# Patient Record
Sex: Male | Born: 1940
Health system: Southern US, Community
[De-identification: ages and names within clinical notes are randomized; demographics above are authoritative.]

## PROBLEM LIST (undated history)

## (undated) DIAGNOSIS — I10 Essential (primary) hypertension: Secondary | ICD-10-CM

## (undated) DIAGNOSIS — M1990 Unspecified osteoarthritis, unspecified site: Secondary | ICD-10-CM

## (undated) DIAGNOSIS — E119 Type 2 diabetes mellitus without complications: Secondary | ICD-10-CM

## (undated) DIAGNOSIS — N289 Disorder of kidney and ureter, unspecified: Secondary | ICD-10-CM

## (undated) DIAGNOSIS — K760 Fatty (change of) liver, not elsewhere classified: Secondary | ICD-10-CM

## (undated) DIAGNOSIS — G1221 Amyotrophic lateral sclerosis: Secondary | ICD-10-CM

## (undated) DIAGNOSIS — E785 Hyperlipidemia, unspecified: Secondary | ICD-10-CM

## (undated) HISTORY — DX: Type 2 diabetes mellitus without complications: E11.9

## (undated) HISTORY — PX: EYE SURGERY: SHX253

## (undated) HISTORY — DX: Essential (primary) hypertension: I10

## (undated) HISTORY — DX: Hyperlipidemia, unspecified: E78.5

## (undated) HISTORY — PX: OTHER SURGICAL HISTORY: SHX169

## (undated) HISTORY — DX: Disorder of kidney and ureter, unspecified: N28.9

## (undated) HISTORY — PX: COLONOSCOPY W/ POLYPECTOMY: SHX1380

---
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
## 2015-04-02 LAB — HEMOGLOBIN A1C: Hgb A1c MFr Bld: 8.2 % — AB (ref 4.0–6.0)

## 2015-05-13 ENCOUNTER — Ambulatory Visit (INDEPENDENT_AMBULATORY_CARE_PROVIDER_SITE_OTHER): Payer: Medicare Other | Admitting: "Endocrinology

## 2015-05-13 ENCOUNTER — Encounter: Payer: Self-pay | Admitting: "Endocrinology

## 2015-05-13 VITALS — BP 142/82 | HR 64 | Ht 70.5 in | Wt 201.0 lb

## 2015-05-13 DIAGNOSIS — I10 Essential (primary) hypertension: Secondary | ICD-10-CM

## 2015-05-13 DIAGNOSIS — N183 Chronic kidney disease, stage 3 unspecified: Secondary | ICD-10-CM | POA: Insufficient documentation

## 2015-05-13 DIAGNOSIS — E785 Hyperlipidemia, unspecified: Secondary | ICD-10-CM

## 2015-05-13 DIAGNOSIS — E1122 Type 2 diabetes mellitus with diabetic chronic kidney disease: Secondary | ICD-10-CM | POA: Insufficient documentation

## 2015-05-13 DIAGNOSIS — E782 Mixed hyperlipidemia: Secondary | ICD-10-CM | POA: Insufficient documentation

## 2015-05-13 NOTE — Patient Instructions (Signed)

## 2015-05-13 NOTE — Progress Notes (Signed)
Subjective:    Patient ID: John Bishop, male    DOB: September 13, 1940. Patient is being seen in consultation for management of diabetes requested by  Celedonio Savage, MD  Past Medical History  Diagnosis Date  . Diabetes mellitus, type II ([place])   . Hypertension   . Hyperlipidemia   . Kidney disease    No past surgical history on file. Social History   Social History  . Marital Status: Married    Spouse Name: N/A  . Number of Children: N/A  . Years of Education: N/A   Social History Main Topics  . Smoking status: Former Research scientist (life sciences)  . Smokeless tobacco: Not on file  . Alcohol Use: No  . Drug Use: No  . Sexual Activity: Not on file   Other Topics Concern  . Not on file   Social History Narrative  . No narrative on file   Outpatient Encounter Prescriptions as of 05/13/2015  Medication Sig  . amLODipine (NORVASC) 5 MG tablet Take 5 mg by mouth daily.  Marland Kitchen aspirin 81 MG tablet Take 81 mg by mouth daily.  Marland Kitchen atorvastatin (LIPITOR) 10 MG tablet Take 10 mg by mouth daily.  . Cholecalciferol (VITAMIN D3) 2000 UNITS TABS Take by mouth.  . furosemide (LASIX) 20 MG tablet Take 20 mg by mouth daily.  . Insulin Glargine (LANTUS SOLOSTAR) 100 UNIT/ML Solostar Pen Inject 40 Units into the skin at bedtime.  Marland Kitchen losartan-hydrochlorothiazide (HYZAAR) 100-25 MG tablet Take 1 tablet by mouth daily.  . metoprolol tartrate (LOPRESSOR) 25 MG tablet Take 25 mg by mouth 2 (two) times daily.  . Omega-3 Fatty Acids (FISH OIL) 1200 MG CAPS Take by mouth.  . vitamin C (ASCORBIC ACID) 500 MG tablet Take 500 mg by mouth daily.   No facility-administered encounter medications on file as of 05/13/2015.   ALLERGIES: No Known Allergies VACCINATION STATUS:  There is no immunization history on file for this patient.  Diabetes He presents for his initial diabetic visit. He has type 2 diabetes mellitus. Onset time: He was diagnosed at approximate age of 23 years. His disease course has been worsening. There are no  hypoglycemic associated symptoms. Pertinent negatives for hypoglycemia include no confusion, headaches, pallor or seizures. Associated symptoms include polydipsia and polyuria. Pertinent negatives for diabetes include no chest pain, no fatigue, no polyphagia and no weakness. There are no hypoglycemic complications. Symptoms are worsening. Diabetic complications include nephropathy. Pertinent negatives for diabetic complications include no CVA, heart disease or retinopathy. Risk factors for coronary artery disease include diabetes mellitus, dyslipidemia, male sex and tobacco exposure. Current diabetic treatment includes insulin injections and oral agent (dual therapy). He is compliant with treatment most of the time. His weight is stable. He is following a generally healthy diet. He has not had a previous visit with a dietitian. He participates in exercise intermittently. Home blood sugar record trend: He did not bring any meter nor log to review. An ACE inhibitor/angiotensin II receptor blocker is being taken. Eye exam is current.  Hypertension This is a chronic problem. The current episode started more than 1 year ago. Pertinent negatives include no chest pain, headaches, neck pain, palpitations or shortness of breath. Risk factors for coronary artery disease include diabetes mellitus, dyslipidemia, male gender and smoking/tobacco exposure. Past treatments include angiotensin blockers. There is no history of CVA or retinopathy.  Hyperlipidemia This is a chronic problem. The current episode started more than 1 year ago. Pertinent negatives include no chest pain, myalgias or shortness  of breath. Current antihyperlipidemic treatment includes statins. Risk factors for coronary artery disease include dyslipidemia, diabetes mellitus, hypertension and male sex.     Review of Systems  Constitutional: Negative for fatigue and unexpected weight change.  HENT: Negative for dental problem, mouth sores and trouble  swallowing.   Eyes: Negative for visual disturbance.  Respiratory: Negative for cough, choking, chest tightness, shortness of breath and wheezing.   Cardiovascular: Negative for chest pain, palpitations and leg swelling.  Gastrointestinal: Negative for nausea, vomiting, abdominal pain, diarrhea, constipation and abdominal distention.  Endocrine: Positive for polydipsia and polyuria. Negative for polyphagia.  Genitourinary: Negative for dysuria, urgency, hematuria and flank pain.  Musculoskeletal: Negative for myalgias, back pain, gait problem and neck pain.  Skin: Negative for pallor, rash and wound.  Neurological: Negative for seizures, syncope, weakness, numbness and headaches.  Psychiatric/Behavioral: Negative.  Negative for confusion and dysphoric mood.    Objective:    BP 142/82 mmHg  Pulse 64  Ht 5' 10.5" (1.791 m)  Wt 201 lb (91.173 kg)  BMI 28.42 kg/m2  SpO2 100%  Wt Readings from Last 3 Encounters:  05/13/15 201 lb (91.173 kg)    Physical Exam  Constitutional: He is oriented to person, place, and time. He appears well-developed and well-nourished. He is cooperative. No distress.  HENT:  Head: Normocephalic and atraumatic.  Eyes: EOM are normal.  Neck: Normal range of motion. Neck supple. No tracheal deviation present. No thyromegaly present.  Cardiovascular: Normal rate, S1 normal, S2 normal and normal heart sounds.  Exam reveals no gallop.   No murmur heard. Pulses:      Dorsalis pedis pulses are 1+ on the right side, and 1+ on the left side.       Posterior tibial pulses are 1+ on the right side, and 1+ on the left side.  Pulmonary/Chest: Breath sounds normal. No respiratory distress. He has no wheezes.  Abdominal: Soft. Bowel sounds are normal. He exhibits no distension. There is no tenderness. There is no guarding and no CVA tenderness.  Musculoskeletal: He exhibits no edema.       Right shoulder: He exhibits no swelling and no deformity.  Neurological: He is alert  and oriented to person, place, and time. He has normal strength and normal reflexes. No cranial nerve deficit or sensory deficit. Gait normal.  Skin: Skin is warm and dry. No rash noted. No cyanosis. Nails show no clubbing.  Psychiatric: He has a normal mood and affect. His speech is normal and behavior is normal. Judgment and thought content normal. Cognition and memory are normal.    Results for orders placed or performed in visit on 05/13/15  Hemoglobin A1c  Result Value Ref Range   Hgb A1c MFr Bld 8.2 (A) 4.0 - 6.0 %   Complete Blood Count (Most recent): No results found for: WBC, HGB, HCT, MCV, PLT Chemistry (most recent): No results found for: NA, K, CL, CO2, BUN, CREATININE, GLUF Diabetic Labs (most recent): Lab Results  Component Value Date   HGBA1C 8.2* 04/02/2015   Lipid profile (most recent): No results found for: TRIG, CHOL       Assessment & Plan:   1. Diabetes mellitus with stage 3 chronic kidney disease ([place])  - Patient has currently uncontrolled symptomatic type 2 DM since  74 years of age,  with most recent A1c of 8.2 %. Recent labs reviewed.   -His diabetes is complicated by stage III CK D and patient remains at a high risk for more  acute and chronic complications of diabetes which include CAD, CVA, CKD, retinopathy, and neuropathy. These are all discussed in detail with the patient.  - I have counseled the patient on diet management and weight loss, by adopting a carbohydrate restricted/protein rich diet.  - Suggestion is made for patient to avoid simple carbohydrates   from their diet including Cakes , Desserts, Ice Cream,  Soda (  diet and regular) , Sweet Tea , Candies,  Chips, Cookies, Artificial Sweeteners,   and "Sugar-free" Products . This will help patient to have stable blood glucose profile and potentially avoid unintended weight gain.  - I encouraged the patient to switch to  unprocessed or minimally processed complex starch and increased protein  intake (animal or plant source), fruits, and vegetables.  - Patient is advised to stick to a routine mealtimes to eat 3 meals  a day and avoid unnecessary snacks ( to snack only to correct hypoglycemia).  - The patient will be scheduled with Jearld Fenton, RDN, CDE for individualized DM education.  - I have approached patient with the following individualized plan to manage diabetes and patient agrees:   - I  will proceed to increase his basal insulin Lantus to 40 units QHS,  associated with strict monitoring of glucose  AC and HS. -He may require bolus insulin depending on his blood glucose readings. -Patient is encouraged to call clinic for blood glucose levels less than 70 or above 300 mg /dl. -Patient is not a candidate for metformin andSGLT2 inhibitors due to CKD.  - Patient will be considered for incretin therapy as appropriate next visit. - Patient specific target  A1c;  LDL, HDL, Triglycerides, and  Waist Circumference were discussed in detail.  2) BP/HTN: uncontrolled. Continue current medications including ACEI/ARB. 3) Lipids/HPL:  Controlled unknown, continue statins. 4)  Weight/Diet: CDE Consult will be initiated , exercise, and detailed carbohydrates information provided.  5) Chronic Care/Health Maintenance:  -Patient is on ACEI/ARB and Statin medications and encouraged to continue to follow up with Ophthalmology, Podiatrist at least yearly or according to recommendations, and advised to   stay away from smoking. I have recommended yearly flu vaccine and pneumonia vaccination at least every 5 years; moderate intensity exercise for up to 150 minutes weekly; and  sleep for at least 7 hours a day.  - 60 minutes of time was spent on the care of this patient , 50% of which was applied for counseling on diabetes complications and their preventions.  - Patient to bring meter and  blood glucose logs during their next visit.   - I advised patient to maintain close follow up with  Celedonio Savage, MD for primary care needs.  Follow up plan: - Return in about 1 week (around 05/20/2015) for diabetes, high blood pressure, high cholesterol, follow up with meter and logs- no labs.  Glade Lloyd, MD Phone: [phone]  Fax: [phone]   05/13/2015, 7:30 PM

## 2015-05-20 ENCOUNTER — Encounter: Payer: Self-pay | Admitting: "Endocrinology

## 2015-05-20 ENCOUNTER — Ambulatory Visit (INDEPENDENT_AMBULATORY_CARE_PROVIDER_SITE_OTHER): Payer: Medicare Other | Admitting: "Endocrinology

## 2015-05-20 VITALS — BP 144/76 | HR 73 | Ht 70.5 in | Wt 201.0 lb

## 2015-05-20 DIAGNOSIS — N183 Chronic kidney disease, stage 3 unspecified: Secondary | ICD-10-CM

## 2015-05-20 DIAGNOSIS — I10 Essential (primary) hypertension: Secondary | ICD-10-CM | POA: Diagnosis not present

## 2015-05-20 DIAGNOSIS — E1122 Type 2 diabetes mellitus with diabetic chronic kidney disease: Secondary | ICD-10-CM

## 2015-05-20 DIAGNOSIS — E785 Hyperlipidemia, unspecified: Secondary | ICD-10-CM | POA: Diagnosis not present

## 2015-05-20 NOTE — Patient Instructions (Signed)

## 2015-05-20 NOTE — Progress Notes (Signed)
Subjective:    Patient ID: John Bishop, male    DOB: Oct 10, 1940. Patient is being seen in consultation for management of diabetes requested by  Celedonio Savage, MD  Past Medical History  Diagnosis Date  . Diabetes mellitus, type II ([place])   . Hypertension   . Hyperlipidemia   . Kidney disease    History reviewed. No pertinent past surgical history. Social History   Social History  . Marital Status: Married    Spouse Name: N/A  . Number of Children: N/A  . Years of Education: N/A   Social History Main Topics  . Smoking status: Former Research scientist (life sciences)  . Smokeless tobacco: None  . Alcohol Use: No  . Drug Use: No  . Sexual Activity: Not Asked   Other Topics Concern  . None   Social History Narrative   Outpatient Encounter Prescriptions as of 05/20/2015  Medication Sig  . amLODipine (NORVASC) 5 MG tablet Take 5 mg by mouth daily.  Marland Kitchen aspirin 81 MG tablet Take 81 mg by mouth daily.  Marland Kitchen atorvastatin (LIPITOR) 10 MG tablet Take 10 mg by mouth daily.  . Cholecalciferol (VITAMIN D3) 2000 UNITS TABS Take by mouth.  . furosemide (LASIX) 20 MG tablet Take 20 mg by mouth daily.  . Insulin Glargine (LANTUS SOLOSTAR) 100 UNIT/ML Solostar Pen Inject 40 Units into the skin at bedtime.  Marland Kitchen losartan-hydrochlorothiazide (HYZAAR) 100-25 MG tablet Take 1 tablet by mouth daily.  . metoprolol tartrate (LOPRESSOR) 25 MG tablet Take 25 mg by mouth 2 (two) times daily.  . Omega-3 Fatty Acids (FISH OIL) 1200 MG CAPS Take by mouth.  . vitamin C (ASCORBIC ACID) 500 MG tablet Take 500 mg by mouth daily.   No facility-administered encounter medications on file as of 05/20/2015.   ALLERGIES: No Known Allergies VACCINATION STATUS:  There is no immunization history on file for this patient.  Diabetes He presents for his follow-up diabetic visit. He has type 2 diabetes mellitus. Onset time: He was diagnosed at approximate age of 52 years. His disease course has been improving. There are no hypoglycemic  associated symptoms. Pertinent negatives for hypoglycemia include no confusion, headaches, pallor or seizures. Pertinent negatives for diabetes include no chest pain, no fatigue, no polydipsia, no polyphagia, no polyuria and no weakness. There are no hypoglycemic complications. Symptoms are improving. Diabetic complications include nephropathy. Pertinent negatives for diabetic complications include no CVA, heart disease or retinopathy. Risk factors for coronary artery disease include diabetes mellitus, dyslipidemia, male sex and tobacco exposure. Current diabetic treatment includes insulin injections and oral agent (dual therapy). He is compliant with treatment most of the time. His weight is stable. He is following a generally healthy diet. He has not had a previous visit with a dietitian. He participates in exercise intermittently. His breakfast blood glucose range is generally 140-180 mg/dl. His lunch blood glucose range is generally 140-180 mg/dl. His dinner blood glucose range is generally 140-180 mg/dl. His overall blood glucose range is 140-180 mg/dl. An ACE inhibitor/angiotensin II receptor blocker is being taken. Eye exam is current.  Hypertension This is a chronic problem. The current episode started more than 1 year ago. Pertinent negatives include no chest pain, headaches, neck pain, palpitations or shortness of breath. Risk factors for coronary artery disease include diabetes mellitus, dyslipidemia, male gender and smoking/tobacco exposure. Past treatments include angiotensin blockers. There is no history of CVA or retinopathy.  Hyperlipidemia This is a chronic problem. The current episode started more than 1 year ago. Pertinent  negatives include no chest pain, myalgias or shortness of breath. Current antihyperlipidemic treatment includes statins. Risk factors for coronary artery disease include dyslipidemia, diabetes mellitus, hypertension and male sex.     Review of Systems  Constitutional:  Negative for fatigue and unexpected weight change.  HENT: Negative for dental problem, mouth sores and trouble swallowing.   Eyes: Negative for visual disturbance.  Respiratory: Negative for cough, choking, chest tightness, shortness of breath and wheezing.   Cardiovascular: Negative for chest pain, palpitations and leg swelling.  Gastrointestinal: Negative for nausea, vomiting, abdominal pain, diarrhea, constipation and abdominal distention.  Endocrine: Negative for polydipsia, polyphagia and polyuria.  Genitourinary: Negative for dysuria, urgency, hematuria and flank pain.  Musculoskeletal: Negative for myalgias, back pain, gait problem and neck pain.  Skin: Negative for pallor, rash and wound.  Neurological: Negative for seizures, syncope, weakness, numbness and headaches.  Psychiatric/Behavioral: Negative.  Negative for confusion and dysphoric mood.    Objective:    BP 144/76 mmHg  Pulse 73  Ht 5' 10.5" (1.791 m)  Wt 201 lb (91.173 kg)  BMI 28.42 kg/m2  SpO2 99%  Wt Readings from Last 3 Encounters:  05/20/15 201 lb (91.173 kg)  05/13/15 201 lb (91.173 kg)    Physical Exam  Constitutional: He is oriented to person, place, and time. He appears well-developed and well-nourished. He is cooperative. No distress.  HENT:  Head: Normocephalic and atraumatic.  Eyes: EOM are normal.  Neck: Normal range of motion. Neck supple. No tracheal deviation present. No thyromegaly present.  Cardiovascular: Normal rate, S1 normal, S2 normal and normal heart sounds.  Exam reveals no gallop.   No murmur heard. Pulses:      Dorsalis pedis pulses are 1+ on the right side, and 1+ on the left side.       Posterior tibial pulses are 1+ on the right side, and 1+ on the left side.  Pulmonary/Chest: Breath sounds normal. No respiratory distress. He has no wheezes.  Abdominal: Soft. Bowel sounds are normal. He exhibits no distension. There is no tenderness. There is no guarding and no CVA tenderness.   Musculoskeletal: He exhibits no edema.       Right shoulder: He exhibits no swelling and no deformity.  Neurological: He is alert and oriented to person, place, and time. He has normal strength and normal reflexes. No cranial nerve deficit or sensory deficit. Gait normal.  Skin: Skin is warm and dry. No rash noted. No cyanosis. Nails show no clubbing.  Psychiatric: He has a normal mood and affect. His speech is normal and behavior is normal. Judgment and thought content normal. Cognition and memory are normal.    Results for orders placed or performed in visit on 05/13/15  Hemoglobin A1c  Result Value Ref Range   Hgb A1c MFr Bld 8.2 (A) 4.0 - 6.0 %   Complete Blood Count (Most recent): No results found for: WBC, HGB, HCT, MCV, PLT Chemistry (most recent): No results found for: NA, K, CL, CO2, BUN, CREATININE, GLUF Diabetic Labs (most recent): Lab Results  Component Value Date   HGBA1C 8.2* 04/02/2015    Assessment & Plan:   1. Diabetes mellitus with stage 3 chronic kidney disease ([place])  - Patient has currently uncontrolled symptomatic type 2 DM since  74 years of age,  with most recent A1c of 8.2 %.  -He came with near target blood glucose profile since last visit.  -His diabetes is complicated by stage III CK D and patient remains at  a high risk for more acute and chronic complications of diabetes which include CAD, CVA, CKD, retinopathy, and neuropathy. These are all discussed in detail with the patient.  - I have counseled the patient on diet management and weight loss, by adopting a carbohydrate restricted/protein rich diet.  - Suggestion is made for patient to avoid simple carbohydrates   from their diet including Cakes , Desserts, Ice Cream,  Soda (  diet and regular) , Sweet Tea , Candies,  Chips, Cookies, Artificial Sweeteners,   and "Sugar-free" Products . This will help patient to have stable blood glucose profile and potentially avoid unintended weight gain.  - I  encouraged the patient to switch to  unprocessed or minimally processed complex starch and increased protein intake (animal or plant source), fruits, and vegetables.  - Patient is advised to stick to a routine mealtimes to eat 3 meals  a day and avoid unnecessary snacks ( to snack only to correct hypoglycemia).  - The patient will be scheduled with Jearld Fenton, RDN, CDE for individualized DM education.  - I have approached patient with the following individualized plan to manage diabetes and patient agrees:   - I  will continue on basal insulin Lantus to 40 units QHS,  associated with strict monitoring of glucose  AC and HS. -He will not require bolus insulin depending on his blood glucose readings. -Patient is encouraged to call clinic for blood glucose levels less than 70 or above 300 mg /dl. -Patient is not a candidate for metformin andSGLT2 inhibitors due to CKD.  - Patient will be considered for incretin therapy as appropriate next visit. - Patient specific target  A1c;  LDL, HDL, Triglycerides, and  Waist Circumference were discussed in detail.  2) BP/HTN: uncontrolled. Continue current medications including ACEI/ARB. 3) Lipids/HPL:  Controlled unknown, continue statins. 4)  Weight/Diet: CDE Consult is  initiated , exercise, and detailed carbohydrates information provided.  5) Chronic Care/Health Maintenance:  -Patient is on ACEI/ARB and Statin medications and encouraged to continue to follow up with Ophthalmology, Podiatrist at least yearly or according to recommendations, and advised to   stay away from smoking. I have recommended yearly flu vaccine and pneumonia vaccination at least every 5 years; moderate intensity exercise for up to 150 minutes weekly; and  sleep for at least 7 hours a day.  -45 minutes of time was spent on the care of this patient , 50% of which was applied for counseling on diabetes complications and their preventions.  - Patient to bring meter and  blood  glucose logs during their next visit.   - I advised patient to maintain close follow up with Celedonio Savage, MD for primary care needs.  Follow up plan: - Return in about 10 weeks (around 07/29/2015) for diabetes, high blood pressure, high cholesterol, follow up with pre-visit labs, meter, and logs.  Glade Lloyd, MD Phone: [phone]  Fax: [phone]   05/20/2015, 4:51 PM

## 2015-05-21 ENCOUNTER — Encounter: Payer: Medicare Other | Attending: "Endocrinology | Admitting: *Deleted

## 2015-05-21 ENCOUNTER — Encounter: Payer: Self-pay | Admitting: *Deleted

## 2015-05-21 VITALS — Ht 70.0 in | Wt 203.0 lb

## 2015-05-21 DIAGNOSIS — N183 Chronic kidney disease, stage 3 unspecified: Secondary | ICD-10-CM

## 2015-05-21 DIAGNOSIS — E119 Type 2 diabetes mellitus without complications: Secondary | ICD-10-CM | POA: Diagnosis present

## 2015-05-21 DIAGNOSIS — E1122 Type 2 diabetes mellitus with diabetic chronic kidney disease: Secondary | ICD-10-CM

## 2015-05-21 NOTE — Patient Instructions (Addendum)
Plan:  Aim for 2-3 Carb Choices per meal (30-45 grams) +/- 1 either way  Include protein in moderation with your meals and snacks Consider reading food labels for Total Carbohydrate and Fat Grams of foods Continue your activity level  daily as tolerated Consider checking BG at alternate times per day to include fasting and 2 hours after a meal as directed by MD  Continue taking medication as directed by MD  Consider [employer] vs butter or margarine Try to balance your foods keeping in mind protein, fat and carbohydrates.... Don't let it get the best of you.Marland KitchenMarland KitchenBaby Steps  Always have with you: glucometer, water, recovery product, carb/protein snack

## 2015-05-21 NOTE — Progress Notes (Signed)
Diabetes Self-Management Education  Visit Type: First/Initial  Appt. Start Time: 1030 Appt. End Time: 1200  05/21/2015  John Bishop, identified by name and date of birth, is a 74 y.o. male with a diagnosis of Diabetes: Type 2. John Bishop presents with his wife [name]. John Bishop was seen by Dr. Dorris Fetch yesterday. 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 He and his wife remain very active through the [employer] and civic activities. John. [name] is highly motivated to remain in good health and make any beneficial behavior modifications recommended.  ASSESSMENT  Height 5\' 10"  (1.778 m), weight 203 lb (92.08 kg). Body mass index is 29.13 kg/(m^2).      Diabetes Self-Management Education - 05/21/15 1357    Visit Information   Visit Type First/Initial   Initial Visit   Diabetes Type Type 2   Are you currently following a meal plan? Yes   What type of meal plan do you follow? 3 meals per day no snacks   Are you taking your medications as prescribed? Yes   Health Coping   How would you rate your overall health? Good   Psychosocial Assessment   Patient Belief/Attitude about Diabetes Motivated to manage diabetes   Self-care barriers None   Self-management support Doctor's office;Family;CDE visits   Other persons present Patient;Spouse/SO   Patient Concerns Nutrition/Meal planning;Healthy Lifestyle;Glycemic Control   Special Needs None   Preferred Learning Style No preference indicated   Learning Readiness Change in progress   How often do you need to have someone help you when you read instructions, pamphlets, or other written materials from your doctor or pharmacy? 1 - Never   Complications   How often do you check your blood sugar? 1-2 times/day   Fasting Blood glucose range (mg/dL) 70-129;130-179  108-269   Postprandial Blood glucose range (mg/dL) 70-129;130-179  88-16   Number of hypoglycemic episodes per month 0   Number of hyperglycemic episodes per week 1   Have you  had a dilated eye exam in the past 12 months? Yes   Have you had a dental exam in the past 12 months? Yes   Are you checking your feet? Yes   How many days per week are you checking your feet? 7   Dietary Intake   Breakfast 2 eggs, 2 bacon, coffee with whole milk   Exercise   Exercise Type Moderate (swimming / aerobic walking);Light (walking / raking leaves)   How many days per week to you exercise? 5   How many minutes per day do you exercise? 60   Total minutes per week of exercise 300   Patient Education   Previous Diabetes Education Yes (please comment)  16 years ago at time of diagnosis   Disease state  Factors that contribute to the development of diabetes   Nutrition management  Role of diet in the treatment of diabetes and the relationship between the three main macronutrients and blood glucose level;Food label reading, portion sizes and measuring food.;Carbohydrate counting;Reviewed blood glucose goals for pre and post meals and how to evaluate the patients' food intake on their blood glucose level.;Information on hints to eating out and maintain blood glucose control.;Meal options for control of blood glucose level and chronic complications.   Physical activity and exercise  Role of exercise on diabetes management, blood pressure control and cardiac health.   Medications Reviewed patients medication for diabetes, action, purpose, timing of dose and side effects.   Monitoring Purpose  and frequency of SMBG.   Acute complications Taught treatment of hypoglycemia - the 15 rule.   Chronic complications Relationship between chronic complications and blood glucose control;Assessed and discussed foot care and prevention of foot problems;Dental care;Retinopathy and reason for yearly dilated eye exams;Lipid levels, blood glucose control and heart disease   Psychosocial adjustment Identified and addressed patients feelings and concerns about diabetes   Personal strategies to promote health  Lifestyle issues that need to be addressed for better diabetes care   Individualized Goals (developed by patient)   Nutrition General guidelines for healthy choices and portions discussed   Physical Activity Exercise 5-7 days per week;30 minutes per day   Medications take my medication as prescribed   Monitoring  test blood glucose pre and post meals as discussed  alternate FBS and 2hpp   Reducing Risk do foot checks daily   Outcomes   Expected Outcomes Demonstrated interest in learning. Expect positive outcomes   Future DMSE PRN;Other (comment)  Any follow up will be with Jearld Fenton RD, CDE with Dr. Dorris Fetch   Program Status Completed      Individualized Plan for Diabetes Self-Management Training:   Learning Objective:  Patient will have a greater understanding of diabetes self-management. Patient education plan is to attend individual and/or group sessions per assessed needs and concerns.   Plan:   Patient Instructions  Plan:  Aim for 2-3 Carb Choices per meal (30-45 grams) +/- 1 either way  Include protein in moderation with your meals and snacks Consider reading food labels for Total Carbohydrate and Fat Grams of foods Continue your activity level  daily as tolerated Consider checking BG at alternate times per day to include fasting and 2 hours after a meal as directed by MD  Continue taking medication as directed by MD  Consider [employer] vs butter or margarine Try to balance your foods keeping in mind protein, fat and carbohydrates.... Don't let it get the best of you.Marland KitchenMarland KitchenBaby Steps  Always have with you: glucometer, water, recovery product, carb/protein snack   Expected Outcomes:  Demonstrated interest in learning. Expect positive outcomes  Education material provided: Living Well with Diabetes, Meal plan card and My Plate, Choose a Meal [facility])  If problems or questions, patient to contact team via:  Phone  Future DSME appointment: PRN,  Other (comment) (Any follow up will be with Jearld Fenton RD, CDE with Dr. Dorris Fetch)

## 2015-06-11 DIAGNOSIS — M25652 Stiffness of left hip, not elsewhere classified: Secondary | ICD-10-CM | POA: Diagnosis not present

## 2015-06-11 DIAGNOSIS — M25452 Effusion, left hip: Secondary | ICD-10-CM | POA: Diagnosis not present

## 2015-06-11 DIAGNOSIS — M25551 Pain in right hip: Secondary | ICD-10-CM | POA: Diagnosis not present

## 2015-06-11 DIAGNOSIS — M25552 Pain in left hip: Secondary | ICD-10-CM | POA: Diagnosis not present

## 2015-06-11 DIAGNOSIS — M62838 Other muscle spasm: Secondary | ICD-10-CM | POA: Diagnosis not present

## 2015-06-11 DIAGNOSIS — M545 Low back pain: Secondary | ICD-10-CM | POA: Diagnosis not present

## 2015-06-13 DIAGNOSIS — M25452 Effusion, left hip: Secondary | ICD-10-CM | POA: Diagnosis not present

## 2015-06-13 DIAGNOSIS — M25552 Pain in left hip: Secondary | ICD-10-CM | POA: Diagnosis not present

## 2015-06-13 DIAGNOSIS — M62838 Other muscle spasm: Secondary | ICD-10-CM | POA: Diagnosis not present

## 2015-06-13 DIAGNOSIS — M25551 Pain in right hip: Secondary | ICD-10-CM | POA: Diagnosis not present

## 2015-06-13 DIAGNOSIS — M545 Low back pain: Secondary | ICD-10-CM | POA: Diagnosis not present

## 2015-06-13 DIAGNOSIS — M25652 Stiffness of left hip, not elsewhere classified: Secondary | ICD-10-CM | POA: Diagnosis not present

## 2015-06-18 DIAGNOSIS — M25452 Effusion, left hip: Secondary | ICD-10-CM | POA: Diagnosis not present

## 2015-06-18 DIAGNOSIS — M545 Low back pain: Secondary | ICD-10-CM | POA: Diagnosis not present

## 2015-06-18 DIAGNOSIS — M25652 Stiffness of left hip, not elsewhere classified: Secondary | ICD-10-CM | POA: Diagnosis not present

## 2015-06-18 DIAGNOSIS — M62838 Other muscle spasm: Secondary | ICD-10-CM | POA: Diagnosis not present

## 2015-06-18 DIAGNOSIS — M25552 Pain in left hip: Secondary | ICD-10-CM | POA: Diagnosis not present

## 2015-06-18 DIAGNOSIS — M25551 Pain in right hip: Secondary | ICD-10-CM | POA: Diagnosis not present

## 2015-06-20 DIAGNOSIS — M545 Low back pain: Secondary | ICD-10-CM | POA: Diagnosis not present

## 2015-06-20 DIAGNOSIS — M25552 Pain in left hip: Secondary | ICD-10-CM | POA: Diagnosis not present

## 2015-06-20 DIAGNOSIS — M25551 Pain in right hip: Secondary | ICD-10-CM | POA: Diagnosis not present

## 2015-06-20 DIAGNOSIS — M25652 Stiffness of left hip, not elsewhere classified: Secondary | ICD-10-CM | POA: Diagnosis not present

## 2015-06-20 DIAGNOSIS — M25452 Effusion, left hip: Secondary | ICD-10-CM | POA: Diagnosis not present

## 2015-06-20 DIAGNOSIS — M62838 Other muscle spasm: Secondary | ICD-10-CM | POA: Diagnosis not present

## 2015-06-25 DIAGNOSIS — M25652 Stiffness of left hip, not elsewhere classified: Secondary | ICD-10-CM | POA: Diagnosis not present

## 2015-06-25 DIAGNOSIS — M25551 Pain in right hip: Secondary | ICD-10-CM | POA: Diagnosis not present

## 2015-06-25 DIAGNOSIS — M25452 Effusion, left hip: Secondary | ICD-10-CM | POA: Diagnosis not present

## 2015-06-25 DIAGNOSIS — M62838 Other muscle spasm: Secondary | ICD-10-CM | POA: Diagnosis not present

## 2015-06-25 DIAGNOSIS — M25552 Pain in left hip: Secondary | ICD-10-CM | POA: Diagnosis not present

## 2015-06-25 DIAGNOSIS — M545 Low back pain: Secondary | ICD-10-CM | POA: Diagnosis not present

## 2015-06-27 DIAGNOSIS — M62838 Other muscle spasm: Secondary | ICD-10-CM | POA: Diagnosis not present

## 2015-06-27 DIAGNOSIS — M25652 Stiffness of left hip, not elsewhere classified: Secondary | ICD-10-CM | POA: Diagnosis not present

## 2015-06-27 DIAGNOSIS — M25552 Pain in left hip: Secondary | ICD-10-CM | POA: Diagnosis not present

## 2015-06-27 DIAGNOSIS — M545 Low back pain: Secondary | ICD-10-CM | POA: Diagnosis not present

## 2015-06-27 DIAGNOSIS — M25452 Effusion, left hip: Secondary | ICD-10-CM | POA: Diagnosis not present

## 2015-06-27 DIAGNOSIS — M25551 Pain in right hip: Secondary | ICD-10-CM | POA: Diagnosis not present

## 2015-07-02 DIAGNOSIS — M25551 Pain in right hip: Secondary | ICD-10-CM | POA: Diagnosis not present

## 2015-07-02 DIAGNOSIS — M25652 Stiffness of left hip, not elsewhere classified: Secondary | ICD-10-CM | POA: Diagnosis not present

## 2015-07-02 DIAGNOSIS — M25452 Effusion, left hip: Secondary | ICD-10-CM | POA: Diagnosis not present

## 2015-07-02 DIAGNOSIS — M545 Low back pain: Secondary | ICD-10-CM | POA: Diagnosis not present

## 2015-07-02 DIAGNOSIS — M62838 Other muscle spasm: Secondary | ICD-10-CM | POA: Diagnosis not present

## 2015-07-02 DIAGNOSIS — M25552 Pain in left hip: Secondary | ICD-10-CM | POA: Diagnosis not present

## 2015-07-04 DIAGNOSIS — M545 Low back pain: Secondary | ICD-10-CM | POA: Diagnosis not present

## 2015-07-04 DIAGNOSIS — M25552 Pain in left hip: Secondary | ICD-10-CM | POA: Diagnosis not present

## 2015-07-04 DIAGNOSIS — M62838 Other muscle spasm: Secondary | ICD-10-CM | POA: Diagnosis not present

## 2015-07-04 DIAGNOSIS — M25551 Pain in right hip: Secondary | ICD-10-CM | POA: Diagnosis not present

## 2015-07-04 DIAGNOSIS — M25452 Effusion, left hip: Secondary | ICD-10-CM | POA: Diagnosis not present

## 2015-07-04 DIAGNOSIS — M25652 Stiffness of left hip, not elsewhere classified: Secondary | ICD-10-CM | POA: Diagnosis not present

## 2015-07-08 DIAGNOSIS — M25552 Pain in left hip: Secondary | ICD-10-CM | POA: Diagnosis not present

## 2015-07-08 DIAGNOSIS — M62838 Other muscle spasm: Secondary | ICD-10-CM | POA: Diagnosis not present

## 2015-07-08 DIAGNOSIS — M25551 Pain in right hip: Secondary | ICD-10-CM | POA: Diagnosis not present

## 2015-07-08 DIAGNOSIS — M25452 Effusion, left hip: Secondary | ICD-10-CM | POA: Diagnosis not present

## 2015-07-08 DIAGNOSIS — M545 Low back pain: Secondary | ICD-10-CM | POA: Diagnosis not present

## 2015-07-08 DIAGNOSIS — M25652 Stiffness of left hip, not elsewhere classified: Secondary | ICD-10-CM | POA: Diagnosis not present

## 2015-07-10 DIAGNOSIS — M25452 Effusion, left hip: Secondary | ICD-10-CM | POA: Diagnosis not present

## 2015-07-10 DIAGNOSIS — M25552 Pain in left hip: Secondary | ICD-10-CM | POA: Diagnosis not present

## 2015-07-10 DIAGNOSIS — M25551 Pain in right hip: Secondary | ICD-10-CM | POA: Diagnosis not present

## 2015-07-10 DIAGNOSIS — M25652 Stiffness of left hip, not elsewhere classified: Secondary | ICD-10-CM | POA: Diagnosis not present

## 2015-07-10 DIAGNOSIS — M62838 Other muscle spasm: Secondary | ICD-10-CM | POA: Diagnosis not present

## 2015-07-10 DIAGNOSIS — M545 Low back pain: Secondary | ICD-10-CM | POA: Diagnosis not present

## 2015-07-15 DIAGNOSIS — M545 Low back pain: Secondary | ICD-10-CM | POA: Diagnosis not present

## 2015-07-15 DIAGNOSIS — M25551 Pain in right hip: Secondary | ICD-10-CM | POA: Diagnosis not present

## 2015-07-15 DIAGNOSIS — M25452 Effusion, left hip: Secondary | ICD-10-CM | POA: Diagnosis not present

## 2015-07-15 DIAGNOSIS — M62838 Other muscle spasm: Secondary | ICD-10-CM | POA: Diagnosis not present

## 2015-07-15 DIAGNOSIS — M25552 Pain in left hip: Secondary | ICD-10-CM | POA: Diagnosis not present

## 2015-07-15 DIAGNOSIS — M25652 Stiffness of left hip, not elsewhere classified: Secondary | ICD-10-CM | POA: Diagnosis not present

## 2015-07-17 DIAGNOSIS — M25452 Effusion, left hip: Secondary | ICD-10-CM | POA: Diagnosis not present

## 2015-07-17 DIAGNOSIS — M25552 Pain in left hip: Secondary | ICD-10-CM | POA: Diagnosis not present

## 2015-07-17 DIAGNOSIS — M545 Low back pain: Secondary | ICD-10-CM | POA: Diagnosis not present

## 2015-07-17 DIAGNOSIS — M25652 Stiffness of left hip, not elsewhere classified: Secondary | ICD-10-CM | POA: Diagnosis not present

## 2015-07-17 DIAGNOSIS — M25551 Pain in right hip: Secondary | ICD-10-CM | POA: Diagnosis not present

## 2015-07-17 DIAGNOSIS — M62838 Other muscle spasm: Secondary | ICD-10-CM | POA: Diagnosis not present

## 2015-07-24 DIAGNOSIS — N183 Chronic kidney disease, stage 3 (moderate): Secondary | ICD-10-CM | POA: Diagnosis not present

## 2015-07-24 DIAGNOSIS — E1122 Type 2 diabetes mellitus with diabetic chronic kidney disease: Secondary | ICD-10-CM | POA: Diagnosis not present

## 2015-07-24 LAB — HEMOGLOBIN A1C: HEMOGLOBIN A1C: 8

## 2015-07-31 ENCOUNTER — Ambulatory Visit (INDEPENDENT_AMBULATORY_CARE_PROVIDER_SITE_OTHER): Payer: Medicare Other | Admitting: "Endocrinology

## 2015-07-31 ENCOUNTER — Encounter: Payer: Self-pay | Admitting: "Endocrinology

## 2015-07-31 VITALS — BP 135/79 | HR 70 | Ht 70.0 in | Wt 204.0 lb

## 2015-07-31 DIAGNOSIS — N183 Chronic kidney disease, stage 3 unspecified: Secondary | ICD-10-CM

## 2015-07-31 DIAGNOSIS — I10 Essential (primary) hypertension: Secondary | ICD-10-CM | POA: Diagnosis not present

## 2015-07-31 DIAGNOSIS — E1122 Type 2 diabetes mellitus with diabetic chronic kidney disease: Secondary | ICD-10-CM

## 2015-07-31 DIAGNOSIS — E785 Hyperlipidemia, unspecified: Secondary | ICD-10-CM | POA: Diagnosis not present

## 2015-07-31 MED ORDER — SITAGLIPTIN PHOSPHATE 25 MG PO TABS: 25.0000 mg | ORAL_TABLET | Freq: Every day | ORAL | Status: DC

## 2015-07-31 NOTE — Progress Notes (Signed)
Subjective:    Patient ID: John Bishop, male    DOB: 09-11-1940. Patient is here to follow-up for management of diabetes requested by  Celedonio Savage, MD  Past Medical History  Diagnosis Date  . Diabetes mellitus, type II ([place])   . Hypertension   . Hyperlipidemia   . Kidney disease    Past Surgical History  Procedure Laterality Date  . Eye surgery     Social History   Social History  . Marital Status: Married    Spouse Name: N/A  . Number of Children: N/A  . Years of Education: N/A   Social History Main Topics  . Smoking status: Former Research scientist (life sciences)  . Smokeless tobacco: None  . Alcohol Use: No  . Drug Use: No  . Sexual Activity: Not Asked   Other Topics Concern  . None   Social History Narrative   Outpatient Encounter Prescriptions as of 07/31/2015  Medication Sig  . amLODipine (NORVASC) 5 MG tablet Take 5 mg by mouth daily.  Marland Kitchen aspirin 81 MG tablet Take 81 mg by mouth daily.  Marland Kitchen atorvastatin (LIPITOR) 10 MG tablet Take 10 mg by mouth daily.  . Cholecalciferol (VITAMIN D3) 2000 UNITS TABS Take by mouth.  . furosemide (LASIX) 20 MG tablet Take 20 mg by mouth daily.  . Insulin Glargine (LANTUS SOLOSTAR) 100 UNIT/ML Solostar Pen Inject 46 Units into the skin at bedtime.  Marland Kitchen losartan-hydrochlorothiazide (HYZAAR) 100-25 MG tablet Take 1 tablet by mouth daily.  . metoprolol tartrate (LOPRESSOR) 25 MG tablet Take 25 mg by mouth 2 (two) times daily.  . Omega-3 Fatty Acids (FISH OIL) 1200 MG CAPS Take by mouth.  . vitamin C (ASCORBIC ACID) 500 MG tablet Take 500 mg by mouth daily.  . sitaGLIPtin (JANUVIA) 25 MG tablet Take 1 tablet (25 mg total) by mouth daily.   No facility-administered encounter medications on file as of 07/31/2015.   ALLERGIES: No Known Allergies VACCINATION STATUS:  There is no immunization history on file for this patient.  Diabetes He presents for his follow-up diabetic visit. He has type 2 diabetes mellitus. Onset time: He was diagnosed at approximate  age of 50 years. His disease course has been improving. There are no hypoglycemic associated symptoms. Pertinent negatives for hypoglycemia include no confusion, headaches, pallor or seizures. Pertinent negatives for diabetes include no chest pain, no fatigue, no polydipsia, no polyphagia, no polyuria and no weakness. There are no hypoglycemic complications. Symptoms are improving. Diabetic complications include nephropathy. Pertinent negatives for diabetic complications include no CVA, heart disease or retinopathy. Risk factors for coronary artery disease include diabetes mellitus, dyslipidemia, male sex and tobacco exposure. Current diabetic treatment includes insulin injections and oral agent (dual therapy). He is compliant with treatment most of the time. His weight is stable. He is following a generally healthy diet. He has not had a previous visit with a dietitian. He participates in exercise intermittently. His breakfast blood glucose range is generally 140-180 mg/dl. An ACE inhibitor/angiotensin II receptor blocker is being taken. Eye exam is current.  Hypertension This is a chronic problem. The current episode started more than 1 year ago. Pertinent negatives include no chest pain, headaches, neck pain, palpitations or shortness of breath. Risk factors for coronary artery disease include diabetes mellitus, dyslipidemia, male gender and smoking/tobacco exposure. Past treatments include angiotensin blockers. There is no history of CVA or retinopathy.  Hyperlipidemia This is a chronic problem. The current episode started more than 1 year ago. Pertinent negatives include no  chest pain, myalgias or shortness of breath. Current antihyperlipidemic treatment includes statins. Risk factors for coronary artery disease include dyslipidemia, diabetes mellitus, hypertension and male sex.     Review of Systems  Constitutional: Negative for fatigue and unexpected weight change.  HENT: Negative for dental  problem, mouth sores and trouble swallowing.   Eyes: Negative for visual disturbance.  Respiratory: Negative for cough, choking, chest tightness, shortness of breath and wheezing.   Cardiovascular: Negative for chest pain, palpitations and leg swelling.  Gastrointestinal: Negative for nausea, vomiting, abdominal pain, diarrhea, constipation and abdominal distention.  Endocrine: Negative for polydipsia, polyphagia and polyuria.  Genitourinary: Negative for dysuria, urgency, hematuria and flank pain.  Musculoskeletal: Negative for myalgias, back pain, gait problem and neck pain.  Skin: Negative for pallor, rash and wound.  Neurological: Negative for seizures, syncope, weakness, numbness and headaches.  Psychiatric/Behavioral: Negative.  Negative for confusion and dysphoric mood.    Objective:    BP 135/79 mmHg  Pulse 70  Ht 5\' 10"  (1.778 m)  Wt 204 lb (92.534 kg)  BMI 29.27 kg/m2  SpO2 98%  Wt Readings from Last 3 Encounters:  07/31/15 204 lb (92.534 kg)  05/21/15 203 lb (92.08 kg)  05/20/15 201 lb (91.173 kg)    Physical Exam  Constitutional: He is oriented to person, place, and time. He appears well-developed and well-nourished. He is cooperative. No distress.  HENT:  Head: Normocephalic and atraumatic.  Eyes: EOM are normal.  Neck: Normal range of motion. Neck supple. No tracheal deviation present. No thyromegaly present.  Cardiovascular: Normal rate, S1 normal, S2 normal and normal heart sounds.  Exam reveals no gallop.   No murmur heard. Pulses:      Dorsalis pedis pulses are 1+ on the right side, and 1+ on the left side.       Posterior tibial pulses are 1+ on the right side, and 1+ on the left side.  Pulmonary/Chest: Breath sounds normal. No respiratory distress. He has no wheezes.  Abdominal: Soft. Bowel sounds are normal. He exhibits no distension. There is no tenderness. There is no guarding and no CVA tenderness.  Musculoskeletal: He exhibits no edema.       Right  shoulder: He exhibits no swelling and no deformity.  Neurological: He is alert and oriented to person, place, and time. He has normal strength and normal reflexes. No cranial nerve deficit or sensory deficit. Gait normal.  Skin: Skin is warm and dry. No rash noted. No cyanosis. Nails show no clubbing.  Psychiatric: He has a normal mood and affect. His speech is normal and behavior is normal. Judgment and thought content normal. Cognition and memory are normal.     Diabetic Labs (most recent): Lab Results  Component Value Date   HGBA1C 8 07/24/2015   HGBA1C 8.2* 04/02/2015    Completed labs from 07/24/2015 to be scanned into his records.  Assessment & Plan:   1. Diabetes mellitus with stage 3 chronic kidney disease ([place])  - Patient has currently uncontrolled symptomatic type 2 DM since  75 years of age,  with most recent A1c of 8 %.  -He came with near target blood glucose profile since last visit.  -His diabetes is complicated by stage 3 CK D and patient remains at a high risk for more acute and chronic complications of diabetes which include CAD, CVA, CKD, retinopathy, and neuropathy. These are all discussed in detail with the patient.  - I have counseled the patient on diet management and weight  loss, by adopting a carbohydrate restricted/protein rich diet.  - Suggestion is made for patient to avoid simple carbohydrates   from their diet including Cakes , Desserts, Ice Cream,  Soda (  diet and regular) , Sweet Tea , Candies,  Chips, Cookies, Artificial Sweeteners,   and "Sugar-free" Products . This will help patient to have stable blood glucose profile and potentially avoid unintended weight gain.  - I encouraged the patient to switch to  unprocessed or minimally processed complex starch and increased protein intake (animal or plant source), fruits, and vegetables.  - Patient is advised to stick to a routine mealtimes to eat 3 meals  a day and avoid unnecessary snacks ( to snack only  to correct hypoglycemia).  - The patient will be scheduled with Jearld Fenton, RDN, CDE for individualized DM education.  - I have approached patient with the following individualized plan to manage diabetes and patient agrees:   - I  will increase basal insulin Lantus to 44 units QHS,  associated with strict monitoring of glucose  AC breakfast . -I will add Januvia 25 mg by mouth every morning. -He will not require bolus insulin depending on his blood glucose readings. -Patient is encouraged to call clinic for blood glucose levels less than 70 or above 300 mg /dl. -Patient is not a candidate for metformin andSGLT2 inhibitors due to CKD.  - Patient will be considered for incretin therapy as appropriate next visit. - Patient specific target  A1c;  LDL, HDL, Triglycerides, and  Waist Circumference were discussed in detail.  2) BP/HTN: uncontrolled. Continue current medications including ACEI/ARB. 3) Lipids/HPL:  Controlled unknown, continue statins. 4)  Weight/Diet: CDE Consult is  initiated , exercise, and detailed carbohydrates information provided.  5) Chronic Care/Health Maintenance:  -Patient is on ACEI/ARB and Statin medications and encouraged to continue to follow up with Ophthalmology, Podiatrist at least yearly or according to recommendations, and advised to   stay away from smoking. I have recommended yearly flu vaccine and pneumonia vaccination at least every 5 years; moderate intensity exercise for up to 150 minutes weekly; and  sleep for at least 7 hours a day.  -25 minutes of time was spent on the care of this patient , 50% of which was applied for counseling on diabetes complications and their preventions.  - Patient to bring meter and  blood glucose logs during their next visit.   - I advised patient to maintain close follow up with Celedonio Savage, MD for primary care needs.  Follow up plan: - Return in about 3 months (around 10/28/2015) for diabetes, high blood pressure,  high cholesterol, follow up with pre-visit labs, meter, and logs.  Glade Lloyd, MD Phone: [phone]  Fax: [phone]   07/31/2015, 10:55 AM

## 2015-07-31 NOTE — Patient Instructions (Signed)

## 2015-08-05 ENCOUNTER — Other Ambulatory Visit: Payer: Self-pay

## 2015-08-05 MED ORDER — PEN NEEDLES 31G X 8 MM MISC: Status: DC

## 2015-08-05 MED ORDER — INSULIN GLARGINE 100 UNIT/ML SOLOSTAR PEN: 44.0000 [IU] | PEN_INJECTOR | Freq: Every day | SUBCUTANEOUS | Status: DC

## 2015-08-19 DIAGNOSIS — M25551 Pain in right hip: Secondary | ICD-10-CM | POA: Diagnosis not present

## 2015-08-19 DIAGNOSIS — G8929 Other chronic pain: Secondary | ICD-10-CM | POA: Diagnosis not present

## 2015-08-19 DIAGNOSIS — M25571 Pain in right ankle and joints of right foot: Secondary | ICD-10-CM | POA: Diagnosis not present

## 2015-08-19 DIAGNOSIS — M25552 Pain in left hip: Secondary | ICD-10-CM | POA: Diagnosis not present

## 2015-08-19 DIAGNOSIS — M160 Bilateral primary osteoarthritis of hip: Secondary | ICD-10-CM | POA: Diagnosis not present

## 2015-10-01 DIAGNOSIS — E1122 Type 2 diabetes mellitus with diabetic chronic kidney disease: Secondary | ICD-10-CM | POA: Diagnosis not present

## 2015-10-01 DIAGNOSIS — D649 Anemia, unspecified: Secondary | ICD-10-CM | POA: Diagnosis not present

## 2015-10-01 DIAGNOSIS — I129 Hypertensive chronic kidney disease with stage 1 through stage 4 chronic kidney disease, or unspecified chronic kidney disease: Secondary | ICD-10-CM | POA: Diagnosis not present

## 2015-10-01 DIAGNOSIS — Z7982 Long term (current) use of aspirin: Secondary | ICD-10-CM | POA: Diagnosis not present

## 2015-10-01 DIAGNOSIS — N183 Chronic kidney disease, stage 3 (moderate): Secondary | ICD-10-CM | POA: Diagnosis not present

## 2015-10-01 DIAGNOSIS — Z79899 Other long term (current) drug therapy: Secondary | ICD-10-CM | POA: Diagnosis not present

## 2015-10-01 DIAGNOSIS — Z794 Long term (current) use of insulin: Secondary | ICD-10-CM | POA: Diagnosis not present

## 2015-10-15 DIAGNOSIS — E119 Type 2 diabetes mellitus without complications: Secondary | ICD-10-CM | POA: Diagnosis not present

## 2015-10-15 DIAGNOSIS — H40013 Open angle with borderline findings, low risk, bilateral: Secondary | ICD-10-CM | POA: Diagnosis not present

## 2015-10-23 DIAGNOSIS — N183 Chronic kidney disease, stage 3 (moderate): Secondary | ICD-10-CM | POA: Diagnosis not present

## 2015-10-23 DIAGNOSIS — E1122 Type 2 diabetes mellitus with diabetic chronic kidney disease: Secondary | ICD-10-CM | POA: Diagnosis not present

## 2015-10-24 ENCOUNTER — Other Ambulatory Visit: Payer: Self-pay | Admitting: "Endocrinology

## 2015-10-30 ENCOUNTER — Ambulatory Visit (INDEPENDENT_AMBULATORY_CARE_PROVIDER_SITE_OTHER): Payer: Medicare Other | Admitting: "Endocrinology

## 2015-10-30 ENCOUNTER — Encounter: Payer: Self-pay | Admitting: "Endocrinology

## 2015-10-30 VITALS — BP 138/78 | HR 60 | Ht 70.0 in | Wt 205.0 lb

## 2015-10-30 DIAGNOSIS — I10 Essential (primary) hypertension: Secondary | ICD-10-CM

## 2015-10-30 DIAGNOSIS — E785 Hyperlipidemia, unspecified: Secondary | ICD-10-CM

## 2015-10-30 DIAGNOSIS — N183 Chronic kidney disease, stage 3 unspecified: Secondary | ICD-10-CM

## 2015-10-30 DIAGNOSIS — E1122 Type 2 diabetes mellitus with diabetic chronic kidney disease: Secondary | ICD-10-CM | POA: Diagnosis not present

## 2015-10-30 NOTE — Patient Instructions (Signed)

## 2015-10-30 NOTE — Progress Notes (Signed)
Subjective:    Patient ID: John Bishop, male    DOB: [birth year]. Patient is here to follow-up for management of diabetes requested by  Celedonio Savage, MD  Past Medical History  Diagnosis Date  . Diabetes mellitus, type II ([place])   . Hypertension   . Hyperlipidemia   . Kidney disease    Past Surgical History  Procedure Laterality Date  . Eye surgery     Social History   Social History  . Marital Status: Married    Spouse Name: N/A  . Number of Children: N/A  . Years of Education: N/A   Social History Main Topics  . Smoking status: Former Research scientist (life sciences)  . Smokeless tobacco: None  . Alcohol Use: No  . Drug Use: No  . Sexual Activity: Not Asked   Other Topics Concern  . None   Social History Narrative   Outpatient Encounter Prescriptions as of 10/30/2015  Medication Sig  . amLODipine (NORVASC) 5 MG tablet Take 5 mg by mouth daily.  Marland Kitchen aspirin 81 MG tablet Take 81 mg by mouth daily.  Marland Kitchen atorvastatin (LIPITOR) 10 MG tablet Take 10 mg by mouth daily.  . Cholecalciferol (VITAMIN D3) 2000 UNITS TABS Take by mouth.  . furosemide (LASIX) 20 MG tablet Take 20 mg by mouth daily.  . Insulin Glargine (LANTUS SOLOSTAR) 100 UNIT/ML Solostar Pen Inject 44 Units into the skin at bedtime.  . Insulin Pen Needle (PEN NEEDLES) 31G X 8 MM MISC Use qhs  . JANUVIA 25 MG tablet TAKE 1 TABLET BY MOUTH EVERY DAY  . losartan-hydrochlorothiazide (HYZAAR) 100-25 MG tablet Take 1 tablet by mouth daily.  . metoprolol tartrate (LOPRESSOR) 25 MG tablet Take 25 mg by mouth 2 (two) times daily.  . Omega-3 Fatty Acids (FISH OIL) 1200 MG CAPS Take by mouth.  . vitamin C (ASCORBIC ACID) 500 MG tablet Take 500 mg by mouth daily.   No facility-administered encounter medications on file as of 10/30/2015.   ALLERGIES: No Known Allergies VACCINATION STATUS:  There is no immunization history on file for this patient.  Diabetes He presents for his follow-up diabetic visit. He has type 2 diabetes mellitus. Onset  time: He was diagnosed at approximate age of 45 years. His disease course has been improving. There are no hypoglycemic associated symptoms. Pertinent negatives for hypoglycemia include no confusion, headaches, pallor or seizures. Pertinent negatives for diabetes include no chest pain, no fatigue, no polydipsia, no polyphagia, no polyuria and no weakness. There are no hypoglycemic complications. Symptoms are improving. Diabetic complications include nephropathy. Pertinent negatives for diabetic complications include no CVA, heart disease or retinopathy. Risk factors for coronary artery disease include diabetes mellitus, dyslipidemia, male sex and tobacco exposure. Current diabetic treatment includes insulin injections and oral agent (dual therapy). He is compliant with treatment most of the time. His weight is stable. He is following a generally healthy diet. He has not had a previous visit with a dietitian. He participates in exercise intermittently. His breakfast blood glucose range is generally 140-180 mg/dl. An ACE inhibitor/angiotensin II receptor blocker is being taken. Eye exam is current.  Hypertension This is a chronic problem. The current episode started more than 1 year ago. Pertinent negatives include no chest pain, headaches, neck pain, palpitations or shortness of breath. Risk factors for coronary artery disease include diabetes mellitus, dyslipidemia, male gender and smoking/tobacco exposure. Past treatments include angiotensin blockers. There is no history of CVA or retinopathy.  Hyperlipidemia This is a chronic problem. The current  episode started more than 1 year ago. Pertinent negatives include no chest pain, myalgias or shortness of breath. Current antihyperlipidemic treatment includes statins. Risk factors for coronary artery disease include dyslipidemia, diabetes mellitus, hypertension and male sex.     Review of Systems  Constitutional: Negative for fatigue and unexpected weight  change.  HENT: Negative for dental problem, mouth sores and trouble swallowing.   Eyes: Negative for visual disturbance.  Respiratory: Negative for cough, choking, chest tightness, shortness of breath and wheezing.   Cardiovascular: Negative for chest pain, palpitations and leg swelling.  Gastrointestinal: Negative for nausea, vomiting, abdominal pain, diarrhea, constipation and abdominal distention.  Endocrine: Negative for polydipsia, polyphagia and polyuria.  Genitourinary: Negative for dysuria, urgency, hematuria and flank pain.  Musculoskeletal: Negative for myalgias, back pain, gait problem and neck pain.  Skin: Negative for pallor, rash and wound.  Neurological: Negative for seizures, syncope, weakness, numbness and headaches.  Psychiatric/Behavioral: Negative.  Negative for confusion and dysphoric mood.    Objective:    BP 138/78 mmHg  Pulse 60  Ht 5\' 10"  (1.778 m)  Wt 205 lb (92.987 kg)  BMI 29.41 kg/m2  SpO2 98%  Wt Readings from Last 3 Encounters:  10/30/15 205 lb (92.987 kg)  07/31/15 204 lb (92.534 kg)  05/21/15 203 lb (92.08 kg)    Physical Exam  Constitutional: He is oriented to person, place, and time. He appears well-developed and well-nourished. He is cooperative. No distress.  HENT:  Head: Normocephalic and atraumatic.  Eyes: EOM are normal.  Neck: Normal range of motion. Neck supple. No tracheal deviation present. No thyromegaly present.  Cardiovascular: Normal rate, S1 normal, S2 normal and normal heart sounds.  Exam reveals no gallop.   No murmur heard. Pulses:      Dorsalis pedis pulses are 1+ on the right side, and 1+ on the left side.       Posterior tibial pulses are 1+ on the right side, and 1+ on the left side.  Pulmonary/Chest: Breath sounds normal. No respiratory distress. He has no wheezes.  Abdominal: Soft. Bowel sounds are normal. He exhibits no distension. There is no tenderness. There is no guarding and no CVA tenderness.  Musculoskeletal: He  exhibits no edema.       Right shoulder: He exhibits no swelling and no deformity.  Neurological: He is alert and oriented to person, place, and time. He has normal strength and normal reflexes. No cranial nerve deficit or sensory deficit. Gait normal.  Skin: Skin is warm and dry. No rash noted. No cyanosis. Nails show no clubbing.  Psychiatric: He has a normal mood and affect. His speech is normal and behavior is normal. Judgment and thought content normal. Cognition and memory are normal.     Diabetic Labs (most recent): Lab Results  Component Value Date   HGBA1C 8 07/24/2015   HGBA1C 8.2* 04/02/2015    Completed labs from 07/24/2015 to be scanned into his records.  Assessment & Plan:   1. Diabetes mellitus with stage 3 chronic kidney disease ([place])  - Patient has currently uncontrolled symptomatic type 2 DM since  75 years of age,  with most recent A1c of 7.5% improving from 8.2%.  -He came with near target blood glucose profile since last visit.  -His diabetes is complicated by stage 3 CK D and patient remains at a high risk for more acute and chronic complications of diabetes which include CAD, CVA, CKD, retinopathy, and neuropathy. These are all discussed in detail with the  patient.  - I have counseled the patient on diet management and weight loss, by adopting a carbohydrate restricted/protein rich diet.  - Suggestion is made for patient to avoid simple carbohydrates   from their diet including Cakes , Desserts, Ice Cream,  Soda (  diet and regular) , Sweet Tea , Candies,  Chips, Cookies, Artificial Sweeteners,   and "Sugar-free" Products . This will help patient to have stable blood glucose profile and potentially avoid unintended weight gain.  - I encouraged the patient to switch to  unprocessed or minimally processed complex starch and increased protein intake (animal or plant source), fruits, and vegetables.  - Patient is advised to stick to a routine mealtimes to eat 3 meals   a day and avoid unnecessary snacks ( to snack only to correct hypoglycemia).  - The patient will be scheduled with Jearld Fenton, RDN, CDE for individualized DM education.  - I have approached patient with the following individualized plan to manage diabetes and patient agrees:   - I  will continue basal insulin Lantus 44 units QHS,  associated with strict monitoring of glucose  AC breakfast . -I will continue Januvia 25 mg by mouth every morning. -He will not require bolus insulin depending on his blood glucose readings. -Patient is encouraged to call clinic for blood glucose levels less than 70 or above 300 mg /dl. -Patient is not a candidate for metformin andSGLT2 inhibitors due to CKD.  - Patient will be considered for incretin therapy as appropriate next visit. - Patient specific target  A1c;  LDL, HDL, Triglycerides, and  Waist Circumference were discussed in detail.  2) BP/HTN: uncontrolled. Continue current medications including ACEI/ARB. 3) Lipids/HPL:  Controlled unknown, continue statins. 4)  Weight/Diet: CDE Consult is  initiated , exercise, and detailed carbohydrates information provided.  5) Chronic Care/Health Maintenance:  -Patient is on ACEI/ARB and Statin medications and encouraged to continue to follow up with Ophthalmology, Podiatrist at least yearly or according to recommendations, and advised to   stay away from smoking. I have recommended yearly flu vaccine and pneumonia vaccination at least every 5 years; moderate intensity exercise for up to 150 minutes weekly; and  sleep for at least 7 hours a day.  -25 minutes of time was spent on the care of this patient , 50% of which was applied for counseling on diabetes complications and their preventions.  - Patient to bring meter and  blood glucose logs during their next visit.   - I advised patient to maintain close follow up with Celedonio Savage, MD for primary care needs.  Follow up plan: - Return in about 3 months  (around 01/30/2016) for diabetes, high blood pressure, high cholesterol, follow up with pre-visit labs, meter, and logs.  Glade Lloyd, MD Phone: [phone]  Fax: [phone]   10/30/2015, 11:03 AM

## 2015-11-11 ENCOUNTER — Other Ambulatory Visit: Payer: Self-pay | Admitting: "Endocrinology

## 2015-12-03 DIAGNOSIS — I10 Essential (primary) hypertension: Secondary | ICD-10-CM | POA: Diagnosis not present

## 2015-12-03 DIAGNOSIS — R600 Localized edema: Secondary | ICD-10-CM | POA: Diagnosis not present

## 2015-12-25 ENCOUNTER — Encounter: Payer: Self-pay | Admitting: Cardiology

## 2015-12-25 DIAGNOSIS — Z0000 Encounter for general adult medical examination without abnormal findings: Secondary | ICD-10-CM | POA: Diagnosis not present

## 2015-12-25 DIAGNOSIS — R079 Chest pain, unspecified: Secondary | ICD-10-CM | POA: Diagnosis not present

## 2015-12-25 DIAGNOSIS — E1165 Type 2 diabetes mellitus with hyperglycemia: Secondary | ICD-10-CM | POA: Diagnosis not present

## 2015-12-25 DIAGNOSIS — I10 Essential (primary) hypertension: Secondary | ICD-10-CM | POA: Diagnosis not present

## 2015-12-25 DIAGNOSIS — E7801 Familial hypercholesterolemia: Secondary | ICD-10-CM | POA: Diagnosis not present

## 2015-12-25 DIAGNOSIS — E559 Vitamin D deficiency, unspecified: Secondary | ICD-10-CM | POA: Diagnosis not present

## 2015-12-25 DIAGNOSIS — Z6829 Body mass index (BMI) 29.0-29.9, adult: Secondary | ICD-10-CM | POA: Diagnosis not present

## 2015-12-25 DIAGNOSIS — Z1211 Encounter for screening for malignant neoplasm of colon: Secondary | ICD-10-CM | POA: Diagnosis not present

## 2015-12-31 ENCOUNTER — Encounter: Payer: Self-pay | Admitting: Cardiology

## 2015-12-31 DIAGNOSIS — R079 Chest pain, unspecified: Secondary | ICD-10-CM | POA: Diagnosis not present

## 2016-01-21 DIAGNOSIS — Z86010 Personal history of colonic polyps: Secondary | ICD-10-CM | POA: Diagnosis not present

## 2016-01-21 DIAGNOSIS — Z801 Family history of malignant neoplasm of trachea, bronchus and lung: Secondary | ICD-10-CM | POA: Diagnosis not present

## 2016-01-21 DIAGNOSIS — Z8249 Family history of ischemic heart disease and other diseases of the circulatory system: Secondary | ICD-10-CM | POA: Diagnosis not present

## 2016-01-21 DIAGNOSIS — E785 Hyperlipidemia, unspecified: Secondary | ICD-10-CM | POA: Diagnosis not present

## 2016-01-21 DIAGNOSIS — Z794 Long term (current) use of insulin: Secondary | ICD-10-CM | POA: Diagnosis not present

## 2016-01-21 DIAGNOSIS — K219 Gastro-esophageal reflux disease without esophagitis: Secondary | ICD-10-CM | POA: Diagnosis not present

## 2016-01-21 DIAGNOSIS — I129 Hypertensive chronic kidney disease with stage 1 through stage 4 chronic kidney disease, or unspecified chronic kidney disease: Secondary | ICD-10-CM | POA: Diagnosis not present

## 2016-01-21 DIAGNOSIS — E1122 Type 2 diabetes mellitus with diabetic chronic kidney disease: Secondary | ICD-10-CM | POA: Diagnosis not present

## 2016-01-21 DIAGNOSIS — Z7982 Long term (current) use of aspirin: Secondary | ICD-10-CM | POA: Diagnosis not present

## 2016-01-21 DIAGNOSIS — Z1211 Encounter for screening for malignant neoplasm of colon: Secondary | ICD-10-CM | POA: Diagnosis not present

## 2016-01-21 DIAGNOSIS — N183 Chronic kidney disease, stage 3 (moderate): Secondary | ICD-10-CM | POA: Diagnosis not present

## 2016-01-21 DIAGNOSIS — Z79899 Other long term (current) drug therapy: Secondary | ICD-10-CM | POA: Diagnosis not present

## 2016-01-22 ENCOUNTER — Other Ambulatory Visit: Payer: Self-pay | Admitting: "Endocrinology

## 2016-01-27 ENCOUNTER — Encounter: Payer: Self-pay | Admitting: *Deleted

## 2016-01-28 ENCOUNTER — Ambulatory Visit (INDEPENDENT_AMBULATORY_CARE_PROVIDER_SITE_OTHER): Payer: Medicare Other | Admitting: Cardiology

## 2016-01-28 ENCOUNTER — Encounter: Payer: Self-pay | Admitting: Cardiology

## 2016-01-28 VITALS — BP 160/82 | HR 56 | Ht 70.0 in | Wt 210.6 lb

## 2016-01-28 DIAGNOSIS — R079 Chest pain, unspecified: Secondary | ICD-10-CM | POA: Diagnosis not present

## 2016-01-28 DIAGNOSIS — I10 Essential (primary) hypertension: Secondary | ICD-10-CM

## 2016-01-28 DIAGNOSIS — E785 Hyperlipidemia, unspecified: Secondary | ICD-10-CM

## 2016-01-28 DIAGNOSIS — N189 Chronic kidney disease, unspecified: Secondary | ICD-10-CM | POA: Diagnosis not present

## 2016-01-28 MED ORDER — ATORVASTATIN CALCIUM 40 MG PO TABS: 40.0000 mg | ORAL_TABLET | Freq: Every day | ORAL | 3 refills | Status: DC

## 2016-01-28 MED ORDER — ISOSORBIDE MONONITRATE ER 30 MG PO TB24: 15.0000 mg | ORAL_TABLET | Freq: Every day | ORAL | 3 refills | Status: DC

## 2016-01-28 NOTE — Patient Instructions (Signed)
Your physician recommends that you schedule a follow-up appointment in: 3 MONTHS WITH DR. Merrill  Your physician has recommended you make the following change in your medication:   INCREASE ATORVASTATIN 40 MG DAILY  START IMDUR 15 MG DAILY  Your physician has requested that you have an echocardiogram. Echocardiography is a painless test that uses sound waves to create images of your heart. It provides your doctor with information about the size and shape of your heart and how well your heart's chambers and valves are working. This procedure takes approximately one hour. There are no restrictions for this procedure.   Thank you for choosing [facility]!!

## 2016-01-28 NOTE — Progress Notes (Signed)
Clinical Summary John Bishop is a 75 y.o.male seen today for follow up of the following medical problems.  1. Chest pain - several year history of chest pain - occurs with exertion only. Tightness mid to left chest, 7/10. No other associated symptoms. Comes on 4 laps around track, can keep going 6-9 laps. Then pain resolves. Overall stable in severity and frequency.  - 12/2015 lexiscan showed small area of mild ischemia inferior wall, LVEF 50%. Low risk study.  CAD risk factors: DM2, hyperlipidemia, HTN, former tobacco, father MI 27s   2. CKD III - followed by pcp.   3. Hyperlipidemia - 12/2015 TG 121 TC 159 HDL 35 LDL 100 - compliant with statin  4. HTN - home bps average 135/66 - no ACE-I due to poor renal funciton - was on norvasc, stopped due to LE edema. Stopped June, had been on norvasc 5mg  daily.  Past Medical History:  Diagnosis Date  . Diabetes mellitus, type II ([place])   . Hyperlipidemia   . Hypertension   . Kidney disease      No Known Allergies   Current Outpatient Prescriptions  Medication Sig Dispense Refill  . amLODipine (NORVASC) 5 MG tablet Take 5 mg by mouth daily.    Marland Kitchen aspirin 81 MG tablet Take 81 mg by mouth daily.    Marland Kitchen atorvastatin (LIPITOR) 10 MG tablet Take 10 mg by mouth daily.    . Cholecalciferol (VITAMIN D3) 2000 UNITS TABS Take by mouth.    . furosemide (LASIX) 20 MG tablet Take 20 mg by mouth daily.    . Insulin Pen Needle (PEN NEEDLES) 31G X 8 MM MISC Use qhs 100 each 5  . JANUVIA 25 MG tablet TAKE 1 TABLET BY MOUTH EVERY DAY 30 tablet 2  . LANTUS SOLOSTAR 100 UNIT/ML Solostar Pen INJECT 44 UNITS INTO THE SKIN AT BEDTIME. 15 mL 2  . losartan-hydrochlorothiazide (HYZAAR) 100-25 MG tablet Take 1 tablet by mouth daily.    . metoprolol tartrate (LOPRESSOR) 25 MG tablet Take 25 mg by mouth 2 (two) times daily.    . Omega-3 Fatty Acids (FISH OIL) 1200 MG CAPS Take by mouth.    . vitamin C (ASCORBIC ACID) 500 MG tablet Take 500 mg by mouth daily.      No current facility-administered medications for this visit.      Past Surgical History:  Procedure Laterality Date  . EYE SURGERY       No Known Allergies    Family History  Problem Relation Age of Onset  . Diabetes Other   . Heart disease Other   . Hyperlipidemia Other   . Hypertension Other      Social History John Bishop reports that he has quit smoking. He does not have any smokeless tobacco history on file. John Bishop reports that he does not drink alcohol.   Review of Systems CONSTITUTIONAL: No weight loss, fever, chills, weakness or fatigue.  HEENT: Eyes: No visual loss, blurred vision, double vision or yellow sclerae.No hearing loss, sneezing, congestion, runny nose or sore throat.  SKIN: No rash or itching.  CARDIOVASCULAR: per HPI RESPIRATORY: No shortness of breath, cough or sputum.  GASTROINTESTINAL: No anorexia, nausea, vomiting or diarrhea. No abdominal pain or blood.  GENITOURINARY: No burning on urination, no polyuria NEUROLOGICAL: No headache, dizziness, syncope, paralysis, ataxia, numbness or tingling in the extremities. No change in bowel or bladder control.  MUSCULOSKELETAL: No muscle, back pain, joint pain or stiffness.  LYMPHATICS: No enlarged  nodes. No history of splenectomy.  PSYCHIATRIC: No history of depression or anxiety.  ENDOCRINOLOGIC: No reports of sweating, cold or heat intolerance. No polyuria or polydipsia.  Marland Kitchen   Physical Examination Vitals:   01/28/16 0833  BP: (!) 160/82  Pulse: (!) 56   Vitals:   01/28/16 0833  Weight: 210 lb 9.6 oz (95.5 kg)  Height: 5\' 10"  (1.778 m)    Gen: resting comfortably, no acute distress HEENT: no scleral icterus, pupils equal round and reactive, no palptable cervical adenopathy,  CV: RRR, no m/r/g, no jvd Resp: Clear to auscultation bilaterally GI: abdomen is soft, non-tender, non-distended, normal bowel sounds, no hepatosplenomegaly MSK: extremities are warm, no edema.  Skin: warm, no  rash Neuro:  no focal deficits Psych: appropriate affect      Assessment and Plan   1. Chest pain - symptoms consistent with stable angina. Abnormal nuclear stress test overall low risk - continue medical therapy. Will start imdur 15mg  daily - EKG in clinic shows SR, no acute ischemic changes - repeat echo  2. HTN - home numbers at goal, continue current meds  3. Hyperlipidemia - based on DM2 history should be on at least moderate statin, will increase atorva to 40mg  daily    F/u 3 months     Arnoldo Lenis, M.D.

## 2016-02-12 ENCOUNTER — Other Ambulatory Visit: Payer: Self-pay

## 2016-02-12 ENCOUNTER — Ambulatory Visit (INDEPENDENT_AMBULATORY_CARE_PROVIDER_SITE_OTHER): Payer: Medicare Other

## 2016-02-12 DIAGNOSIS — R079 Chest pain, unspecified: Secondary | ICD-10-CM

## 2016-02-13 DIAGNOSIS — E1122 Type 2 diabetes mellitus with diabetic chronic kidney disease: Secondary | ICD-10-CM | POA: Diagnosis not present

## 2016-02-13 DIAGNOSIS — E785 Hyperlipidemia, unspecified: Secondary | ICD-10-CM | POA: Diagnosis not present

## 2016-02-13 DIAGNOSIS — N183 Chronic kidney disease, stage 3 (moderate): Secondary | ICD-10-CM | POA: Diagnosis not present

## 2016-02-14 ENCOUNTER — Telehealth: Payer: Self-pay

## 2016-02-14 NOTE — Telephone Encounter (Signed)
Called pt, no answer- left message for pt to return call.

## 2016-02-14 NOTE — Telephone Encounter (Signed)
-----   Message from Arnoldo Lenis, MD sent at 02/14/2016  3:40 PM EDT ----- Echo looks good, normal heart function  Zandra Abts MD

## 2016-02-19 ENCOUNTER — Ambulatory Visit (INDEPENDENT_AMBULATORY_CARE_PROVIDER_SITE_OTHER): Payer: Medicare Other | Admitting: "Endocrinology

## 2016-02-19 ENCOUNTER — Encounter: Payer: Self-pay | Admitting: "Endocrinology

## 2016-02-19 VITALS — BP 138/70 | HR 70 | Resp 18 | Ht 70.0 in | Wt 209.0 lb

## 2016-02-19 DIAGNOSIS — I10 Essential (primary) hypertension: Secondary | ICD-10-CM

## 2016-02-19 DIAGNOSIS — E785 Hyperlipidemia, unspecified: Secondary | ICD-10-CM

## 2016-02-19 DIAGNOSIS — N183 Chronic kidney disease, stage 3 unspecified: Secondary | ICD-10-CM

## 2016-02-19 DIAGNOSIS — E1122 Type 2 diabetes mellitus with diabetic chronic kidney disease: Secondary | ICD-10-CM | POA: Diagnosis not present

## 2016-02-19 MED ORDER — INSULIN DEGLUDEC 100 UNIT/ML ~~LOC~~ SOPN: 50.0000 [IU] | PEN_INJECTOR | Freq: Every day | SUBCUTANEOUS | 2 refills | Status: DC

## 2016-02-19 NOTE — Patient Instructions (Signed)

## 2016-02-19 NOTE — Progress Notes (Signed)
Subjective:    Patient ID: John Bishop, male    DOB: Sep 06, 1940. Patient is here to follow-up for management of diabetes requested by  Celedonio Savage, MD  Past Medical History:  Diagnosis Date  . Diabetes mellitus, type II ([place])   . Hyperlipidemia   . Hypertension   . Kidney disease    Past Surgical History:  Procedure Laterality Date  . EYE SURGERY     Social History   Social History  . Marital status: Married    Spouse name: N/A  . Number of children: N/A  . Years of education: N/A   Social History Main Topics  . Smoking status: Former Smoker    Quit date: 06/07/1986  . Smokeless tobacco: Never Used  . Alcohol use No  . Drug use: No  . Sexual activity: Not Asked   Other Topics Concern  . None   Social History Narrative  . None   Outpatient Encounter Prescriptions as of 02/19/2016  Medication Sig  . aspirin 81 MG tablet Take 81 mg by mouth daily.  Marland Kitchen atorvastatin (LIPITOR) 40 MG tablet Take 1 tablet (40 mg total) by mouth daily.  . Cholecalciferol (VITAMIN D3) 2000 UNITS TABS Take by mouth 2 (two) times daily.   . Insulin Pen Needle (PEN NEEDLES) 31G X 8 MM MISC Use qhs  . isosorbide mononitrate (IMDUR) 30 MG 24 hr tablet Take 0.5 tablets (15 mg total) by mouth daily.  Marland Kitchen JANUVIA 25 MG tablet TAKE 1 TABLET BY MOUTH EVERY DAY  . losartan-hydrochlorothiazide (HYZAAR) 100-25 MG tablet Take 1 tablet by mouth daily.  . metoprolol succinate (TOPROL-XL) 50 MG 24 hr tablet Take 25 mg by mouth 2 (two) times daily. Take with or immediately following a meal.  . Omega-3 Fatty Acids (FISH OIL) 1200 MG CAPS Take by mouth 2 (two) times daily.   . vitamin C (ASCORBIC ACID) 500 MG tablet Take 500 mg by mouth daily.  . [DISCONTINUED] LANTUS SOLOSTAR 100 UNIT/ML Solostar Pen INJECT 44 UNITS INTO THE SKIN AT BEDTIME.  . Insulin Degludec (TRESIBA FLEXTOUCH) 100 UNIT/ML SOPN Inject 50 Units into the skin at bedtime.   No facility-administered encounter medications on file as of  02/19/2016.    ALLERGIES: No Known Allergies VACCINATION STATUS:  There is no immunization history on file for this patient.  Diabetes  He presents for his follow-up diabetic visit. He has type 2 diabetes mellitus. Onset time: He was diagnosed at approximate age of 83 years. His disease course has been improving. There are no hypoglycemic associated symptoms. Pertinent negatives for hypoglycemia include no confusion, headaches, pallor or seizures. Pertinent negatives for diabetes include no chest pain, no fatigue, no polydipsia, no polyphagia, no polyuria and no weakness. There are no hypoglycemic complications. Symptoms are improving. Diabetic complications include nephropathy. Pertinent negatives for diabetic complications include no CVA, heart disease or retinopathy. Risk factors for coronary artery disease include diabetes mellitus, dyslipidemia, male sex and tobacco exposure. Current diabetic treatment includes insulin injections and oral agent (dual therapy). He is compliant with treatment most of the time. His weight is increasing steadily. He is following a generally healthy diet. He has not had a previous visit with a dietitian. He participates in exercise intermittently. His breakfast blood glucose range is generally 140-180 mg/dl. An ACE inhibitor/angiotensin II receptor blocker is being taken. Eye exam is current.  Hypertension  This is a chronic problem. The current episode started more than 1 year ago. Pertinent negatives include no chest  pain, headaches, neck pain, palpitations or shortness of breath. Risk factors for coronary artery disease include diabetes mellitus, dyslipidemia, male gender and smoking/tobacco exposure. Past treatments include angiotensin blockers. There is no history of CVA or retinopathy.  Hyperlipidemia  This is a chronic problem. The current episode started more than 1 year ago. Pertinent negatives include no chest pain, myalgias or shortness of breath. Current  antihyperlipidemic treatment includes statins. Risk factors for coronary artery disease include dyslipidemia, diabetes mellitus, hypertension and male sex.     Review of Systems  Constitutional: Negative for fatigue and unexpected weight change.  HENT: Negative for dental problem, mouth sores and trouble swallowing.   Eyes: Negative for visual disturbance.  Respiratory: Negative for cough, choking, chest tightness, shortness of breath and wheezing.   Cardiovascular: Negative for chest pain, palpitations and leg swelling.  Gastrointestinal: Negative for abdominal distention, abdominal pain, constipation, diarrhea, nausea and vomiting.  Endocrine: Negative for polydipsia, polyphagia and polyuria.  Genitourinary: Negative for dysuria, flank pain, hematuria and urgency.  Musculoskeletal: Negative for back pain, gait problem, myalgias and neck pain.  Skin: Negative for pallor, rash and wound.  Neurological: Negative for seizures, syncope, weakness, numbness and headaches.  Psychiatric/Behavioral: Negative.  Negative for confusion and dysphoric mood.    Objective:    BP 138/70   Pulse 70   Resp 18   Ht 5\' 10"  (1.778 m)   Wt 209 lb (94.8 kg)   SpO2 97%   BMI 29.99 kg/m   Wt Readings from Last 3 Encounters:  02/19/16 209 lb (94.8 kg)  01/28/16 210 lb 9.6 oz (95.5 kg)  10/30/15 205 lb (93 kg)    Physical Exam  Constitutional: He is oriented to person, place, and time. He appears well-developed and well-nourished. He is cooperative. No distress.  HENT:  Head: Normocephalic and atraumatic.  Eyes: EOM are normal.  Neck: Normal range of motion. Neck supple. No tracheal deviation present. No thyromegaly present.  Cardiovascular: Normal rate, S1 normal, S2 normal and normal heart sounds.  Exam reveals no gallop.   No murmur heard. Pulses:      Dorsalis pedis pulses are 1+ on the right side, and 1+ on the left side.       Posterior tibial pulses are 1+ on the right side, and 1+ on the  left side.  Pulmonary/Chest: Breath sounds normal. No respiratory distress. He has no wheezes.  Abdominal: Soft. Bowel sounds are normal. He exhibits no distension. There is no tenderness. There is no guarding and no CVA tenderness.  Musculoskeletal: He exhibits no edema.       Right shoulder: He exhibits no swelling and no deformity.  Neurological: He is alert and oriented to person, place, and time. He has normal strength and normal reflexes. No cranial nerve deficit or sensory deficit. Gait normal.  Skin: Skin is warm and dry. No rash noted. No cyanosis. Nails show no clubbing.  Psychiatric: He has a normal mood and affect. His speech is normal and behavior is normal. Judgment and thought content normal. Cognition and memory are normal.     Diabetic Labs (most recent): Lab Results  Component Value Date   HGBA1C 8 07/24/2015   HGBA1C 8.2 (A) 04/02/2015    Completed labs from 07/24/2015 to be scanned into his records.  Assessment & Plan:   1. Diabetes mellitus with stage 3 chronic kidney disease ([place])  - Patient has currently uncontrolled symptomatic type 2 DM since  75 years of age,  with most recent  A1c of 7.4% improving from 8.2%.  -He came with near target blood glucose profile since last visit.  -His diabetes is complicated by stage 3 CK D and patient remains at a high risk for more acute and chronic complications of diabetes which include CAD, CVA, CKD, retinopathy, and neuropathy. These are all discussed in detail with the patient.  - I have counseled the patient on diet management and weight loss, by adopting a carbohydrate restricted/protein rich diet.  - Suggestion is made for patient to avoid simple carbohydrates   from their diet including Cakes , Desserts, Ice Cream,  Soda (  diet and regular) , Sweet Tea , Candies,  Chips, Cookies, Artificial Sweeteners,   and "Sugar-free" Products . This will help patient to have stable blood glucose profile and potentially avoid  unintended weight gain.  - I encouraged the patient to switch to  unprocessed or minimally processed complex starch and increased protein intake (animal or plant source), fruits, and vegetables.  - Patient is advised to stick to a routine mealtimes to eat 3 meals  a day and avoid unnecessary snacks ( to snack only to correct hypoglycemia).  - The patient will be scheduled with Jearld Fenton, RDN, CDE for individualized DM education.  - I have approached patient with the following individualized plan to manage diabetes and patient agrees:   - Per the recommendation of his insurance company, I  will prescribe Tresiba  50 units (to replace  Lantus ) units QHS,  associated with strict monitoring of glucose  AC breakfast . -I will discontinue Januvia due to cost. -He will not require bolus insulin depending on his blood glucose readings. -Patient is encouraged to call clinic for blood glucose levels less than 70 or above 200 mg /dl. -Patient is not a candidate for metformin andSGLT2 inhibitors due to CKD.  - Patient will be considered for incretin therapy as appropriate next visit. - Patient specific target  A1c;  LDL, HDL, Triglycerides, and  Waist Circumference were discussed in detail.  2) BP/HTN: uncontrolled. Continue current medications including ACEI/ARB. 3) Lipids/HPL:  Controlled unknown, continue statins. 4)  Weight/Diet: CDE Consult is  initiated , exercise, and detailed carbohydrates information provided.  5) Chronic Care/Health Maintenance:  -Patient is on ACEI/ARB and Statin medications and encouraged to continue to follow up with Ophthalmology, Podiatrist at least yearly or according to recommendations, and advised to   stay away from smoking. I have recommended yearly flu vaccine and pneumonia vaccination at least every 5 years; moderate intensity exercise for up to 150 minutes weekly; and  sleep for at least 7 hours a day.  -25 minutes of time was spent on the care of this  patient , 50% of which was applied for counseling on diabetes complications and their preventions.  - Patient to bring meter and  blood glucose logs during their next visit.   - I advised patient to maintain close follow up with Celedonio Savage, MD for primary care needs.  Follow up plan: - Return in about 4 months (around 06/20/2016) for follow up with pre-visit labs, meter, and logs.  Glade Lloyd, MD Phone: [phone]  Fax: [phone]   02/19/2016, 10:40 AM

## 2016-02-24 ENCOUNTER — Other Ambulatory Visit: Payer: Self-pay | Admitting: "Endocrinology

## 2016-02-24 MED ORDER — INSULIN DEGLUDEC 100 UNIT/ML ~~LOC~~ SOPN: 50.0000 [IU] | PEN_INJECTOR | Freq: Every day | SUBCUTANEOUS | 2 refills | Status: DC

## 2016-02-27 ENCOUNTER — Other Ambulatory Visit: Payer: Self-pay | Admitting: "Endocrinology

## 2016-02-29 ENCOUNTER — Other Ambulatory Visit: Payer: Self-pay | Admitting: "Endocrinology

## 2016-03-09 DIAGNOSIS — Z23 Encounter for immunization: Secondary | ICD-10-CM | POA: Diagnosis not present

## 2016-03-10 ENCOUNTER — Encounter: Payer: Self-pay | Admitting: "Endocrinology

## 2016-04-29 ENCOUNTER — Encounter: Payer: Self-pay | Admitting: Cardiology

## 2016-04-29 ENCOUNTER — Ambulatory Visit (INDEPENDENT_AMBULATORY_CARE_PROVIDER_SITE_OTHER): Payer: Medicare Other | Admitting: Cardiology

## 2016-04-29 VITALS — BP 119/67 | HR 54 | Ht 70.0 in | Wt 209.8 lb

## 2016-04-29 DIAGNOSIS — Z136 Encounter for screening for cardiovascular disorders: Secondary | ICD-10-CM

## 2016-04-29 DIAGNOSIS — E782 Mixed hyperlipidemia: Secondary | ICD-10-CM | POA: Diagnosis not present

## 2016-04-29 DIAGNOSIS — R0789 Other chest pain: Secondary | ICD-10-CM | POA: Diagnosis not present

## 2016-04-29 DIAGNOSIS — I10 Essential (primary) hypertension: Secondary | ICD-10-CM

## 2016-04-29 MED ORDER — ISOSORBIDE MONONITRATE ER 30 MG PO TB24: 30.0000 mg | ORAL_TABLET | Freq: Every day | ORAL | 3 refills | Status: DC

## 2016-04-29 NOTE — Progress Notes (Signed)
Clinical Summary John Bishop is a 75 y.o.male seen today for follow up of the following medical problems.  1. Chest pain - several year history of chest pain - occurs with exertion only. Tightness mid to left chest, 7/10. No other associated symptoms. Comes on 4 laps around track, can keep going 6-9 laps. Then pain resolves. Overall stable in severity and frequency.  - 12/2015 lexiscan showed small area of mild ischemia inferior wall, LVEF 50%. Low risk study.  - 02/2016 echo LVEF 55-60%, no WMAs, grade I diastolic dysfunction CAD risk factors: DM2, hyperlipidemia, HTN, former tobacco, father MI 89s  - last visit started imdur 15mg  daily.  - symptoms mildly improved since last visit. Still walking regularly without significant limitations.    2. CKD III - followed by pcp.   3. Hyperlipidemia - 02/2016 HDL 40 LDL 78 TC 134 TG 81 - compliant with statin  4. HTN - no ACE-I due to poor renal funciton - was on norvasc, stopped due to LE edema. Stopped June, had been on norvasc 5mg  daily.   5. DM2  - followed by endocrinology  6. AAA screen - history of tobacco abuse    SH: retired Manufacturing systems engineer Past Medical History:  Diagnosis Date  . Diabetes mellitus, type II ([place])   . Hyperlipidemia   . Hypertension   . Kidney disease      No Known Allergies   Current Outpatient Prescriptions  Medication Sig Dispense Refill  . aspirin 81 MG tablet Take 81 mg by mouth daily.    Marland Kitchen atorvastatin (LIPITOR) 40 MG tablet Take 1 tablet (40 mg total) by mouth daily. 90 tablet 3  . Cholecalciferol (VITAMIN D3) 2000 UNITS TABS Take by mouth 2 (two) times daily.     . Insulin Glargine (LANTUS SOLOSTAR) 100 UNIT/ML Solostar Pen Inject 50 Units into the skin at bedtime. 15 mL 2  . Insulin Pen Needle (PEN NEEDLES) 31G X 8 MM MISC Use qhs 100 each 5  . isosorbide mononitrate (IMDUR) 30 MG 24 hr tablet Take 0.5 tablets (15 mg total) by mouth daily. 45 tablet 3  . JANUVIA 25 MG tablet  TAKE 1 TABLET BY MOUTH EVERY DAY 30 tablet 2  . losartan-hydrochlorothiazide (HYZAAR) 100-25 MG tablet Take 1 tablet by mouth daily.    . metoprolol succinate (TOPROL-XL) 50 MG 24 hr tablet Take 25 mg by mouth 2 (two) times daily. Take with or immediately following a meal.    . Omega-3 Fatty Acids (FISH OIL) 1200 MG CAPS Take by mouth 2 (two) times daily.     . vitamin C (ASCORBIC ACID) 500 MG tablet Take 500 mg by mouth daily.     No current facility-administered medications for this visit.      Past Surgical History:  Procedure Laterality Date  . EYE SURGERY       No Known Allergies    Family History  Problem Relation Age of Onset  . Diabetes Other   . Heart disease Other   . Hyperlipidemia Other   . Hypertension Other      Social History Mr. Kuhl reports that he quit smoking about 29 years ago. He has never used smokeless tobacco. Mr. Hassebrock reports that he does not drink alcohol.   Review of Systems CONSTITUTIONAL: No weight loss, fever, chills, weakness or fatigue.  HEENT: Eyes: No visual loss, blurred vision, double vision or yellow sclerae.No hearing loss, sneezing, congestion, runny nose or sore throat.  SKIN: No  rash or itching.  CARDIOVASCULAR: per hpi RESPIRATORY: No shortness of breath, cough or sputum.  GASTROINTESTINAL: No anorexia, nausea, vomiting or diarrhea. No abdominal pain or blood.  GENITOURINARY: No burning on urination, no polyuria NEUROLOGICAL: No headache, dizziness, syncope, paralysis, ataxia, numbness or tingling in the extremities. No change in bowel or bladder control.  MUSCULOSKELETAL: No muscle, back pain, joint pain or stiffness.  LYMPHATICS: No enlarged nodes. No history of splenectomy.  PSYCHIATRIC: No history of depression or anxiety.  ENDOCRINOLOGIC: No reports of sweating, cold or heat intolerance. No polyuria or polydipsia.  Marland Kitchen   Physical Examination Vitals:   04/29/16 0924  BP: 119/67  Pulse: (!) 54   Vitals:   04/29/16  0924  Weight: 209 lb 12.8 oz (95.2 kg)  Height: 5\' 10"  (1.778 m)    Gen: resting comfortably, no acute distress HEENT: no scleral icterus, pupils equal round and reactive, no palptable cervical adenopathy,  CV; RRR, no m/r/g, no jvd Resp: Clear to auscultation bilaterally GI: abdomen is soft, non-tender, non-distended, normal bowel sounds, no hepatosplenomegaly MSK: extremities are warm, no edema.  Skin: warm, no rash Neuro:  no focal deficits Psych: appropriate affect   Diagnostic Studies  02/2016 echo Study Conclusions  - Left ventricle: The cavity size was normal. Wall thickness was   normal. Systolic function was normal. The estimated ejection   fraction was in the range of 55% to 60%. Wall motion was normal;   there were no regional wall motion abnormalities. Doppler   parameters are consistent with abnormal left ventricular   relaxation (grade 1 diastolic dysfunction). - Aortic valve: Mildly calcified annulus. Trileaflet; normal   thickness leaflets. Valve area (VTI): 2.41 cm^2. Valve area   (Vmax): 2.35 cm^2. Valve area (Vmean): 2.05 cm^2. - Mitral valve: Mildly calcified annulus. Normal thickness leaflets   . - Technically adequate study.  12/2015 MPI -small focus of ischemia inferior wall, LVEF 50%, low risk.   Assessment and Plan  1. Chest pain - symptoms consistent with stable angina. Abnormal nuclear stress test overall low risk - symptoms improved but not resolved on imdur 15mg , will increase to 30mg  daily - focus on medical therapy at this time, given low risk nuclear stress and CKD III would avoid cath at this time.   2. HTN -bp is at goal, continue current meds  3. Hyperlipidemia -at goal, continue statin  4. AAA Screen - >75 yo male with tobacco history, order AAA screening US        Arnoldo Lenis, M.D.

## 2016-04-29 NOTE — Patient Instructions (Signed)
Your physician wants you to follow-up in: [place] [facility] [place] DR. BRANCH  You will receive a reminder letter in the mail two months in advance. If you don't receive a letter, please call our office to schedule the follow-up appointment.  Your physician has recommended you make the following change in your medication:   INCREASE IMDUR 30 [place] physician has requested that you have an abdominal aorta duplex. During this test, an ultrasound is used to evaluate the aorta. Allow 30 minutes for this exam. Do not eat after midnight the day before and avoid carbonated beverages  Thank you for choosing [facility]!!

## 2016-05-12 DIAGNOSIS — N183 Chronic kidney disease, stage 3 (moderate): Secondary | ICD-10-CM | POA: Diagnosis not present

## 2016-05-12 DIAGNOSIS — Z79899 Other long term (current) drug therapy: Secondary | ICD-10-CM | POA: Diagnosis not present

## 2016-05-12 DIAGNOSIS — Z794 Long term (current) use of insulin: Secondary | ICD-10-CM | POA: Diagnosis not present

## 2016-05-12 DIAGNOSIS — D631 Anemia in chronic kidney disease: Secondary | ICD-10-CM | POA: Diagnosis not present

## 2016-05-12 DIAGNOSIS — E1121 Type 2 diabetes mellitus with diabetic nephropathy: Secondary | ICD-10-CM | POA: Diagnosis not present

## 2016-05-12 DIAGNOSIS — E1122 Type 2 diabetes mellitus with diabetic chronic kidney disease: Secondary | ICD-10-CM | POA: Diagnosis not present

## 2016-05-12 DIAGNOSIS — Z7982 Long term (current) use of aspirin: Secondary | ICD-10-CM | POA: Diagnosis not present

## 2016-05-12 DIAGNOSIS — I129 Hypertensive chronic kidney disease with stage 1 through stage 4 chronic kidney disease, or unspecified chronic kidney disease: Secondary | ICD-10-CM | POA: Diagnosis not present

## 2016-05-20 DIAGNOSIS — H401121 Primary open-angle glaucoma, left eye, mild stage: Secondary | ICD-10-CM | POA: Diagnosis not present

## 2016-05-27 ENCOUNTER — Ambulatory Visit: Payer: Medicare Other

## 2016-05-27 DIAGNOSIS — Z136 Encounter for screening for cardiovascular disorders: Secondary | ICD-10-CM | POA: Diagnosis not present

## 2016-06-02 ENCOUNTER — Telehealth: Payer: Self-pay | Admitting: Cardiology

## 2016-06-02 NOTE — Telephone Encounter (Signed)
Spoke with pt., advised him of his test results. He stated that he occasionally gets a cramp in his calf, but no pain other than that. I let him know I would call him back if there were any more instructions.

## 2016-06-02 NOTE — Telephone Encounter (Signed)
No additional tests needed at this time  Zandra Abts MD

## 2016-06-02 NOTE — Telephone Encounter (Signed)
Mr. Senne called stating that he received call from [facility] last week.  He was checking to see if we have results on his AAA. Please call  [phone].

## 2016-06-04 ENCOUNTER — Telehealth: Payer: Self-pay | Admitting: *Deleted

## 2016-06-04 DIAGNOSIS — N189 Chronic kidney disease, unspecified: Secondary | ICD-10-CM | POA: Diagnosis not present

## 2016-06-04 NOTE — Telephone Encounter (Signed)
Pt aware and denies pain in calves or thighs when walking - does c/o numbness and "cold" feeling in right leg only - routed to pcp and covering for Dr. Harl Bowie

## 2016-06-04 NOTE — Telephone Encounter (Signed)
-----   Message from Arnoldo Lenis, MD sent at 05/29/2016 12:54 PM EST ----- Abdominal US looks good, no evidence of aneurysm. There is some evidence of some moderate blockages in his legs. Does he get in pain in his calves of thighs with walking?  Zandra Abts MD

## 2016-06-17 DIAGNOSIS — E1122 Type 2 diabetes mellitus with diabetic chronic kidney disease: Secondary | ICD-10-CM | POA: Diagnosis not present

## 2016-06-17 DIAGNOSIS — N183 Chronic kidney disease, stage 3 (moderate): Secondary | ICD-10-CM | POA: Diagnosis not present

## 2016-06-24 ENCOUNTER — Ambulatory Visit: Payer: Medicare Other | Admitting: "Endocrinology

## 2016-07-13 ENCOUNTER — Encounter: Payer: Self-pay | Admitting: "Endocrinology

## 2016-07-13 ENCOUNTER — Ambulatory Visit (INDEPENDENT_AMBULATORY_CARE_PROVIDER_SITE_OTHER): Payer: Medicare Other | Admitting: "Endocrinology

## 2016-07-13 VITALS — BP 154/82 | HR 60 | Ht 70.0 in | Wt 204.0 lb

## 2016-07-13 DIAGNOSIS — I10 Essential (primary) hypertension: Secondary | ICD-10-CM | POA: Diagnosis not present

## 2016-07-13 DIAGNOSIS — E782 Mixed hyperlipidemia: Secondary | ICD-10-CM | POA: Diagnosis not present

## 2016-07-13 DIAGNOSIS — E1122 Type 2 diabetes mellitus with diabetic chronic kidney disease: Secondary | ICD-10-CM

## 2016-07-13 DIAGNOSIS — N183 Chronic kidney disease, stage 3 (moderate): Secondary | ICD-10-CM

## 2016-07-13 LAB — HEMOGLOBIN A1C: Hemoglobin A1C: 8.5

## 2016-07-13 MED ORDER — PEN NEEDLES 31G X 8 MM MISC: 5 refills | Status: DC

## 2016-07-13 MED ORDER — INSULIN DEGLUDEC-LIRAGLUTIDE 100-3.6 UNIT-MG/ML ~~LOC~~ SOPN: 50.0000 [IU] | PEN_INJECTOR | Freq: Every day | SUBCUTANEOUS | 2 refills | Status: DC

## 2016-07-13 NOTE — Patient Instructions (Signed)

## 2016-07-13 NOTE — Progress Notes (Signed)
Subjective:    Patient ID: John Bishop, male    DOB: 1941-04-30. Patient is here to follow-up for management of diabetes requested by  Celedonio Savage, MD  Past Medical History:  Diagnosis Date  . Diabetes mellitus, type II ([place])   . Hyperlipidemia   . Hypertension   . Kidney disease    Past Surgical History:  Procedure Laterality Date  . EYE SURGERY     Social History   Social History  . Marital status: Married    Spouse name: N/A  . Number of children: N/A  . Years of education: N/A   Social History Main Topics  . Smoking status: Former Smoker    Quit date: 06/07/1986  . Smokeless tobacco: Never Used  . Alcohol use No  . Drug use: No  . Sexual activity: Not Asked   Other Topics Concern  . None   Social History Narrative  . None   Outpatient Encounter Prescriptions as of 07/13/2016  Medication Sig  . [DISCONTINUED] insulin degludec (TRESIBA FLEXTOUCH) 100 UNIT/ML SOPN FlexTouch Pen Inject 50 Units into the skin at bedtime.   Marland Kitchen aspirin 81 MG tablet Take 81 mg by mouth daily.  Marland Kitchen atorvastatin (LIPITOR) 40 MG tablet Take 1 tablet (40 mg total) by mouth daily.  . Cholecalciferol (VITAMIN D3) 2000 UNITS TABS Take by mouth 2 (two) times daily.   . Insulin Degludec-Liraglutide (XULTOPHY) 100-3.6 UNIT-MG/ML SOPN Inject 50 Units into the skin daily with breakfast.  . Insulin Pen Needle (PEN NEEDLES) 31G X 8 MM MISC Use qhs  . isosorbide mononitrate (IMDUR) 30 MG 24 hr tablet Take 1 tablet (30 mg total) by mouth daily.  Marland Kitchen losartan-hydrochlorothiazide (HYZAAR) 100-25 MG tablet Take 1 tablet by mouth daily.  . metoprolol succinate (TOPROL-XL) 50 MG 24 hr tablet Take 25 mg by mouth 2 (two) times daily. Take with or immediately following a meal.  . Omega-3 Fatty Acids (FISH OIL) 1200 MG CAPS Take by mouth 2 (two) times daily.   . vitamin C (ASCORBIC ACID) 500 MG tablet Take 500 mg by mouth daily.  . [DISCONTINUED] Insulin Glargine (LANTUS SOLOSTAR) 100 UNIT/ML Solostar Pen  Inject 50 Units into the skin at bedtime.  . [DISCONTINUED] Insulin Pen Needle (PEN NEEDLES) 31G X 8 MM MISC Use qhs  . [DISCONTINUED] JANUVIA 25 MG tablet TAKE 1 TABLET BY MOUTH EVERY DAY   No facility-administered encounter medications on file as of 07/13/2016.    ALLERGIES: No Known Allergies VACCINATION STATUS:  There is no immunization history on file for this patient.  Diabetes  He presents for his follow-up diabetic visit. He has type 2 diabetes mellitus. Onset time: He was diagnosed at approximate age of 31 years. His disease course has been improving. There are no hypoglycemic associated symptoms. Pertinent negatives for hypoglycemia include no confusion, headaches, pallor or seizures. Pertinent negatives for diabetes include no chest pain, no fatigue, no polydipsia, no polyphagia, no polyuria and no weakness. There are no hypoglycemic complications. Symptoms are improving. Diabetic complications include nephropathy. Pertinent negatives for diabetic complications include no CVA, heart disease or retinopathy. Risk factors for coronary artery disease include diabetes mellitus, dyslipidemia, male sex and tobacco exposure. Current diabetic treatment includes insulin injections and oral agent (dual therapy). He is compliant with treatment most of the time. His weight is increasing steadily. He is following a generally healthy diet. He has not had a previous visit with a dietitian. He participates in exercise intermittently. His breakfast blood glucose range  is generally 140-180 mg/dl. An ACE inhibitor/angiotensin II receptor blocker is being taken. Eye exam is current.  Hypertension  This is a chronic problem. The current episode started more than 1 year ago. Pertinent negatives include no chest pain, headaches, neck pain, palpitations or shortness of breath. Risk factors for coronary artery disease include diabetes mellitus, dyslipidemia, male gender and smoking/tobacco exposure. Past treatments  include angiotensin blockers. There is no history of CVA or retinopathy.  Hyperlipidemia  This is a chronic problem. The current episode started more than 1 year ago. Pertinent negatives include no chest pain, myalgias or shortness of breath. Current antihyperlipidemic treatment includes statins. Risk factors for coronary artery disease include dyslipidemia, diabetes mellitus, hypertension and male sex.     Review of Systems  Constitutional: Negative for fatigue and unexpected weight change.  HENT: Negative for dental problem, mouth sores and trouble swallowing.   Eyes: Negative for visual disturbance.  Respiratory: Negative for cough, choking, chest tightness, shortness of breath and wheezing.   Cardiovascular: Negative for chest pain, palpitations and leg swelling.  Gastrointestinal: Negative for abdominal distention, abdominal pain, constipation, diarrhea, nausea and vomiting.  Endocrine: Negative for polydipsia, polyphagia and polyuria.  Genitourinary: Negative for dysuria, flank pain, hematuria and urgency.  Musculoskeletal: Negative for back pain, gait problem, myalgias and neck pain.  Skin: Negative for pallor, rash and wound.  Neurological: Negative for seizures, syncope, weakness, numbness and headaches.  Psychiatric/Behavioral: Negative.  Negative for confusion and dysphoric mood.    Objective:    BP (!) 154/82   Pulse 60   Ht 5\' 10"  (1.778 m)   Wt 204 lb (92.5 kg)   BMI 29.27 kg/m   Wt Readings from Last 3 Encounters:  07/13/16 204 lb (92.5 kg)  04/29/16 209 lb 12.8 oz (95.2 kg)  02/19/16 209 lb (94.8 kg)    Physical Exam  Constitutional: He is oriented to person, place, and time. He appears well-developed and well-nourished. He is cooperative. No distress.  HENT:  Head: Normocephalic and atraumatic.  Eyes: EOM are normal.  Neck: Normal range of motion. Neck supple. No tracheal deviation present. No thyromegaly present.  Cardiovascular: Normal rate, S1 normal, S2  normal and normal heart sounds.  Exam reveals no gallop.   No murmur heard. Pulses:      Dorsalis pedis pulses are 1+ on the right side, and 1+ on the left side.       Posterior tibial pulses are 1+ on the right side, and 1+ on the left side.  Pulmonary/Chest: Breath sounds normal. No respiratory distress. He has no wheezes.  Abdominal: Soft. Bowel sounds are normal. He exhibits no distension. There is no tenderness. There is no guarding and no CVA tenderness.  Musculoskeletal: He exhibits no edema.       Right shoulder: He exhibits no swelling and no deformity.  Neurological: He is alert and oriented to person, place, and time. He has normal strength and normal reflexes. No cranial nerve deficit or sensory deficit. Gait normal.  Skin: Skin is warm and dry. No rash noted. No cyanosis. Nails show no clubbing.  Psychiatric: He has a normal mood and affect. His speech is normal and behavior is normal. Judgment and thought content normal. Cognition and memory are normal.     Diabetic Labs (most recent): Lab Results  Component Value Date   HGBA1C 8 07/24/2015   HGBA1C 8.2 (A) 04/02/2015    Completed labs from 07/24/2015 to be scanned into his records.  Assessment & Plan:  1. Diabetes mellitus with stage 3 chronic kidney disease ([place])  - Patient has currently uncontrolled symptomatic type 2 DM since  76 years of age. - His most recent A1c is higher at 8.5% increasing from 7.4%.  -His average fasting blood glucose profile is near target.   -His diabetes is complicated by stage 3 CK D and patient remains at a high risk for more acute and chronic complications of diabetes which include CAD, CVA, CKD, retinopathy, and neuropathy. These are all discussed in detail with the patient.  - I have counseled the patient on diet management and weight loss, by adopting a carbohydrate restricted/protein rich diet.  - Suggestion is made for patient to avoid simple carbohydrates   from their diet  including Cakes , Desserts, Ice Cream,  Soda (  diet and regular) , Sweet Tea , Candies,  Chips, Cookies, Artificial Sweeteners,   and "Sugar-free" Products . This will help patient to have stable blood glucose profile and potentially avoid unintended weight gain.  - I encouraged the patient to switch to  unprocessed or minimally processed complex starch and increased protein intake (animal or plant source), fruits, and vegetables.  - Patient is advised to stick to a routine mealtimes to eat 3 meals  a day and avoid unnecessary snacks ( to snack only to correct hypoglycemia).  - The patient will be scheduled with Jearld Fenton, RDN, CDE for individualized DM education.  - I have approached patient with the following individualized plan to manage diabetes and patient agrees:   -  Due to loss of control with A1c of 8.5% associated with chronic kidney disease, he will need more treatment for his diabetes. His choices are either IV prandial insulin or change his basal insulin to better product. Based on his insurance coverage, I would prescribe Xultophy 50/1.8 subcutaneously daily with breakfast,  associated with strict monitoring of glucose  daily before breakfast and at bedtime. - This product will help for both diabetes and weight control. - He is advised to discontinue or other kinds of insulin he is taking.  -Patient is encouraged to call clinic for blood glucose levels less than 70 or above 200 mg /dl. -Patient is not a candidate for metformin andSGLT2 inhibitors due to CKD.  - Patient will be considered for incretin therapy as appropriate next visit. - Patient specific target  A1c;  LDL, HDL, Triglycerides, and  Waist Circumference were discussed in detail.  2) BP/HTN: uncontrolled. Continue current medications including ACEI/ARB. 3) Lipids/HPL:  Controlled unknown, continue statins. 4)  Weight/Diet: CDE Consult is  initiated , exercise, and detailed carbohydrates information provided.  5)  Chronic Care/Health Maintenance:  -Patient is on ACEI/ARB and Statin medications and encouraged to continue to follow up with Ophthalmology, Podiatrist at least yearly or according to recommendations, and advised to   stay away from smoking. I have recommended yearly flu vaccine and pneumonia vaccination at least every 5 years; moderate intensity exercise for up to 150 minutes weekly; and  sleep for at least 7 hours a day.  -25 minutes of time was spent on the care of this patient , 50% of which was applied for counseling on diabetes complications and their preventions.  - Patient to bring meter and  blood glucose logs during their next visit.   - I advised patient to maintain close follow up with Celedonio Savage, MD for primary care needs.  Follow up plan: - Return in about 3 months (around 10/10/2016) for follow up with pre-visit  labs, meter, and logs.  Glade Lloyd, MD Phone: [phone]  Fax: [phone]   07/13/2016, 10:57 AM

## 2016-09-28 ENCOUNTER — Encounter: Payer: Self-pay | Admitting: Cardiology

## 2016-09-28 ENCOUNTER — Ambulatory Visit (INDEPENDENT_AMBULATORY_CARE_PROVIDER_SITE_OTHER): Payer: Medicare Other | Admitting: Cardiology

## 2016-09-28 VITALS — BP 132/76 | HR 77 | Ht 70.0 in | Wt 207.2 lb

## 2016-09-28 DIAGNOSIS — I739 Peripheral vascular disease, unspecified: Secondary | ICD-10-CM

## 2016-09-28 DIAGNOSIS — I10 Essential (primary) hypertension: Secondary | ICD-10-CM

## 2016-09-28 DIAGNOSIS — I25118 Atherosclerotic heart disease of native coronary artery with other forms of angina pectoris: Secondary | ICD-10-CM

## 2016-09-28 DIAGNOSIS — E782 Mixed hyperlipidemia: Secondary | ICD-10-CM

## 2016-09-28 DIAGNOSIS — I209 Angina pectoris, unspecified: Secondary | ICD-10-CM | POA: Diagnosis not present

## 2016-09-28 DIAGNOSIS — Z794 Long term (current) use of insulin: Secondary | ICD-10-CM

## 2016-09-28 DIAGNOSIS — E118 Type 2 diabetes mellitus with unspecified complications: Secondary | ICD-10-CM | POA: Diagnosis not present

## 2016-09-28 MED ORDER — AMLODIPINE BESYLATE 10 MG PO TABS: 10.0000 mg | ORAL_TABLET | Freq: Every day | ORAL | 1 refills | Status: DC

## 2016-09-28 NOTE — Patient Instructions (Signed)
Your physician wants you to follow-up in: John Bishop will receive a reminder letter in the mail two months in advance. If you don't receive a letter, please call our office to schedule the follow-up appointment.  Your physician has recommended you make the following change in your medication:   INCREASE AMLODIPINE 10 MG DAILY  Thank you for choosing [facility]!!

## 2016-09-28 NOTE — Progress Notes (Signed)
Clinical Summary John Bishop is a 76 y.o.male seen today for follow up of the following medical problems.  1. Chest pain - several year history of chest pain - occurs with exertion only. Tightness mid to left chest, 7/10. No other associated symptoms. Comes on 4 laps around track, can keep going 6-9 laps. Then pain resolves. Overall stable in severity and frequency.  - 12/2015 lexiscan showed small area of mild ischemia inferior wall, LVEF 50%. Low risk study.  - 02/2016 echo LVEF 55-60%, no WMAs, grade I diastolic dysfunction CAD risk factors: DM2, hyperlipidemia, HTN, former tobacco, father MI 68s   - last visit we increased imdur to 30mg  daily. Chest pain continues to improve,occurs with only very heavy exertion -compliant with meds    2. CKD III - followed by pcp.  - upcoming labs in next few weeks with John Bishop. - followed by John Bishop nephrology.   3. Hyperlipidemia - 02/2016 HDL 40 LDL 78 TC 134 TG 81 - compliant with statin  4. HTN - no ACE-I due to poor renal funciton - home bp's 140-160s/70-80s   5. DM2  - followed by endocrinology John Bishop - last HgbA1c 8.5 in Jan 2018  6. AAA screen - history of tobacco abuse - no evidence of aneurysm by 05/2016 [place], did show some PAD. He denies claudication.       Past Medical History:  Diagnosis Date  . Diabetes mellitus, type II ([place])   . Hyperlipidemia   . Hypertension   . Kidney disease      No Known Allergies   Current Outpatient Prescriptions  Medication Sig Dispense Refill  . aspirin 81 MG tablet Take 81 mg by mouth daily.    Marland Kitchen atorvastatin (LIPITOR) 40 MG tablet Take 1 tablet (40 mg total) by mouth daily. 90 tablet 3  . Cholecalciferol (VITAMIN D3) 2000 UNITS TABS Take by mouth 2 (two) times daily.     . Insulin Degludec-Liraglutide (XULTOPHY) 100-3.6 UNIT-MG/ML SOPN Inject 50 Units into the skin daily with breakfast. 5 pen 2  . Insulin Pen Needle (PEN NEEDLES) 31G X 8 MM MISC Use qhs 100 each 5    . isosorbide mononitrate (IMDUR) 30 MG 24 hr tablet Take 1 tablet (30 mg total) by mouth daily. 90 tablet 3  . losartan-hydrochlorothiazide (HYZAAR) 100-25 MG tablet Take 1 tablet by mouth daily.    . metoprolol succinate (TOPROL-XL) 50 MG 24 hr tablet Take 25 mg by mouth 2 (two) times daily. Take with or immediately following a meal.    . Omega-3 Fatty Acids (FISH OIL) 1200 MG CAPS Take by mouth 2 (two) times daily.     . vitamin C (ASCORBIC ACID) 500 MG tablet Take 500 mg by mouth daily.     No current facility-administered medications for this visit.      Past Surgical History:  Procedure Laterality Date  . EYE SURGERY       No Known Allergies    Family History  Problem Relation Age of Onset  . Diabetes Other   . Heart disease Other   . Hyperlipidemia Other   . Hypertension Other      Social History John Bishop reports that he quit smoking about 30 years ago. He has never used smokeless tobacco. John Bishop reports that he does not drink alcohol.   Review of Systems CONSTITUTIONAL: No weight loss, fever, chills, weakness or fatigue.  HEENT: Eyes: No visual loss, blurred vision, double vision or yellow sclerae.No hearing  loss, sneezing, congestion, runny nose or sore throat.  SKIN: No rash or itching.  CARDIOVASCULAR: per HPI RESPIRATORY: No shortness of breath, cough or sputum.  GASTROINTESTINAL: No anorexia, nausea, vomiting or diarrhea. No abdominal pain or blood.  GENITOURINARY: No burning on urination, no polyuria NEUROLOGICAL: No headache, dizziness, syncope, paralysis, ataxia, numbness or tingling in the extremities. No change in bowel or bladder control.  MUSCULOSKELETAL: No muscle, back pain, joint pain or stiffness.  LYMPHATICS: No enlarged nodes. No history of splenectomy.  PSYCHIATRIC: No history of depression or anxiety.  ENDOCRINOLOGIC: No reports of sweating, cold or heat intolerance. No polyuria or polydipsia.  Marland Kitchen   Physical Examination Vitals:    09/28/16 1034  BP: 132/76  Pulse: 77   Vitals:   09/28/16 1034  Weight: 207 lb 3.2 oz (94 kg)  Height: 5\' 10"  (1.778 m)    Gen: resting comfortably, no acute distress HEENT: no scleral icterus, pupils equal round and reactive, no palptable cervical adenopathy,  CV: RRR, no m/gr, no jvd Resp: Clear to auscultation bilaterally GI: abdomen is soft, non-tender, non-distended, normal bowel sounds, no hepatosplenomegaly MSK: extremities are warm, no edema.  Skin: warm, no rash Neuro:  no focal deficits Psych: appropriate affect   Diagnostic Studies  02/2016 echo Study Conclusions  - Left ventricle: The cavity size was normal. Wall thickness was normal. Systolic function was normal. The estimated ejection fraction was in the range of 55% to 60%. Wall motion was normal; there were no regional wall motion abnormalities. Doppler parameters are consistent with abnormal left ventricular relaxation (grade 1 diastolic dysfunction). - Aortic valve: Mildly calcified annulus. Trileaflet; normal thickness leaflets. Valve area (VTI): 2.41 cm^2. Valve area (Vmax): 2.35 cm^2. Valve area (Vmean): 2.05 cm^2. - Mitral valve: Mildly calcified annulus. Normal thickness leaflets . - Technically adequate study.  12/2015 MPI -small focus of ischemia inferior wall, LVEF 50%, low risk.   05/2016 AAA [place] No AAA  Assessment and Plan   1. Chest pain/CAD with stable angina - symptoms consistent with stable angina. Abnormal nuclear stress test overall low risk - symptoms improving with tiration of antianginals. Increase norvasc to 10mg  daily - in setting of low risk nuclear stress, CKD III, and overall controlled symptoms continue medical therapy at this time, no plans for cath.   2. HTN -elevated by home numbers. - we will increase norvasc to 10mg  daily   3. Hyperlipidemia -at goal, he will continue statin  4. PAD - noted on recent AAA [place], he denies any symptoms -  continue medical therapy.   5. DM2 - not at goal, followed by John Bishop with upcoming appt   John Bishop, M.D.

## 2016-10-05 ENCOUNTER — Encounter: Payer: Self-pay | Admitting: "Endocrinology

## 2016-10-05 DIAGNOSIS — N183 Chronic kidney disease, stage 3 (moderate): Secondary | ICD-10-CM | POA: Diagnosis not present

## 2016-10-05 DIAGNOSIS — E1122 Type 2 diabetes mellitus with diabetic chronic kidney disease: Secondary | ICD-10-CM | POA: Diagnosis not present

## 2016-10-05 LAB — HEMOGLOBIN A1C: HEMOGLOBIN A1C: 8.4

## 2016-10-13 ENCOUNTER — Ambulatory Visit (INDEPENDENT_AMBULATORY_CARE_PROVIDER_SITE_OTHER): Payer: Medicare Other | Admitting: "Endocrinology

## 2016-10-13 ENCOUNTER — Encounter: Payer: Self-pay | Admitting: "Endocrinology

## 2016-10-13 ENCOUNTER — Ambulatory Visit: Payer: Medicare Other | Admitting: "Endocrinology

## 2016-10-13 VITALS — BP 138/75 | HR 73 | Ht 70.0 in | Wt 207.0 lb

## 2016-10-13 DIAGNOSIS — I10 Essential (primary) hypertension: Secondary | ICD-10-CM

## 2016-10-13 DIAGNOSIS — I25118 Atherosclerotic heart disease of native coronary artery with other forms of angina pectoris: Secondary | ICD-10-CM | POA: Diagnosis not present

## 2016-10-13 DIAGNOSIS — N183 Chronic kidney disease, stage 3 (moderate): Secondary | ICD-10-CM | POA: Diagnosis not present

## 2016-10-13 DIAGNOSIS — E782 Mixed hyperlipidemia: Secondary | ICD-10-CM

## 2016-10-13 DIAGNOSIS — E1122 Type 2 diabetes mellitus with diabetic chronic kidney disease: Secondary | ICD-10-CM | POA: Diagnosis not present

## 2016-10-13 MED ORDER — INSULIN PEN NEEDLE 32G X 4 MM MISC: 2 refills | Status: DC

## 2016-10-13 NOTE — Progress Notes (Signed)
Subjective:    Patient ID: John Bishop, male    DOB: 10-Jul-1940. Patient is here to follow-up for management of diabetes requested by  Celedonio Savage, MD  Past Medical History:  Diagnosis Date  . Diabetes mellitus, type II ([place])   . Hyperlipidemia   . Hypertension   . Kidney disease    Past Surgical History:  Procedure Laterality Date  . EYE SURGERY     Social History   Social History  . Marital status: Married    Spouse name: N/A  . Number of children: N/A  . Years of education: N/A   Social History Main Topics  . Smoking status: Former Smoker    Quit date: 06/07/1986  . Smokeless tobacco: Never Used  . Alcohol use No  . Drug use: No  . Sexual activity: Not Asked   Other Topics Concern  . None   Social History Narrative  . None   Outpatient Encounter Prescriptions as of 10/13/2016  Medication Sig  . amLODipine (NORVASC) 10 MG tablet Take 1 tablet (10 mg total) by mouth daily.  Marland Kitchen aspirin 81 MG tablet Take 81 mg by mouth daily.  Marland Kitchen atorvastatin (LIPITOR) 40 MG tablet Take 40 mg by mouth daily.  . Cholecalciferol (VITAMIN D3) 2000 UNITS TABS Take by mouth 2 (two) times daily.   . Insulin Degludec-Liraglutide (XULTOPHY) 100-3.6 UNIT-MG/ML SOPN Inject 50 Units into the skin daily with breakfast.  . isosorbide mononitrate (IMDUR) 30 MG 24 hr tablet Take 30 mg by mouth daily.  Marland Kitchen losartan-hydrochlorothiazide (HYZAAR) 100-25 MG tablet Take 1 tablet by mouth daily.  . metoprolol succinate (TOPROL-XL) 50 MG 24 hr tablet Take 25 mg by mouth 2 (two) times daily. Take with or immediately following a meal.  . Omega-3 Fatty Acids (FISH OIL) 1200 MG CAPS Take by mouth 2 (two) times daily.   . vitamin C (ASCORBIC ACID) 500 MG tablet Take 500 mg by mouth daily.  . Insulin Pen Needle (NOVOFINE PLUS) 32G X 4 MM MISC As directed qam  . Insulin Pen Needle (PEN NEEDLES) 31G X 8 MM MISC Use qhs   No facility-administered encounter medications on file as of 10/13/2016.    ALLERGIES: No  Known Allergies VACCINATION STATUS:  There is no immunization history on file for this patient.  Diabetes  He presents for his follow-up diabetic visit. He has type 2 diabetes mellitus. Onset time: He was diagnosed at approximate age of 66 years. His disease course has been stable. There are no hypoglycemic associated symptoms. Pertinent negatives for hypoglycemia include no confusion, headaches, pallor or seizures. Pertinent negatives for diabetes include no chest pain, no fatigue, no polydipsia, no polyphagia, no polyuria and no weakness. There are no hypoglycemic complications. Symptoms are stable. Diabetic complications include nephropathy. Pertinent negatives for diabetic complications include no CVA, heart disease or retinopathy. Risk factors for coronary artery disease include diabetes mellitus, dyslipidemia, male sex and tobacco exposure. Current diabetic treatment includes insulin injections and oral agent (dual therapy). He is compliant with treatment most of the time. His weight is increasing steadily. He is following a generally healthy diet. He has not had a previous visit with a dietitian. He participates in exercise intermittently. His breakfast blood glucose range is generally 140-180 mg/dl. An ACE inhibitor/angiotensin II receptor blocker is being taken. Eye exam is current.  Hypertension  This is a chronic problem. The current episode started more than 1 year ago. Pertinent negatives include no chest pain, headaches, neck pain, palpitations  or shortness of breath. Risk factors for coronary artery disease include diabetes mellitus, dyslipidemia, male gender and smoking/tobacco exposure. Past treatments include angiotensin blockers. There is no history of CVA or retinopathy.  Hyperlipidemia  This is a chronic problem. The current episode started more than 1 year ago. Pertinent negatives include no chest pain, myalgias or shortness of breath. Current antihyperlipidemic treatment includes  statins. Risk factors for coronary artery disease include dyslipidemia, diabetes mellitus, hypertension and male sex.     Review of Systems  Constitutional: Negative for fatigue and unexpected weight change.  HENT: Negative for dental problem, mouth sores and trouble swallowing.   Eyes: Negative for visual disturbance.  Respiratory: Negative for cough, choking, chest tightness, shortness of breath and wheezing.   Cardiovascular: Negative for chest pain, palpitations and leg swelling.  Gastrointestinal: Negative for abdominal distention, abdominal pain, constipation, diarrhea, nausea and vomiting.  Endocrine: Negative for polydipsia, polyphagia and polyuria.  Genitourinary: Negative for dysuria, flank pain, hematuria and urgency.  Musculoskeletal: Negative for back pain, gait problem, myalgias and neck pain.  Skin: Negative for pallor, rash and wound.  Neurological: Negative for seizures, syncope, weakness, numbness and headaches.  Psychiatric/Behavioral: Negative.  Negative for confusion and dysphoric mood.    Objective:    BP 138/75   Pulse 73   Ht 5\' 10"  (1.778 m)   Wt 207 lb (93.9 kg)   BMI 29.70 kg/m   Wt Readings from Last 3 Encounters:  10/13/16 207 lb (93.9 kg)  09/28/16 207 lb 3.2 oz (94 kg)  07/13/16 204 lb (92.5 kg)    Physical Exam  Constitutional: He is oriented to person, place, and time. He appears well-developed and well-nourished. He is cooperative. No distress.  HENT:  Head: Normocephalic and atraumatic.  Eyes: EOM are normal.  Neck: Normal range of motion. Neck supple. No tracheal deviation present. No thyromegaly present.  Cardiovascular: Normal rate, S1 normal, S2 normal and normal heart sounds.  Exam reveals no gallop.   No murmur heard. Pulses:      Dorsalis pedis pulses are 1+ on the right side, and 1+ on the left side.       Posterior tibial pulses are 1+ on the right side, and 1+ on the left side.  Pulmonary/Chest: Breath sounds normal. No  respiratory distress. He has no wheezes.  Abdominal: Soft. Bowel sounds are normal. He exhibits no distension. There is no tenderness. There is no guarding and no CVA tenderness.  Musculoskeletal: He exhibits no edema.       Right shoulder: He exhibits no swelling and no deformity.  Neurological: He is alert and oriented to person, place, and time. He has normal strength and normal reflexes. No cranial nerve deficit or sensory deficit. Gait normal.  Skin: Skin is warm and dry. No rash noted. No cyanosis. Nails show no clubbing.  Psychiatric: He has a normal mood and affect. His speech is normal and behavior is normal. Judgment and thought content normal. Cognition and memory are normal.     Diabetic Labs (most recent): Lab Results  Component Value Date   HGBA1C 8.4 10/05/2016   HGBA1C 8.5 07/13/2016   HGBA1C 8 07/24/2015    Completed labs from 07/24/2015 to be scanned into his records.  Assessment & Plan:   1. Diabetes mellitus with stage 3 chronic kidney disease ([place])  - Patient has currently uncontrolled symptomatic type 2 DM since  76 years of age. - His most recent A1c is stable at 8.4%.  -His average fasting  blood glucose profile is near target.   -His diabetes is complicated by stage 3 CK D and patient remains at a high risk for more acute and chronic complications of diabetes which include CAD, CVA, CKD, retinopathy, and neuropathy. These are all discussed in detail with the patient.  - I have counseled the patient on diet management and weight loss, by adopting a carbohydrate restricted/protein rich diet.  - Suggestion is made for patient to avoid simple carbohydrates   from their diet including Cakes , Desserts, Ice Cream,  Soda (  diet and regular) , Sweet Tea , Candies,  Chips, Cookies, Artificial Sweeteners,   and "Sugar-free" Products . This will help patient to have stable blood glucose profile and potentially avoid unintended weight gain.  - I encouraged the patient  to switch to  unprocessed or minimally processed complex starch and increased protein intake (animal or plant source), fruits, and vegetables.  - Patient is advised to stick to a routine mealtimes to eat 3 meals  a day and avoid unnecessary snacks ( to snack only to correct hypoglycemia).  - The patient will be scheduled with Jearld Fenton, RDN, CDE for individualized DM education.  - I have approached patient with the following individualized plan to manage diabetes and patient agrees:   -  I will continue Xultophy 50/1.8 subcutaneously daily with breakfast,  associated with strict monitoring of glucose  daily before breakfast and at bedtime. - This product will help for both diabetes and weight control. - He is advised to discontinue or other kinds of insulin he is taking.  -Patient is encouraged to call clinic for blood glucose levels less than 70 or above 200 mg /dl. -Patient is not a candidate for metformin andSGLT2 inhibitors due to CKD- renal function is improving GFR at 49.  - Patient will be considered for incretin therapy as appropriate next visit. - Patient specific target  A1c;  LDL, HDL, Triglycerides, and  Waist Circumference were discussed in detail.  2) BP/HTN: uncontrolled. Continue current medications including ACEI/ARB. 3) Lipids/HPL:  Controlled unknown, continue statins. 4)  Weight/Diet: CDE Consult is  initiated , exercise, and detailed carbohydrates information provided.  5) Chronic Care/Health Maintenance:  -Patient is on ACEI/ARB and Statin medications and encouraged to continue to follow up with Ophthalmology, nephrology, Podiatrist at least yearly or according to recommendations, and advised to   stay away from smoking. I have recommended yearly flu vaccine and pneumonia vaccination at least every 5 years; moderate intensity exercise for up to 150 minutes weekly; and  sleep for at least 7 hours a day.  -25 minutes of time was spent on the care of this patient ,  50% of which was applied for counseling on diabetes complications and their preventions.  - Patient to bring meter and  blood glucose logs during his next visit.   - I advised patient to maintain close follow up with Celedonio Savage, MD for primary care needs.  Follow up plan: - Return in about 3 months (around 01/13/2017) for follow up with pre-visit labs, meter, and logs.  Glade Lloyd, MD Phone: [phone]  Fax: [phone]   10/13/2016, 10:56 AM

## 2016-10-13 NOTE — Patient Instructions (Signed)

## 2016-11-18 DIAGNOSIS — E119 Type 2 diabetes mellitus without complications: Secondary | ICD-10-CM | POA: Diagnosis not present

## 2016-11-18 DIAGNOSIS — H401121 Primary open-angle glaucoma, left eye, mild stage: Secondary | ICD-10-CM | POA: Diagnosis not present

## 2016-11-30 ENCOUNTER — Other Ambulatory Visit: Payer: Self-pay

## 2016-11-30 MED ORDER — GLUCOSE BLOOD VI STRP: ORAL_STRIP | 2 refills | Status: DC

## 2016-12-23 ENCOUNTER — Other Ambulatory Visit: Payer: Self-pay | Admitting: "Endocrinology

## 2017-01-07 DIAGNOSIS — N183 Chronic kidney disease, stage 3 (moderate): Secondary | ICD-10-CM | POA: Diagnosis not present

## 2017-01-07 DIAGNOSIS — E1122 Type 2 diabetes mellitus with diabetic chronic kidney disease: Secondary | ICD-10-CM | POA: Diagnosis not present

## 2017-01-07 LAB — BASIC METABOLIC PANEL
BUN: 23 — AB (ref 4–21)
CREATININE: 1.6 — AB (ref <=1.3)

## 2017-01-07 LAB — HEMOGLOBIN A1C: Hemoglobin A1C: 6.8

## 2017-01-14 ENCOUNTER — Encounter: Payer: Self-pay | Admitting: "Endocrinology

## 2017-01-14 ENCOUNTER — Ambulatory Visit (INDEPENDENT_AMBULATORY_CARE_PROVIDER_SITE_OTHER): Payer: Medicare Other | Admitting: "Endocrinology

## 2017-01-14 VITALS — BP 149/82 | HR 77 | Wt 207.0 lb

## 2017-01-14 DIAGNOSIS — I25118 Atherosclerotic heart disease of native coronary artery with other forms of angina pectoris: Secondary | ICD-10-CM | POA: Diagnosis not present

## 2017-01-14 DIAGNOSIS — N183 Chronic kidney disease, stage 3 (moderate): Secondary | ICD-10-CM | POA: Diagnosis not present

## 2017-01-14 DIAGNOSIS — E1122 Type 2 diabetes mellitus with diabetic chronic kidney disease: Secondary | ICD-10-CM | POA: Diagnosis not present

## 2017-01-14 DIAGNOSIS — I10 Essential (primary) hypertension: Secondary | ICD-10-CM

## 2017-01-14 DIAGNOSIS — E782 Mixed hyperlipidemia: Secondary | ICD-10-CM

## 2017-01-14 NOTE — Patient Instructions (Signed)

## 2017-01-14 NOTE — Progress Notes (Signed)
Subjective:    Patient ID: [name], male    DOB: 1941-05-30. Patient is here to follow-up for management of diabetes  Complicated by stage 3 renal insufficiency.   Past Medical History:  Diagnosis Date  . Diabetes mellitus, type II ([place])   . Hyperlipidemia   . Hypertension   . Kidney disease    Past Surgical History:  Procedure Laterality Date  . EYE SURGERY     Social History   Social History  . Marital status: Married    Spouse name: N/A  . Number of children: N/A  . Years of education: N/A   Social History Main Topics  . Smoking status: Former Smoker    Quit date: 06/07/1986  . Smokeless tobacco: Never Used  . Alcohol use No  . Drug use: No  . Sexual activity: Not Asked   Other Topics Concern  . None   Social History Narrative  . None   Outpatient Encounter Prescriptions as of 01/14/2017  Medication Sig  . aspirin 81 MG tablet Take 81 mg by mouth daily.  Marland Kitchen atorvastatin (LIPITOR) 40 MG tablet Take 40 mg by mouth daily.  . Cholecalciferol (VITAMIN D3) 2000 UNITS TABS Take by mouth 2 (two) times daily.   Marland Kitchen glucose blood (CONTOUR NEXT TEST) test strip Use as instructed tid. E11.65  . Insulin Pen Needle (PEN NEEDLES) 31G X 8 MM MISC Use qhs  . isosorbide mononitrate (IMDUR) 30 MG 24 hr tablet Take 30 mg by mouth daily.  Marland Kitchen losartan-hydrochlorothiazide (HYZAAR) 100-25 MG tablet Take 1 tablet by mouth daily.  . metoprolol succinate (TOPROL-XL) 50 MG 24 hr tablet Take 25 mg by mouth 2 (two) times daily. Take with or immediately following a meal.  . Omega-3 Fatty Acids (FISH OIL) 1200 MG CAPS Take by mouth 2 (two) times daily.   . vitamin C (ASCORBIC ACID) 500 MG tablet Take 500 mg by mouth daily.  [name] 100-3.6 UNIT-MG/ML SOPN INJECT 50 UNITS INTO THE SKIN DAILY WITH BREAKFAST.  . [DISCONTINUED] Insulin Pen Needle (NOVOFINE PLUS) 32G X 4 MM MISC As directed qam  . amLODipine (NORVASC) 10 MG tablet Take 1 tablet (10 mg total) by mouth daily.   No  facility-administered encounter medications on file as of 01/14/2017.    ALLERGIES: No Known Allergies VACCINATION STATUS:  There is no immunization history on file for this patient.  Diabetes  He presents for his follow-up diabetic visit. He has type 2 diabetes mellitus. Onset time: He was diagnosed at approximate age of 68 years. His disease course has been improving. There are no hypoglycemic associated symptoms. Pertinent negatives for hypoglycemia include no confusion, headaches, pallor or seizures. Pertinent negatives for diabetes include no chest pain, no fatigue, no polydipsia, no polyphagia, no polyuria and no weakness. There are no hypoglycemic complications. Symptoms are improving. Diabetic complications include nephropathy. Pertinent negatives for diabetic complications include no CVA, heart disease or retinopathy. Risk factors for coronary artery disease include diabetes mellitus, dyslipidemia, male sex and tobacco exposure. Current diabetic treatment includes insulin injections and oral agent (dual therapy). He is compliant with treatment most of the time. His weight is stable. He is following a generally healthy diet. He has not had a previous visit with a dietitian. He participates in exercise intermittently. His breakfast blood glucose range is generally 140-180 mg/dl. His overall blood glucose range is 140-180 mg/dl. An ACE inhibitor/angiotensin II receptor blocker is being taken. Eye exam is current.  Hypertension  This is  a chronic problem. The current episode started more than 1 year ago. Pertinent negatives include no chest pain, headaches, neck pain, palpitations or shortness of breath. Risk factors for coronary artery disease include diabetes mellitus, dyslipidemia, male gender and smoking/tobacco exposure. Past treatments include angiotensin blockers. There is no history of CVA or retinopathy.  Hyperlipidemia  This is a chronic problem. The current episode started more than 1 year  ago. Pertinent negatives include no chest pain, myalgias or shortness of breath. Current antihyperlipidemic treatment includes statins. Risk factors for coronary artery disease include dyslipidemia, diabetes mellitus, hypertension and male sex.     Review of Systems  Constitutional: Negative for fatigue and unexpected weight change.  HENT: Negative for dental problem, mouth sores and trouble swallowing.   Eyes: Negative for visual disturbance.  Respiratory: Negative for cough, choking, chest tightness, shortness of breath and wheezing.   Cardiovascular: Negative for chest pain, palpitations and leg swelling.  Gastrointestinal: Negative for abdominal distention, abdominal pain, constipation, diarrhea, nausea and vomiting.  Endocrine: Negative for polydipsia, polyphagia and polyuria.  Genitourinary: Negative for dysuria, flank pain, hematuria and urgency.  Musculoskeletal: Negative for back pain, gait problem, myalgias and neck pain.  Skin: Negative for pallor, rash and wound.  Neurological: Negative for seizures, syncope, weakness, numbness and headaches.  Psychiatric/Behavioral: Negative.  Negative for confusion and dysphoric mood.    Objective:    BP (!) 149/82   Pulse 77   Wt 207 lb (93.9 kg)   SpO2 98%   BMI 29.70 kg/m   Wt Readings from Last 3 Encounters:  01/14/17 207 lb (93.9 kg)  10/13/16 207 lb (93.9 kg)  09/28/16 207 lb 3.2 oz (94 kg)    Physical Exam  Constitutional: He is oriented to person, place, and time. He appears well-developed and well-nourished. He is cooperative. No distress.  HENT:  Head: Normocephalic and atraumatic.  Eyes: EOM are normal.  Neck: Normal range of motion. Neck supple. No tracheal deviation present. No thyromegaly present.  Cardiovascular: Normal rate, S1 normal, S2 normal and normal heart sounds.  Exam reveals no gallop.   No murmur heard. Pulses:      Dorsalis pedis pulses are 1+ on the right side, and 1+ on the left side.        Posterior tibial pulses are 1+ on the right side, and 1+ on the left side.  Pulmonary/Chest: Breath sounds normal. No respiratory distress. He has no wheezes.  Abdominal: Soft. Bowel sounds are normal. He exhibits no distension. There is no tenderness. There is no guarding and no CVA tenderness.  Musculoskeletal: He exhibits no edema.       Right shoulder: He exhibits no swelling and no deformity.  Neurological: He is alert and oriented to person, place, and time. He has normal strength and normal reflexes. No cranial nerve deficit or sensory deficit. Gait normal.  Skin: Skin is warm and dry. No rash noted. No cyanosis. Nails show no clubbing.  Psychiatric: He has a normal mood and affect. His speech is normal and behavior is normal. Judgment and thought content normal. Cognition and memory are normal.     Diabetic Labs (most recent): Lab Results  Component Value Date   HGBA1C 8.4 10/05/2016   HGBA1C 8.5 07/13/2016   HGBA1C 8 07/24/2015    Completed labs from 07/24/2015 to be scanned into his records.  Assessment & Plan:   1. Diabetes mellitus with stage 3 chronic kidney disease ([place])  - Patient has currently controlled asymptomatic type 2  DM since  76 years of age. - His most recent A1c is better at 6.8% from  8.4%. -His average fasting blood glucose profile is near target.   -His diabetes is complicated by stage 3 CKD ( which is now improving with creatinine 1.59 from 1.9 and GFR 52 from 42) . - He remains at a high risk for more acute and chronic complications of diabetes which include CAD, CVA, CKD, retinopathy, and neuropathy. These are all discussed in detail with the patient.  - I have counseled the patient on diet management and weight loss, by adopting a carbohydrate restricted/protein rich diet.  - Suggestion is made for patient to avoid simple carbohydrates   from his diet including Cakes , Desserts, Ice Cream,  Soda (  diet and regular) , Sweet Tea , Candies,  Chips,  Cookies, Artificial Sweeteners,   and "Sugar-free" Products . This will help patient to have stable blood glucose profile and potentially avoid unintended weight gain.  - I encouraged the patient to switch to  unprocessed or minimally processed complex starch and increased protein intake (animal or plant source), fruits, and vegetables.  - Patient is advised to stick to a routine mealtimes to eat 3 meals  a day and avoid unnecessary snacks ( to snack only to correct hypoglycemia).   - I have approached patient with the following individualized plan to manage diabetes and patient agrees:   -  He has benefited from [place], however due to a lapse in his insurance, he is being asked to pay for [place]. - I gave him enough samples for now.  I will continue Xultophy 50/1.8 subcutaneously daily with breakfast,  associated with strict monitoring of glucose  daily before breakfast and at bedtime. - This product will help for both diabetes and weight control ( also gave him samples of Soliqua to use when he runs out of Xultophy  to cover his insurance gap).  -Patient is encouraged to call clinic for blood glucose levels less than 70 or above 200 mg /dl. -Patient is not a candidate for metformin andSGLT2 inhibitors due to CKD- renal function is improving GFR at 52.  - Patient specific target  A1c;  LDL, HDL, Triglycerides, and  Waist Circumference were discussed in detail.  2) BP/HTN: uncontrolled. Continue current medications including ACEI/ARB. He has amlodipine, losartan and metoprolol. 3) Lipids/HPL:  Controlled unknown, continue statins. 4)  Weight/Diet: CDE Consult is  initiated , exercise, and detailed carbohydrates information provided.  5) Chronic Care/Health Maintenance:  -Patient is on ACEI/ARB and Statin medications and encouraged to continue to follow up with Ophthalmology, nephrology, Podiatrist at least yearly or according to recommendations, and advised to   stay away from smoking. I have  recommended yearly flu vaccine and pneumonia vaccination at least every 5 years; moderate intensity exercise for up to 150 minutes weekly; and  sleep for at least 7 hours a day.  -20 minutes of time was spent on the care of this patient , 50% of which was applied for counseling on diabetes complications and their preventions.  - Patient to bring meter and  blood glucose logs during his next visit.   - I advised patient to maintain close follow up with PMD  for primary care needs.  Follow up plan: - Return in about 3 months (around 04/16/2017) for meter, and logs.  Glade Lloyd, MD Phone: [phone]  Fax: [phone]   01/14/2017, 12:25 PM

## 2017-01-19 ENCOUNTER — Other Ambulatory Visit: Payer: Self-pay | Admitting: Cardiology

## 2017-01-27 ENCOUNTER — Encounter: Payer: Self-pay | Admitting: Cardiology

## 2017-01-27 ENCOUNTER — Ambulatory Visit (INDEPENDENT_AMBULATORY_CARE_PROVIDER_SITE_OTHER): Payer: Medicare Other | Admitting: Cardiology

## 2017-01-27 VITALS — BP 138/76 | HR 74 | Ht 70.0 in | Wt 206.2 lb

## 2017-01-27 DIAGNOSIS — Z794 Long term (current) use of insulin: Secondary | ICD-10-CM | POA: Diagnosis not present

## 2017-01-27 DIAGNOSIS — E782 Mixed hyperlipidemia: Secondary | ICD-10-CM | POA: Diagnosis not present

## 2017-01-27 DIAGNOSIS — I25118 Atherosclerotic heart disease of native coronary artery with other forms of angina pectoris: Secondary | ICD-10-CM

## 2017-01-27 DIAGNOSIS — I209 Angina pectoris, unspecified: Secondary | ICD-10-CM

## 2017-01-27 DIAGNOSIS — E118 Type 2 diabetes mellitus with unspecified complications: Secondary | ICD-10-CM | POA: Diagnosis not present

## 2017-01-27 DIAGNOSIS — I10 Essential (primary) hypertension: Secondary | ICD-10-CM

## 2017-01-27 DIAGNOSIS — I739 Peripheral vascular disease, unspecified: Secondary | ICD-10-CM

## 2017-01-27 MED ORDER — METOPROLOL SUCCINATE ER 25 MG PO TB24: 37.5000 mg | ORAL_TABLET | Freq: Two times a day (BID) | ORAL | 6 refills | Status: DC

## 2017-01-27 NOTE — Patient Instructions (Signed)
Your physician wants you to follow-up in: [place] will receive a reminder letter in the mail two months in advance. If you don't receive a letter, please call our office to schedule the follow-up appointment.  Your physician has recommended you make the following change in your medication:   INCREASE TOPROL XL 37.5 MG (1 & 1/2 TABLETS) TWICE DAILY  Thank you for choosing [facility]!!

## 2017-01-27 NOTE — Progress Notes (Signed)
Clinical Summary Mr. Mangel is a 76 y.o.male seen today for follow up of the following medical problems.  1. Chest pain - several year history of chest pain - occurs with exertion only. Tightness mid to left chest, 7/10. No other associated symptoms. Comes on 4 laps around track, can keep going 6-9 laps. Then pain resolves. Overall stable in severity and frequency.  - 12/2015 lexiscan showed small area of mild ischemia inferior wall, LVEF 50%. Low risk study.  - 02/2016 echo LVEF 55-60%, no WMAs, grade I diastolic dysfunction CAD risk factors: DM2, hyperlipidemia, HTN, former tobacco, father MI 8s    - denies any recent chest pain. No SOB or DOE - compliant with meds  2. CKD III - followed by pcp.  - upcoming labs in next few weeks with Dr Dorris Fetch. - followed by [facility] nephrology.   3. Hyperlipidemia - 02/2016 HDL 40 LDL 78 TC 134 TG 81 -he remains compliant with his statin  4. HTN - no ACE-I due to poor renal funciton - home bp's 130s/70 s  5. DM2 - followed by endocrinology Dr Dorris Fetch - last HgbA1c 8.5 in Jan 2018  6. AAA screen - history of tobacco abuse - no evidence of aneurysm by 05/2016 [place], did show some PAD. He denies claudication.     Past Medical History:  Diagnosis Date  . Diabetes mellitus, type II ([place])   . Hyperlipidemia   . Hypertension   . Kidney disease      No Known Allergies   Current Outpatient Prescriptions  Medication Sig Dispense Refill  . amLODipine (NORVASC) 10 MG tablet Take 1 tablet (10 mg total) by mouth daily. 90 tablet 1  . aspirin 81 MG tablet Take 81 mg by mouth daily.    Marland Kitchen atorvastatin (LIPITOR) 40 MG tablet Take 40 mg by mouth daily.    Marland Kitchen atorvastatin (LIPITOR) 40 MG tablet TAKE 1 TABLET BY MOUTH EVERY DAY 90 tablet 1  . Cholecalciferol (VITAMIN D3) 2000 UNITS TABS Take by mouth 2 (two) times daily.     Marland Kitchen glucose blood (CONTOUR NEXT TEST) test strip Use as instructed tid. E11.65 300 each 2  . Insulin Pen Needle  (PEN NEEDLES) 31G X 8 MM MISC Use qhs 100 each 5  . isosorbide mononitrate (IMDUR) 30 MG 24 hr tablet Take 30 mg by mouth daily.    Marland Kitchen losartan-hydrochlorothiazide (HYZAAR) 100-25 MG tablet Take 1 tablet by mouth daily.    . metoprolol succinate (TOPROL-XL) 50 MG 24 hr tablet Take 25 mg by mouth 2 (two) times daily. Take with or immediately following a meal.    . Omega-3 Fatty Acids (FISH OIL) 1200 MG CAPS Take by mouth 2 (two) times daily.     . vitamin C (ASCORBIC ACID) 500 MG tablet Take 500 mg by mouth daily.    [name] 100-3.6 UNIT-MG/ML SOPN INJECT 50 UNITS INTO THE SKIN DAILY WITH BREAKFAST. 15 mL 2   No current facility-administered medications for this visit.      Past Surgical History:  Procedure Laterality Date  . EYE SURGERY       No Known Allergies    Family History  Problem Relation Age of Onset  . Diabetes Other   . Heart disease Other   . Hyperlipidemia Other   . Hypertension Other      Social History Mr. Humbarger reports that he quit smoking about 30 years ago. He has never used smokeless tobacco. Mr. Ulbricht reports that he  does not drink alcohol.   Review of Systems CONSTITUTIONAL: No weight loss, fever, chills, weakness or fatigue.  HEENT: Eyes: No visual loss, blurred vision, double vision or yellow sclerae.No hearing loss, sneezing, congestion, runny nose or sore throat.  SKIN: No rash or itching.  CARDIOVASCULAR: per hpi RESPIRATORY: No shortness of breath, cough or sputum.  GASTROINTESTINAL: No anorexia, nausea, vomiting or diarrhea. No abdominal pain or blood.  GENITOURINARY: No burning on urination, no polyuria NEUROLOGICAL: No headache, dizziness, syncope, paralysis, ataxia, numbness or tingling in the extremities. No change in bowel or bladder control.  MUSCULOSKELETAL: No muscle, back pain, joint pain or stiffness.  LYMPHATICS: No enlarged nodes. No history of splenectomy.  PSYCHIATRIC: No history of depression or anxiety.  ENDOCRINOLOGIC: No  reports of sweating, cold or heat intolerance. No polyuria or polydipsia.  Marland Kitchen   Physical Examination Vitals:   01/27/17 1005  BP: 138/76  Pulse: 74  SpO2: 98%   Vitals:   01/27/17 1005  Weight: 206 lb 3.2 oz (93.5 kg)  Height: 5\' 10"  (1.778 m)    Gen: resting comfortably, no acute distress HEENT: no scleral icterus, pupils equal round and reactive, no palptable cervical adenopathy,  CV: RRR, no m/r/g, no jvd Resp: Clear to auscultation bilaterally GI: abdomen is soft, non-tender, non-distended, normal bowel sounds, no hepatosplenomegaly MSK: extremities are warm, no edema.  Skin: warm, no rash Neuro:  no focal deficits Psych: appropriate affect   Diagnostic Studies 02/2016 echo Study Conclusions  - Left ventricle: The cavity size was normal. Wall thickness was normal. Systolic function was normal. The estimated ejection fraction was in the range of 55% to 60%. Wall motion was normal; there were no regional wall motion abnormalities. Doppler parameters are consistent with abnormal left ventricular relaxation (grade 1 diastolic dysfunction). - Aortic valve: Mildly calcified annulus. Trileaflet; normal thickness leaflets. Valve area (VTI): 2.41 cm^2. Valve area (Vmax): 2.35 cm^2. Valve area (Vmean): 2.05 cm^2. - Mitral valve: Mildly calcified annulus. Normal thickness leaflets . - Technically adequate study.  12/2015 MPI -small focus of ischemia inferior wall, LVEF 50%, low risk.   05/2016 AAA [place] No AAA     Assessment and Plan   1. Chest pain/CAD with stable angina - in setting of low risk nuclear stress, CKD III, and overall controlled symptoms continue medical therapy at this time, no plans for cath.  - we will continue current meds - EKG in clinic SR, no ischemic changes  2. HTN -above goal, we will increase Toprol XL to 37.5mg  bid   3. Hyperlipidemia -lipids are at goal, he will continue statin  4. PAD - noted on recent AAA  [place], he denies any symptoms - continue to monitor  5. DM2 - not at goal, followed by Dr Dorris Fetch  - he is on ASA, statin, ARB from cardiac standpoint      Arnoldo Lenis, M.D.

## 2017-02-02 DIAGNOSIS — Z794 Long term (current) use of insulin: Secondary | ICD-10-CM | POA: Diagnosis not present

## 2017-02-02 DIAGNOSIS — I129 Hypertensive chronic kidney disease with stage 1 through stage 4 chronic kidney disease, or unspecified chronic kidney disease: Secondary | ICD-10-CM | POA: Diagnosis not present

## 2017-02-02 DIAGNOSIS — N183 Chronic kidney disease, stage 3 (moderate): Secondary | ICD-10-CM | POA: Diagnosis not present

## 2017-02-02 DIAGNOSIS — Z7982 Long term (current) use of aspirin: Secondary | ICD-10-CM | POA: Diagnosis not present

## 2017-02-02 DIAGNOSIS — Z79899 Other long term (current) drug therapy: Secondary | ICD-10-CM | POA: Diagnosis not present

## 2017-02-02 DIAGNOSIS — Z87891 Personal history of nicotine dependence: Secondary | ICD-10-CM | POA: Diagnosis not present

## 2017-02-02 DIAGNOSIS — E1122 Type 2 diabetes mellitus with diabetic chronic kidney disease: Secondary | ICD-10-CM | POA: Diagnosis not present

## 2017-02-12 DIAGNOSIS — I25721 Atherosclerosis of autologous artery coronary artery bypass graft(s) with angina pectoris with documented spasm: Secondary | ICD-10-CM | POA: Diagnosis not present

## 2017-02-12 DIAGNOSIS — I10 Essential (primary) hypertension: Secondary | ICD-10-CM | POA: Diagnosis not present

## 2017-02-12 DIAGNOSIS — E1165 Type 2 diabetes mellitus with hyperglycemia: Secondary | ICD-10-CM | POA: Diagnosis not present

## 2017-02-12 DIAGNOSIS — E782 Mixed hyperlipidemia: Secondary | ICD-10-CM | POA: Diagnosis not present

## 2017-02-12 DIAGNOSIS — N183 Chronic kidney disease, stage 3 (moderate): Secondary | ICD-10-CM | POA: Diagnosis not present

## 2017-02-12 DIAGNOSIS — I739 Peripheral vascular disease, unspecified: Secondary | ICD-10-CM | POA: Diagnosis not present

## 2017-02-12 DIAGNOSIS — Z6828 Body mass index (BMI) 28.0-28.9, adult: Secondary | ICD-10-CM | POA: Diagnosis not present

## 2017-03-04 DIAGNOSIS — N189 Chronic kidney disease, unspecified: Secondary | ICD-10-CM | POA: Diagnosis not present

## 2017-03-14 ENCOUNTER — Other Ambulatory Visit: Payer: Self-pay | Admitting: Cardiology

## 2017-03-15 DIAGNOSIS — Z23 Encounter for immunization: Secondary | ICD-10-CM | POA: Diagnosis not present

## 2017-05-04 DIAGNOSIS — E1122 Type 2 diabetes mellitus with diabetic chronic kidney disease: Secondary | ICD-10-CM | POA: Diagnosis not present

## 2017-05-04 DIAGNOSIS — N183 Chronic kidney disease, stage 3 (moderate): Secondary | ICD-10-CM | POA: Diagnosis not present

## 2017-05-04 LAB — HEMOGLOBIN A1C: Hemoglobin A1C: 7.1

## 2017-05-04 LAB — BASIC METABOLIC PANEL
BUN: 25 — AB (ref 4–21)
CREATININE: 1.7 — AB (ref <=1.3)

## 2017-05-04 LAB — LIPID PANEL
Cholesterol: 109 (ref 0–200)
HDL: 31 — AB (ref 35–70)
LDL Cholesterol: 66
Triglycerides: 63 (ref 40–160)

## 2017-05-10 ENCOUNTER — Other Ambulatory Visit: Payer: Self-pay | Admitting: "Endocrinology

## 2017-05-10 DIAGNOSIS — H401121 Primary open-angle glaucoma, left eye, mild stage: Secondary | ICD-10-CM | POA: Diagnosis not present

## 2017-05-11 ENCOUNTER — Ambulatory Visit (INDEPENDENT_AMBULATORY_CARE_PROVIDER_SITE_OTHER): Payer: Medicare Other | Admitting: "Endocrinology

## 2017-05-11 ENCOUNTER — Encounter: Payer: Self-pay | Admitting: "Endocrinology

## 2017-05-11 VITALS — BP 133/71 | HR 70 | Ht 70.0 in | Wt 206.0 lb

## 2017-05-11 DIAGNOSIS — I25118 Atherosclerotic heart disease of native coronary artery with other forms of angina pectoris: Secondary | ICD-10-CM

## 2017-05-11 DIAGNOSIS — E1122 Type 2 diabetes mellitus with diabetic chronic kidney disease: Secondary | ICD-10-CM | POA: Diagnosis not present

## 2017-05-11 DIAGNOSIS — I739 Peripheral vascular disease, unspecified: Secondary | ICD-10-CM | POA: Diagnosis not present

## 2017-05-11 DIAGNOSIS — E782 Mixed hyperlipidemia: Secondary | ICD-10-CM | POA: Diagnosis not present

## 2017-05-11 DIAGNOSIS — N183 Chronic kidney disease, stage 3 unspecified: Secondary | ICD-10-CM

## 2017-05-11 DIAGNOSIS — I10 Essential (primary) hypertension: Secondary | ICD-10-CM

## 2017-05-11 DIAGNOSIS — E1165 Type 2 diabetes mellitus with hyperglycemia: Secondary | ICD-10-CM | POA: Diagnosis not present

## 2017-05-11 NOTE — Patient Instructions (Signed)

## 2017-05-11 NOTE — Progress Notes (Signed)
Subjective:    Patient ID: John Bishop, male    DOB: [birth year]. Patient is here to follow-up for management of diabetes  Complicated by stage 3 renal insufficiency.   Past Medical History:  Diagnosis Date  . Diabetes mellitus, type II ([place])   . Hyperlipidemia   . Hypertension   . Kidney disease    Past Surgical History:  Procedure Laterality Date  . EYE SURGERY     Social History   Socioeconomic History  . Marital status: Married    Spouse name: None  . Number of children: None  . Years of education: None  . Highest education level: None  Social Needs  . Financial resource strain: None  . Food insecurity - worry: None  . Food insecurity - inability: None  . Transportation needs - medical: None  . Transportation needs - non-medical: None  Occupational History  . None  Tobacco Use  . Smoking status: Former Smoker    Last attempt to quit: 06/07/1986    Years since quitting: 30.9  . Smokeless tobacco: Never Used  Substance and Sexual Activity  . Alcohol use: No    Alcohol/week: 0.0 oz  . Drug use: No  . Sexual activity: None  Other Topics Concern  . None  Social History Narrative  . None   Outpatient Encounter Medications as of 05/11/2017  Medication Sig  . amLODipine (NORVASC) 10 MG tablet TAKE 1 TABLET BY MOUTH EVERY DAY  . aspirin 81 MG tablet Take 81 mg by mouth daily.  Marland Kitchen atorvastatin (LIPITOR) 40 MG tablet Take 40 mg by mouth daily.  . Cholecalciferol (VITAMIN D3) 2000 UNITS TABS Take by mouth 2 (two) times daily.   Marland Kitchen glucose blood (CONTOUR NEXT TEST) test strip Use as instructed tid. E11.65  . Insulin Pen Needle (PEN NEEDLES) 31G X 8 MM MISC Use qhs  . isosorbide mononitrate (IMDUR) 30 MG 24 hr tablet Take 30 mg by mouth daily.  Marland Kitchen losartan-hydrochlorothiazide (HYZAAR) 100-25 MG tablet Take 1 tablet by mouth daily.  . metoprolol succinate (TOPROL XL) 25 MG 24 hr tablet Take 1.5 tablets (37.5 mg total) by mouth 2 (two) times daily.  . Omega-3 Fatty  Acids (FISH OIL) 1200 MG CAPS Take by mouth 2 (two) times daily.   . vitamin C (ASCORBIC ACID) 500 MG tablet Take 500 mg by mouth daily.  [name] 100-3.6 UNIT-MG/ML SOPN INJECT 50 UNITS INTO THE SKIN DAILY WITH BREAKFAST.   No facility-administered encounter medications on file as of 05/11/2017.    ALLERGIES: No Known Allergies VACCINATION STATUS:  There is no immunization history on file for this patient.  Diabetes  He presents for his follow-up diabetic visit. He has type 2 diabetes mellitus. Onset time: He was diagnosed at approximate age of 52 years. His disease course has been stable. There are no hypoglycemic associated symptoms. Pertinent negatives for hypoglycemia include no confusion, headaches, pallor or seizures. Pertinent negatives for diabetes include no chest pain, no fatigue, no polydipsia, no polyphagia, no polyuria and no weakness. There are no hypoglycemic complications. Symptoms are stable. Diabetic complications include nephropathy. Pertinent negatives for diabetic complications include no CVA, heart disease or retinopathy. Risk factors for coronary artery disease include diabetes mellitus, dyslipidemia, male sex and tobacco exposure. Current diabetic treatment includes insulin injections and oral agent (dual therapy). He is compliant with treatment most of the time. His weight is stable. He is following a generally healthy diet. He has not had a previous visit with  a dietitian. He participates in exercise intermittently. His breakfast blood glucose range is generally 140-180 mg/dl. His overall blood glucose range is 140-180 mg/dl. An ACE inhibitor/angiotensin II receptor blocker is being taken. Eye exam is current.  Hypertension  This is a chronic problem. The current episode started more than 1 year ago. Pertinent negatives include no chest pain, headaches, neck pain, palpitations or shortness of breath. Risk factors for coronary artery disease include diabetes mellitus,  dyslipidemia, male gender and smoking/tobacco exposure. Past treatments include angiotensin blockers. There is no history of CVA or retinopathy.  Hyperlipidemia  This is a chronic problem. The current episode started more than 1 year ago. Pertinent negatives include no chest pain, myalgias or shortness of breath. Current antihyperlipidemic treatment includes statins. Risk factors for coronary artery disease include dyslipidemia, diabetes mellitus, hypertension and male sex.     Review of Systems  Constitutional: Negative for fatigue and unexpected weight change.  HENT: Negative for dental problem, mouth sores and trouble swallowing.   Eyes: Negative for visual disturbance.  Respiratory: Negative for cough, choking, chest tightness, shortness of breath and wheezing.   Cardiovascular: Negative for chest pain, palpitations and leg swelling.  Gastrointestinal: Negative for abdominal distention, abdominal pain, constipation, diarrhea, nausea and vomiting.  Endocrine: Negative for polydipsia, polyphagia and polyuria.  Genitourinary: Negative for dysuria, flank pain, hematuria and urgency.  Musculoskeletal: Negative for back pain, gait problem, myalgias and neck pain.  Skin: Negative for pallor, rash and wound.  Neurological: Negative for seizures, syncope, weakness, numbness and headaches.  Psychiatric/Behavioral: Negative.  Negative for confusion and dysphoric mood.    Objective:    BP 133/71   Pulse 70   Ht 5\' 10"  (1.778 m)   Wt 206 lb (93.4 kg)   BMI 29.56 kg/m   Wt Readings from Last 3 Encounters:  05/11/17 206 lb (93.4 kg)  01/27/17 206 lb 3.2 oz (93.5 kg)  01/14/17 207 lb (93.9 kg)    Physical Exam  Constitutional: He is oriented to person, place, and time. He appears well-developed and well-nourished. He is cooperative. No distress.  HENT:  Head: Normocephalic and atraumatic.  Eyes: EOM are normal.  Neck: Normal range of motion. Neck supple. No tracheal deviation present. No  thyromegaly present.  Cardiovascular: Normal rate, S1 normal, S2 normal and normal heart sounds. Exam reveals no gallop.  No murmur heard. Pulses:      Dorsalis pedis pulses are 1+ on the right side, and 1+ on the left side.       Posterior tibial pulses are 1+ on the right side, and 1+ on the left side.  Pulmonary/Chest: Breath sounds normal. No respiratory distress. He has no wheezes.  Abdominal: Soft. Bowel sounds are normal. He exhibits no distension. There is no tenderness. There is no guarding and no CVA tenderness.  Musculoskeletal: He exhibits no edema.       Right shoulder: He exhibits no swelling and no deformity.  Neurological: He is alert and oriented to person, place, and time. He has normal strength and normal reflexes. No cranial nerve deficit or sensory deficit. Gait normal.  Skin: Skin is warm and dry. No rash noted. No cyanosis. Nails show no clubbing.  Psychiatric: He has a normal mood and affect. His speech is normal and behavior is normal. Judgment and thought content normal. Cognition and memory are normal.    Diabetic Labs (most recent): Lab Results  Component Value Date   HGBA1C 7.1 05/04/2017   HGBA1C 6.8 01/07/2017  HGBA1C 8.4 10/05/2016    Completed labs from 07/24/2015 to be scanned into his records.  Assessment & Plan:   1. Diabetes mellitus with stage 3 chronic kidney disease ([place])  - Patient has currently controlled asymptomatic type 2 DM since  76 years of age. - He came with controlled fasting blood glucose profile to target and slightly above target postprandial blood glucose readings. His most recent A1c is stable at 7.1%, overall improving from 8.5%. -His average fasting blood glucose profile is near target.   -His diabetes is complicated by stage 3 CKD ( which is now improving with creatinine 1.59 from 1.9 and GFR 49). . - He remains at a high risk for more acute and chronic complications of diabetes which include CAD, CVA, CKD, retinopathy, and  neuropathy. These are all discussed in detail with the patient.  - I have counseled the patient on diet management and weight loss, by adopting a carbohydrate restricted/protein rich diet.  -  Suggestion is made for him to avoid simple carbohydrates  from his diet including Cakes, Sweet Desserts / Pastries, Ice Cream, Soda (diet and regular), Sweet Tea, Candies, Chips, Cookies, Store Bought Juices, Alcohol in Excess of  1-2 drinks a day, Artificial Sweeteners, and "Sugar-free" Products. This will help patient to have stable blood glucose profile and potentially avoid unintended weight gain.  - I encouraged the patient to switch to  unprocessed or minimally processed complex starch and increased protein intake (animal or plant source), fruits, and vegetables.  - Patient is advised to stick to a routine mealtimes to eat 3 meals  a day and avoid unnecessary snacks ( to snack only to correct hypoglycemia).   - I have approached patient with the following individualized plan to manage diabetes and patient agrees:   -  He has benefited from Pen Mar,  I will continue Xultophy 50/1.8 subcutaneously daily with breakfast,  associated with strict monitoring of glucose  daily before breakfast and at bedtime. - This product will help for both diabetes and weight control .  -Patient is encouraged to call clinic for blood glucose levels less than 70 or above 200 mg /dl. -Patient is not a candidate for metformin andSGLT2 inhibitors due to CKD- renal function is improving GFR at 52.  - Patient specific target  A1c;  LDL, HDL, Triglycerides, and  Waist Circumference were discussed in detail.  2) BP/HTN: controlled. I advised him to continue current medications including  ACEI/ARB. He has amlodipine, losartan and metoprolol. 3) Lipids/HPL:  Controlled unknown, continue statins. 4)  Weight/Diet: CDE Consult is  initiated , exercise, and detailed carbohydrates information provided.  5) Chronic Care/Health  Maintenance:  -Patient is on ACEI/ARB and Statin medications and encouraged to continue to follow up with Ophthalmology, nephrology, Podiatrist at least yearly or according to recommendations, and advised to   stay away from smoking. I have recommended yearly flu vaccine and pneumonia vaccination at least every 5 years; moderate intensity exercise for up to 150 minutes weekly; and  sleep for at least 7 hours a day.  - I advised patient to maintain close follow up with PMD  for primary care needs. -- Time spent with the patient: 25 min, of which >50% was spent in reviewing his sugar logs , discussing his hypo- and hyper-glycemic episodes, reviewing his current and  previous labs and insulin doses and developing a plan to avoid hypo- and hyper-glycemia.    Follow up plan: - Return in about 4 months (around 09/09/2017) for follow  up with pre-visit labs, meter, and logs.  Glade Lloyd, MD Phone: [phone]  Fax: [phone]   -  This note was partially dictated with voice recognition software. Similar sounding words can be transcribed inadequately or may not  be corrected upon review.  05/11/2017, 1:18 PM

## 2017-05-11 NOTE — Progress Notes (Signed)
133

## 2017-05-14 DIAGNOSIS — E782 Mixed hyperlipidemia: Secondary | ICD-10-CM | POA: Diagnosis not present

## 2017-05-14 DIAGNOSIS — M1612 Unilateral primary osteoarthritis, left hip: Secondary | ICD-10-CM | POA: Diagnosis not present

## 2017-05-14 DIAGNOSIS — Z6829 Body mass index (BMI) 29.0-29.9, adult: Secondary | ICD-10-CM | POA: Diagnosis not present

## 2017-05-14 DIAGNOSIS — M1611 Unilateral primary osteoarthritis, right hip: Secondary | ICD-10-CM | POA: Diagnosis not present

## 2017-05-14 DIAGNOSIS — E1165 Type 2 diabetes mellitus with hyperglycemia: Secondary | ICD-10-CM | POA: Diagnosis not present

## 2017-05-14 DIAGNOSIS — N183 Chronic kidney disease, stage 3 (moderate): Secondary | ICD-10-CM | POA: Diagnosis not present

## 2017-05-14 DIAGNOSIS — I739 Peripheral vascular disease, unspecified: Secondary | ICD-10-CM | POA: Diagnosis not present

## 2017-05-14 DIAGNOSIS — Z0001 Encounter for general adult medical examination with abnormal findings: Secondary | ICD-10-CM | POA: Diagnosis not present

## 2017-05-14 DIAGNOSIS — I10 Essential (primary) hypertension: Secondary | ICD-10-CM | POA: Diagnosis not present

## 2017-05-14 DIAGNOSIS — G252 Other specified forms of tremor: Secondary | ICD-10-CM | POA: Diagnosis not present

## 2017-05-14 DIAGNOSIS — I25721 Atherosclerosis of autologous artery coronary artery bypass graft(s) with angina pectoris with documented spasm: Secondary | ICD-10-CM | POA: Diagnosis not present

## 2017-05-19 ENCOUNTER — Other Ambulatory Visit: Payer: Self-pay | Admitting: Cardiology

## 2017-07-13 DIAGNOSIS — I10 Essential (primary) hypertension: Secondary | ICD-10-CM | POA: Diagnosis not present

## 2017-07-13 DIAGNOSIS — I739 Peripheral vascular disease, unspecified: Secondary | ICD-10-CM | POA: Diagnosis not present

## 2017-07-13 DIAGNOSIS — M1611 Unilateral primary osteoarthritis, right hip: Secondary | ICD-10-CM | POA: Diagnosis not present

## 2017-07-13 DIAGNOSIS — E1165 Type 2 diabetes mellitus with hyperglycemia: Secondary | ICD-10-CM | POA: Diagnosis not present

## 2017-07-13 DIAGNOSIS — N183 Chronic kidney disease, stage 3 (moderate): Secondary | ICD-10-CM | POA: Diagnosis not present

## 2017-07-13 DIAGNOSIS — Z6829 Body mass index (BMI) 29.0-29.9, adult: Secondary | ICD-10-CM | POA: Diagnosis not present

## 2017-07-13 DIAGNOSIS — G252 Other specified forms of tremor: Secondary | ICD-10-CM | POA: Diagnosis not present

## 2017-07-13 DIAGNOSIS — I25721 Atherosclerosis of autologous artery coronary artery bypass graft(s) with angina pectoris with documented spasm: Secondary | ICD-10-CM | POA: Diagnosis not present

## 2017-07-15 ENCOUNTER — Other Ambulatory Visit: Payer: Self-pay | Admitting: Cardiology

## 2017-07-16 DIAGNOSIS — M25551 Pain in right hip: Secondary | ICD-10-CM | POA: Diagnosis not present

## 2017-07-16 DIAGNOSIS — M1611 Unilateral primary osteoarthritis, right hip: Secondary | ICD-10-CM | POA: Diagnosis not present

## 2017-07-30 DIAGNOSIS — J209 Acute bronchitis, unspecified: Secondary | ICD-10-CM | POA: Diagnosis not present

## 2017-07-30 DIAGNOSIS — Z6830 Body mass index (BMI) 30.0-30.9, adult: Secondary | ICD-10-CM | POA: Diagnosis not present

## 2017-07-30 DIAGNOSIS — R05 Cough: Secondary | ICD-10-CM | POA: Diagnosis not present

## 2017-08-10 ENCOUNTER — Other Ambulatory Visit: Payer: Self-pay | Admitting: "Endocrinology

## 2017-08-18 ENCOUNTER — Encounter: Payer: Self-pay | Admitting: Cardiology

## 2017-08-18 ENCOUNTER — Ambulatory Visit (INDEPENDENT_AMBULATORY_CARE_PROVIDER_SITE_OTHER): Payer: Medicare Other | Admitting: Cardiology

## 2017-08-18 VITALS — BP 136/76 | HR 76 | Ht 70.0 in | Wt 207.0 lb

## 2017-08-18 DIAGNOSIS — I10 Essential (primary) hypertension: Secondary | ICD-10-CM

## 2017-08-18 DIAGNOSIS — I25118 Atherosclerotic heart disease of native coronary artery with other forms of angina pectoris: Secondary | ICD-10-CM

## 2017-08-18 DIAGNOSIS — E782 Mixed hyperlipidemia: Secondary | ICD-10-CM | POA: Diagnosis not present

## 2017-08-18 DIAGNOSIS — I739 Peripheral vascular disease, unspecified: Secondary | ICD-10-CM

## 2017-08-18 MED ORDER — ATORVASTATIN CALCIUM 40 MG PO TABS: 40.0000 mg | ORAL_TABLET | Freq: Every day | ORAL | 1 refills | Status: DC

## 2017-08-18 MED ORDER — METOPROLOL SUCCINATE ER 25 MG PO TB24: 37.5000 mg | ORAL_TABLET | Freq: Two times a day (BID) | ORAL | 1 refills | Status: DC

## 2017-08-18 NOTE — Progress Notes (Signed)
Clinical Summary [name] is a 77 y.o.male  seen today for follow up of the following medical problems.  1. Chest pain - several year history of chest pain - occurs with exertion only. Tightness mid to left chest, 7/10. No other associated symptoms. Comes on 4 laps around track, can keep going 6-9 laps. Then pain resolves. Overall stable in severity and frequency.  - 12/2015 lexiscan showed small area of mild ischemia inferior wall, LVEF 50%. Low risk study.  - 02/2016 echo LVEF 55-60%, no WMAs, grade I diastolic dysfunction CAD risk factors: DM2, hyperlipidemia, HTN, former tobacco, father MI 11s   - no recent chest pain. Exercising regularly. Starting cross fit, does elliptical x10 minutes without troubles - compliant with meds  2. CKD III - followed by pcp.  - upcoming labs in next few weeks with Dr Dorris Fetch. - followed by [facility] nephrology.   - has f/u next month  3. Hyperlipidemia - 04/2017 TG 63 TC 109 HDL 31 LDL 66 - compliant with statin  4. HTN - no ACE-I due to poor renal funciton - home bp's 140s/70s. This is before taking meds.   5. DM2 - followed by endocrinology Dr Dorris Fetch   6. PAD - history of tobacco abuse - no evidence of aneurysm by 05/2016 [place], did show some PAD. He denies claudication.  Past Medical History:  Diagnosis Date  . Diabetes mellitus, type II ([place])   . Hyperlipidemia   . Hypertension   . Kidney disease      No Known Allergies   Current Outpatient Medications  Medication Sig Dispense Refill  . amLODipine (NORVASC) 10 MG tablet TAKE 1 TABLET BY MOUTH EVERY DAY 90 tablet 1  . aspirin 81 MG tablet Take 81 mg by mouth daily.    Marland Kitchen atorvastatin (LIPITOR) 40 MG tablet Take 40 mg by mouth daily.    Marland Kitchen atorvastatin (LIPITOR) 40 MG tablet TAKE 1 TABLET BY MOUTH EVERY DAY 90 tablet 1  . Cholecalciferol (VITAMIN D3) 2000 UNITS TABS Take by mouth 2 (two) times daily.     . CONTOUR NEXT TEST test strip USE AS DIRECTED THREE TIMES A DAY.  300 each 1  . Insulin Pen Needle (PEN NEEDLES) 31G X 8 MM MISC Use qhs 100 each 5  . isosorbide mononitrate (IMDUR) 30 MG 24 hr tablet Take 30 mg by mouth daily.    . isosorbide mononitrate (IMDUR) 30 MG 24 hr tablet TAKE 1 TABLET (30 MG TOTAL) BY MOUTH DAILY. 90 tablet 3  . losartan-hydrochlorothiazide (HYZAAR) 100-25 MG tablet Take 1 tablet by mouth daily.    . metoprolol succinate (TOPROL XL) 25 MG 24 hr tablet Take 1.5 tablets (37.5 mg total) by mouth 2 (two) times daily. 90 tablet 6  . Omega-3 Fatty Acids (FISH OIL) 1200 MG CAPS Take by mouth 2 (two) times daily.     . vitamin C (ASCORBIC ACID) 500 MG tablet Take 500 mg by mouth daily.    [name] 100-3.6 UNIT-MG/ML SOPN INJECT 50 UNITS INTO THE SKIN DAILY WITH BREAKFAST. 15 mL 0   No current facility-administered medications for this visit.      Past Surgical History:  Procedure Laterality Date  . EYE SURGERY       No Known Allergies    Family History  Problem Relation Age of Onset  . Diabetes Other   . Heart disease Other   . Hyperlipidemia Other   . Hypertension Other      Social  History John Bishop reports that he quit smoking about 31 years ago. he has never used smokeless tobacco. John Bishop reports that he does not drink alcohol.   Review of Systems CONSTITUTIONAL: No weight loss, fever, chills, weakness or fatigue.  HEENT: Eyes: No visual loss, blurred vision, double vision or yellow sclerae.No hearing loss, sneezing, congestion, runny nose or sore throat.  SKIN: No rash or itching.  CARDIOVASCULAR: per hpi RESPIRATORY: No shortness of breath, cough or sputum.  GASTROINTESTINAL: No anorexia, nausea, vomiting or diarrhea. No abdominal pain or blood.  GENITOURINARY: No burning on urination, no polyuria NEUROLOGICAL: No headache, dizziness, syncope, paralysis, ataxia, numbness or tingling in the extremities. No change in bowel or bladder control.  MUSCULOSKELETAL: No muscle, back pain, joint pain or stiffness.    LYMPHATICS: No enlarged nodes. No history of splenectomy.  PSYCHIATRIC: No history of depression or anxiety.  ENDOCRINOLOGIC: No reports of sweating, cold or heat intolerance. No polyuria or polydipsia.  Marland Kitchen   Physical Examination Vitals:   08/18/17 0944  BP: 136/76  Pulse: 76   Vitals:   08/18/17 0944  Weight: 207 lb (93.9 kg)  Height: 5\' 10"  (1.778 m)    Gen: resting comfortably, no acute distress HEENT: no scleral icterus, pupils equal round and reactive, no palptable cervical adenopathy,  CV: RRR, no m/r/g, no jvd Resp: Clear to auscultation bilaterally GI: abdomen is soft, non-tender, non-distended, normal bowel sounds, no hepatosplenomegaly MSK: extremities are warm, no edema.  Skin: warm, no rash Neuro:  no focal deficits Psych: appropriate affect   Diagnostic Studies 02/2016 echo Study Conclusions  - Left ventricle: The cavity size was normal. Wall thickness was normal. Systolic function was normal. The estimated ejection fraction was in the range of 55% to 60%. Wall motion was normal; there were no regional wall motion abnormalities. Doppler parameters are consistent with abnormal left ventricular relaxation (grade 1 diastolic dysfunction). - Aortic valve: Mildly calcified annulus. Trileaflet; normal thickness leaflets. Valve area (VTI): 2.41 cm^2. Valve area (Vmax): 2.35 cm^2. Valve area (Vmean): 2.05 cm^2. - Mitral valve: Mildly calcified annulus. Normal thickness leaflets . - Technically adequate study.  12/2015 MPI -small focus of ischemia inferior wall, LVEF 50%, low risk.   05/2016 AAA [place] No AAA     Assessment and Plan  1. Chest pain/CAD with stable angina - in setting of low risk nuclear stress, CKD III, and overall controlled symptoms continue medical therapy at this time, no plans for cath.  - no symptoms, continue current meds  2. HTN - reasonable control, continue current meds   3. Hyperlipidemia -has been at  goal, continue current meds  4. PAD - noted on recent AAA [place]. No claudication - continue risk factor modification.     F/U 6 MONTHS   Arnoldo Lenis, M.D.,

## 2017-08-18 NOTE — Patient Instructions (Signed)

## 2017-08-19 DIAGNOSIS — E782 Mixed hyperlipidemia: Secondary | ICD-10-CM | POA: Diagnosis not present

## 2017-08-19 DIAGNOSIS — E1165 Type 2 diabetes mellitus with hyperglycemia: Secondary | ICD-10-CM | POA: Diagnosis not present

## 2017-08-19 DIAGNOSIS — I739 Peripheral vascular disease, unspecified: Secondary | ICD-10-CM | POA: Diagnosis not present

## 2017-08-19 DIAGNOSIS — N183 Chronic kidney disease, stage 3 (moderate): Secondary | ICD-10-CM | POA: Diagnosis not present

## 2017-08-19 DIAGNOSIS — I10 Essential (primary) hypertension: Secondary | ICD-10-CM | POA: Diagnosis not present

## 2017-08-22 ENCOUNTER — Encounter: Payer: Self-pay | Admitting: Cardiology

## 2017-08-23 DIAGNOSIS — E1165 Type 2 diabetes mellitus with hyperglycemia: Secondary | ICD-10-CM | POA: Diagnosis not present

## 2017-08-23 DIAGNOSIS — Z1389 Encounter for screening for other disorder: Secondary | ICD-10-CM | POA: Diagnosis not present

## 2017-08-23 DIAGNOSIS — I739 Peripheral vascular disease, unspecified: Secondary | ICD-10-CM | POA: Diagnosis not present

## 2017-08-23 DIAGNOSIS — Z6829 Body mass index (BMI) 29.0-29.9, adult: Secondary | ICD-10-CM | POA: Diagnosis not present

## 2017-08-23 DIAGNOSIS — I10 Essential (primary) hypertension: Secondary | ICD-10-CM | POA: Diagnosis not present

## 2017-08-23 DIAGNOSIS — E782 Mixed hyperlipidemia: Secondary | ICD-10-CM | POA: Diagnosis not present

## 2017-08-23 DIAGNOSIS — G252 Other specified forms of tremor: Secondary | ICD-10-CM | POA: Diagnosis not present

## 2017-08-23 DIAGNOSIS — N183 Chronic kidney disease, stage 3 (moderate): Secondary | ICD-10-CM | POA: Diagnosis not present

## 2017-08-26 DIAGNOSIS — M25551 Pain in right hip: Secondary | ICD-10-CM | POA: Diagnosis not present

## 2017-08-26 DIAGNOSIS — M1611 Unilateral primary osteoarthritis, right hip: Secondary | ICD-10-CM | POA: Diagnosis not present

## 2017-08-26 DIAGNOSIS — R262 Difficulty in walking, not elsewhere classified: Secondary | ICD-10-CM | POA: Diagnosis not present

## 2017-08-31 DIAGNOSIS — M25551 Pain in right hip: Secondary | ICD-10-CM | POA: Diagnosis not present

## 2017-08-31 DIAGNOSIS — M1611 Unilateral primary osteoarthritis, right hip: Secondary | ICD-10-CM | POA: Diagnosis not present

## 2017-08-31 DIAGNOSIS — R262 Difficulty in walking, not elsewhere classified: Secondary | ICD-10-CM | POA: Diagnosis not present

## 2017-09-02 ENCOUNTER — Encounter: Payer: Self-pay | Admitting: "Endocrinology

## 2017-09-02 DIAGNOSIS — R262 Difficulty in walking, not elsewhere classified: Secondary | ICD-10-CM | POA: Diagnosis not present

## 2017-09-02 DIAGNOSIS — N183 Chronic kidney disease, stage 3 (moderate): Secondary | ICD-10-CM | POA: Diagnosis not present

## 2017-09-02 DIAGNOSIS — M25551 Pain in right hip: Secondary | ICD-10-CM | POA: Diagnosis not present

## 2017-09-02 DIAGNOSIS — M1611 Unilateral primary osteoarthritis, right hip: Secondary | ICD-10-CM | POA: Diagnosis not present

## 2017-09-02 DIAGNOSIS — E1122 Type 2 diabetes mellitus with diabetic chronic kidney disease: Secondary | ICD-10-CM | POA: Diagnosis not present

## 2017-09-02 LAB — HEMOGLOBIN A1C: Hemoglobin A1C: 7.1

## 2017-09-03 ENCOUNTER — Other Ambulatory Visit: Payer: Self-pay

## 2017-09-03 LAB — BASIC METABOLIC PANEL
BUN: 30 — AB (ref 4–21)
Creatinine: 1.8 — AB (ref <=1.3)

## 2017-09-05 ENCOUNTER — Other Ambulatory Visit: Payer: Self-pay | Admitting: Cardiology

## 2017-09-07 DIAGNOSIS — M25551 Pain in right hip: Secondary | ICD-10-CM | POA: Diagnosis not present

## 2017-09-07 DIAGNOSIS — M1611 Unilateral primary osteoarthritis, right hip: Secondary | ICD-10-CM | POA: Diagnosis not present

## 2017-09-07 DIAGNOSIS — R262 Difficulty in walking, not elsewhere classified: Secondary | ICD-10-CM | POA: Diagnosis not present

## 2017-09-09 ENCOUNTER — Encounter: Payer: Self-pay | Admitting: "Endocrinology

## 2017-09-09 ENCOUNTER — Ambulatory Visit (INDEPENDENT_AMBULATORY_CARE_PROVIDER_SITE_OTHER): Payer: Medicare Other | Admitting: "Endocrinology

## 2017-09-09 VITALS — BP 130/87 | HR 77 | Ht 70.0 in | Wt 207.0 lb

## 2017-09-09 DIAGNOSIS — N183 Chronic kidney disease, stage 3 (moderate): Secondary | ICD-10-CM | POA: Diagnosis not present

## 2017-09-09 DIAGNOSIS — E1122 Type 2 diabetes mellitus with diabetic chronic kidney disease: Secondary | ICD-10-CM

## 2017-09-09 DIAGNOSIS — E782 Mixed hyperlipidemia: Secondary | ICD-10-CM | POA: Diagnosis not present

## 2017-09-09 DIAGNOSIS — I25118 Atherosclerotic heart disease of native coronary artery with other forms of angina pectoris: Secondary | ICD-10-CM

## 2017-09-09 DIAGNOSIS — M25551 Pain in right hip: Secondary | ICD-10-CM | POA: Diagnosis not present

## 2017-09-09 DIAGNOSIS — M1611 Unilateral primary osteoarthritis, right hip: Secondary | ICD-10-CM | POA: Diagnosis not present

## 2017-09-09 DIAGNOSIS — R262 Difficulty in walking, not elsewhere classified: Secondary | ICD-10-CM | POA: Diagnosis not present

## 2017-09-09 DIAGNOSIS — I10 Essential (primary) hypertension: Secondary | ICD-10-CM

## 2017-09-09 NOTE — Progress Notes (Signed)
Subjective:    Patient ID: John Bishop, male    DOB: 1940/08/02. Patient is here to follow-up for management of diabetes  Complicated by stage 3 renal insufficiency.   Past Medical History:  Diagnosis Date  . Diabetes mellitus, type II ([place])   . Hyperlipidemia   . Hypertension   . Kidney disease    Past Surgical History:  Procedure Laterality Date  . EYE SURGERY     Social History   Socioeconomic History  . Marital status: Married    Spouse name: Not on file  . Number of children: Not on file  . Years of education: Not on file  . Highest education level: Not on file  Occupational History  . Not on file  Social Needs  . Financial resource strain: Not on file  . Food insecurity:    Worry: Not on file    Inability: Not on file  . Transportation needs:    Medical: Not on file    Non-medical: Not on file  Tobacco Use  . Smoking status: Former Smoker    Last attempt to quit: 06/07/1986    Years since quitting: 31.2  . Smokeless tobacco: Never Used  Substance and Sexual Activity  . Alcohol use: No    Alcohol/week: 0.0 oz  . Drug use: No  . Sexual activity: Not on file  Lifestyle  . Physical activity:    Days per week: Not on file    Minutes per session: Not on file  . Stress: Not on file  Relationships  . Social connections:    Talks on phone: Not on file    Gets together: Not on file    Attends religious service: Not on file    Active member of club or organization: Not on file    Attends meetings of clubs or organizations: Not on file    Relationship status: Not on file  Other Topics Concern  . Not on file  Social History Narrative  . Not on file   Outpatient Encounter Medications as of 09/09/2017  Medication Sig  . amLODipine (NORVASC) 10 MG tablet TAKE 1 TABLET BY MOUTH EVERY DAY  . aspirin 81 MG tablet Take 81 mg by mouth daily.  Marland Kitchen atorvastatin (LIPITOR) 40 MG tablet Take 1 tablet (40 mg total) by mouth daily.  . Cholecalciferol (VITAMIN D3) 2000  UNITS TABS Take by mouth 2 (two) times daily.   . CONTOUR NEXT TEST test strip USE AS DIRECTED THREE TIMES A DAY.  Marland Kitchen Insulin Pen Needle (PEN NEEDLES) 31G X 8 MM MISC Use qhs  . isosorbide mononitrate (IMDUR) 30 MG 24 hr tablet TAKE 1 TABLET (30 MG TOTAL) BY MOUTH DAILY.  Marland Kitchen latanoprost (XALATAN) 0.005 % ophthalmic solution Place 1 drop into the left eye at bedtime.  Marland Kitchen losartan-hydrochlorothiazide (HYZAAR) 100-25 MG tablet Take 1 tablet by mouth daily.  . metoprolol succinate (TOPROL XL) 25 MG 24 hr tablet Take 1.5 tablets (37.5 mg total) by mouth 2 (two) times daily.  . Omega-3 Fatty Acids (FISH OIL) 1200 MG CAPS Take by mouth 2 (two) times daily.   . primidone (MYSOLINE) 50 MG tablet Take 1 tablet by mouth at bedtime.  [name] 100-3.6 UNIT-MG/ML SOPN INJECT 50 UNITS INTO THE SKIN DAILY WITH BREAKFAST.   No facility-administered encounter medications on file as of 09/09/2017.    ALLERGIES: No Known Allergies VACCINATION STATUS:  There is no immunization history on file for this patient.  Diabetes  He presents  for his follow-up diabetic visit. He has type 2 diabetes mellitus. Onset time: He was diagnosed at approximate age of 64 years. His disease course has been stable. There are no hypoglycemic associated symptoms. Pertinent negatives for hypoglycemia include no confusion, headaches, pallor or seizures. Pertinent negatives for diabetes include no chest pain, no fatigue, no polydipsia, no polyphagia, no polyuria and no weakness. There are no hypoglycemic complications. Symptoms are stable. Diabetic complications include nephropathy. Pertinent negatives for diabetic complications include no CVA, heart disease or retinopathy. Risk factors for coronary artery disease include diabetes mellitus, dyslipidemia, male sex and tobacco exposure. Current diabetic treatment includes insulin injections and oral agent (dual therapy). He is compliant with treatment most of the time. His weight is stable. He is  following a generally healthy diet. He has not had a previous visit with a dietitian. He participates in exercise intermittently. His home blood glucose trend is decreasing steadily. His breakfast blood glucose range is generally 140-180 mg/dl. His overall blood glucose range is 140-180 mg/dl. An ACE inhibitor/angiotensin II receptor blocker is being taken. Eye exam is current (No retinopathy February 2019).  Hypertension  This is a chronic problem. The current episode started more than 1 year ago. Pertinent negatives include no chest pain, headaches, neck pain, palpitations or shortness of breath. Risk factors for coronary artery disease include diabetes mellitus, dyslipidemia, male gender and smoking/tobacco exposure. Past treatments include angiotensin blockers. There is no history of CVA or retinopathy.  Hyperlipidemia  This is a chronic problem. The current episode started more than 1 year ago. The problem is controlled. Exacerbating diseases include diabetes. Pertinent negatives include no chest pain, myalgias or shortness of breath. Current antihyperlipidemic treatment includes statins. Risk factors for coronary artery disease include dyslipidemia, diabetes mellitus, hypertension and male sex.     Review of Systems  Constitutional: Negative for fatigue and unexpected weight change.  HENT: Negative for dental problem, mouth sores and trouble swallowing.   Eyes: Negative for visual disturbance.  Respiratory: Negative for cough, choking, chest tightness, shortness of breath and wheezing.   Cardiovascular: Negative for chest pain, palpitations and leg swelling.  Gastrointestinal: Negative for abdominal distention, abdominal pain, constipation, diarrhea, nausea and vomiting.  Endocrine: Negative for polydipsia, polyphagia and polyuria.  Genitourinary: Negative for dysuria, flank pain, hematuria and urgency.  Musculoskeletal: Negative for back pain, gait problem, myalgias and neck pain.  Skin:  Negative for pallor, rash and wound.  Neurological: Negative for seizures, syncope, weakness, numbness and headaches.  Psychiatric/Behavioral: Negative.  Negative for confusion and dysphoric mood.    Objective:    BP 130/87   Pulse 77   Ht 5\' 10"  (1.778 m)   Wt 207 lb (93.9 kg)   BMI 29.70 kg/m   Wt Readings from Last 3 Encounters:  09/09/17 207 lb (93.9 kg)  08/18/17 207 lb (93.9 kg)  05/11/17 206 lb (93.4 kg)    Physical Exam  Constitutional: He is oriented to person, place, and time. He appears well-developed. He is cooperative. No distress.  HENT:  Head: Normocephalic and atraumatic.  Eyes: EOM are normal.  Neck: Normal range of motion. Neck supple. No tracheal deviation present. No thyromegaly present.  Cardiovascular: Normal rate, S1 normal and S2 normal. Exam reveals no gallop.  No murmur heard. Pulses:      Dorsalis pedis pulses are 1+ on the right side, and 1+ on the left side.       Posterior tibial pulses are 1+ on the right side, and 1+ on  the left side.  Pulmonary/Chest: Effort normal. No respiratory distress. He has no wheezes.  Abdominal: He exhibits no distension. There is no tenderness. There is no guarding and no CVA tenderness.  Musculoskeletal: He exhibits no edema.       Right shoulder: He exhibits no swelling and no deformity.  Neurological: He is alert and oriented to person, place, and time. He has normal strength and normal reflexes. No cranial nerve deficit or sensory deficit. Gait normal.  Skin: Skin is warm and dry. No rash noted. No cyanosis. Nails show no clubbing.  Psychiatric: He has a normal mood and affect. His speech is normal and behavior is normal. Judgment normal. Cognition and memory are normal.   CMP     Component Value Date/Time   BUN 30 (A) 09/03/2017   CREATININE 1.8 (A) 09/03/2017   Lipid Panel     Component Value Date/Time   CHOL 109 05/04/2017   TRIG 63 05/04/2017   HDL 31 (A) 05/04/2017   LDLCALC 66 05/04/2017    Diabetic Labs (most recent): Lab Results  Component Value Date   HGBA1C 7.1 09/03/2017   HGBA1C 7.1 05/04/2017   HGBA1C 6.8 01/07/2017    Completed labs from 07/24/2015 to be scanned into his records.  Assessment & Plan:   1. Diabetes mellitus with stage 3 chronic kidney disease ([place])  - Patient has currently controlled asymptomatic type 2 DM since  77 years of age. - He came with controlled fasting blood glucose profile to target and slightly above target postprandial blood glucose readings. His most recent A1c is stable at 7.1%, overall improving from 8.5%.   -His average fasting blood glucose profile is near target.   -His diabetes is complicated by stage 3 CKD ( which is stable GFR between 44-52).   - He remains at a high risk for more acute and chronic complications of diabetes which include CAD, CVA, CKD, retinopathy, and neuropathy. These are all discussed in detail with the patient.  - I have counseled the patient on diet management and weight loss, by adopting a carbohydrate restricted/protein rich diet.  -  Suggestion is made for him to avoid simple carbohydrates  from his diet including Cakes, Sweet Desserts / Pastries, Ice Cream, Soda (diet and regular), Sweet Tea, Candies, Chips, Cookies, Store Bought Juices, Alcohol in Excess of  1-2 drinks a day, Artificial Sweeteners, and "Sugar-free" Products. This will help patient to have stable blood glucose profile and potentially avoid unintended weight gain.   - I encouraged the patient to switch to  unprocessed or minimally processed complex starch and increased protein intake (animal or plant source), fruits, and vegetables.  - Patient is advised to stick to a routine mealtimes to eat 3 meals  a day and avoid unnecessary snacks ( to snack only to correct hypoglycemia).   - I have approached patient with the following individualized plan to manage diabetes and patient agrees:   -  He has benefited from [place],  I will  continue Xultophy 50/1.8 subcutaneously daily with breakfast,  associated with strict monitoring of glucose  daily before breakfast and at bedtime. - This product will help for both diabetes and weight control.  -Patient is encouraged to call clinic for blood glucose levels less than 70 or above 200 mg /dl. -Patient is not a candidate for metformin, SGLT2 inhibitors due to CKD- renal function is improving GFR at 52.  - Patient specific target  A1c;  LDL, HDL, Triglycerides, and  Waist Circumference  were discussed in detail.  2) BP/HTN: His blood pressure is controlled to target.  I advised him to continue current medications including  ACEI/ARB. He has amlodipine, losartan and metoprolol.  3) Lipids/HPL:  Controlled with LDL at 66, continue statins.  He will have fasting lipid panel before his next visit.    4)  Weight/Diet: CDE Consult is  initiated , exercise, and detailed carbohydrates information provided.  5) Chronic Care/Health Maintenance:  -Patient is on ACEI/ARB and Statin medications and encouraged to continue to follow up with Ophthalmology, nephrology, Podiatrist at least yearly or according to recommendations, and advised to   stay away from smoking. I have recommended yearly flu vaccine and pneumonia vaccination at least every 5 years; moderate intensity exercise for up to 150 minutes weekly; and  sleep for at least 7 hours a day.  - I advised patient to maintain close follow up with PMD  for primary care needs. - Time spent with the patient: 25 min, of which >50% was spent in reviewing his blood glucose logs , discussing his hypo- and hyper-glycemic episodes, reviewing his current and  previous labs and insulin doses and developing a plan to avoid hypo- and hyper-glycemia. Please refer to Patient Instructions for Blood Glucose Monitoring and Insulin/Medications Dosing Guide"  in media tab for additional information. John Bishop participated in the discussions, expressed  understanding, and voiced agreement with the above plans.  All questions were answered to his satisfaction. he is encouraged to contact clinic should he have any questions or concerns prior to his return visit.  Follow up plan: - Return in about 6 months (around 03/11/2018) for follow up with pre-visit labs, meter, and logs.  Glade Lloyd, MD Phone: [phone]  Fax: [phone]   -  This note was partially dictated with voice recognition software. Similar sounding words can be transcribed inadequately or may not  be corrected upon review.  09/09/2017, 10:23 AM

## 2017-09-09 NOTE — Patient Instructions (Signed)

## 2017-09-10 ENCOUNTER — Other Ambulatory Visit: Payer: Self-pay | Admitting: "Endocrinology

## 2017-09-13 DIAGNOSIS — R262 Difficulty in walking, not elsewhere classified: Secondary | ICD-10-CM | POA: Diagnosis not present

## 2017-09-13 DIAGNOSIS — M25551 Pain in right hip: Secondary | ICD-10-CM | POA: Diagnosis not present

## 2017-09-13 DIAGNOSIS — M1611 Unilateral primary osteoarthritis, right hip: Secondary | ICD-10-CM | POA: Diagnosis not present

## 2017-09-14 DIAGNOSIS — E1121 Type 2 diabetes mellitus with diabetic nephropathy: Secondary | ICD-10-CM | POA: Diagnosis not present

## 2017-09-14 DIAGNOSIS — Z7982 Long term (current) use of aspirin: Secondary | ICD-10-CM | POA: Diagnosis not present

## 2017-09-14 DIAGNOSIS — I129 Hypertensive chronic kidney disease with stage 1 through stage 4 chronic kidney disease, or unspecified chronic kidney disease: Secondary | ICD-10-CM | POA: Diagnosis not present

## 2017-09-14 DIAGNOSIS — N183 Chronic kidney disease, stage 3 (moderate): Secondary | ICD-10-CM | POA: Diagnosis not present

## 2017-09-14 DIAGNOSIS — Z79899 Other long term (current) drug therapy: Secondary | ICD-10-CM | POA: Diagnosis not present

## 2017-09-14 DIAGNOSIS — E1122 Type 2 diabetes mellitus with diabetic chronic kidney disease: Secondary | ICD-10-CM | POA: Diagnosis not present

## 2017-09-14 DIAGNOSIS — D631 Anemia in chronic kidney disease: Secondary | ICD-10-CM | POA: Diagnosis not present

## 2017-09-14 DIAGNOSIS — Z794 Long term (current) use of insulin: Secondary | ICD-10-CM | POA: Diagnosis not present

## 2017-09-16 DIAGNOSIS — R262 Difficulty in walking, not elsewhere classified: Secondary | ICD-10-CM | POA: Diagnosis not present

## 2017-09-16 DIAGNOSIS — M25551 Pain in right hip: Secondary | ICD-10-CM | POA: Diagnosis not present

## 2017-09-16 DIAGNOSIS — M1611 Unilateral primary osteoarthritis, right hip: Secondary | ICD-10-CM | POA: Diagnosis not present

## 2017-09-20 DIAGNOSIS — M1611 Unilateral primary osteoarthritis, right hip: Secondary | ICD-10-CM | POA: Diagnosis not present

## 2017-09-20 DIAGNOSIS — M25551 Pain in right hip: Secondary | ICD-10-CM | POA: Diagnosis not present

## 2017-09-20 DIAGNOSIS — R262 Difficulty in walking, not elsewhere classified: Secondary | ICD-10-CM | POA: Diagnosis not present

## 2017-09-23 DIAGNOSIS — M25551 Pain in right hip: Secondary | ICD-10-CM | POA: Diagnosis not present

## 2017-09-23 DIAGNOSIS — M1611 Unilateral primary osteoarthritis, right hip: Secondary | ICD-10-CM | POA: Diagnosis not present

## 2017-09-23 DIAGNOSIS — R262 Difficulty in walking, not elsewhere classified: Secondary | ICD-10-CM | POA: Diagnosis not present

## 2017-09-27 DIAGNOSIS — R262 Difficulty in walking, not elsewhere classified: Secondary | ICD-10-CM | POA: Diagnosis not present

## 2017-09-27 DIAGNOSIS — M25551 Pain in right hip: Secondary | ICD-10-CM | POA: Diagnosis not present

## 2017-09-27 DIAGNOSIS — M1611 Unilateral primary osteoarthritis, right hip: Secondary | ICD-10-CM | POA: Diagnosis not present

## 2017-09-30 DIAGNOSIS — M25551 Pain in right hip: Secondary | ICD-10-CM | POA: Diagnosis not present

## 2017-09-30 DIAGNOSIS — R262 Difficulty in walking, not elsewhere classified: Secondary | ICD-10-CM | POA: Diagnosis not present

## 2017-09-30 DIAGNOSIS — M1611 Unilateral primary osteoarthritis, right hip: Secondary | ICD-10-CM | POA: Diagnosis not present

## 2017-10-05 DIAGNOSIS — M25551 Pain in right hip: Secondary | ICD-10-CM | POA: Diagnosis not present

## 2017-10-05 DIAGNOSIS — N183 Chronic kidney disease, stage 3 (moderate): Secondary | ICD-10-CM | POA: Diagnosis not present

## 2017-10-05 DIAGNOSIS — M1611 Unilateral primary osteoarthritis, right hip: Secondary | ICD-10-CM | POA: Diagnosis not present

## 2017-10-05 DIAGNOSIS — I25721 Atherosclerosis of autologous artery coronary artery bypass graft(s) with angina pectoris with documented spasm: Secondary | ICD-10-CM | POA: Diagnosis not present

## 2017-10-05 DIAGNOSIS — Z6829 Body mass index (BMI) 29.0-29.9, adult: Secondary | ICD-10-CM | POA: Diagnosis not present

## 2017-10-05 DIAGNOSIS — I10 Essential (primary) hypertension: Secondary | ICD-10-CM | POA: Diagnosis not present

## 2017-10-05 DIAGNOSIS — M1612 Unilateral primary osteoarthritis, left hip: Secondary | ICD-10-CM | POA: Diagnosis not present

## 2017-10-05 DIAGNOSIS — R262 Difficulty in walking, not elsewhere classified: Secondary | ICD-10-CM | POA: Diagnosis not present

## 2017-10-05 DIAGNOSIS — E782 Mixed hyperlipidemia: Secondary | ICD-10-CM | POA: Diagnosis not present

## 2017-10-05 DIAGNOSIS — E1165 Type 2 diabetes mellitus with hyperglycemia: Secondary | ICD-10-CM | POA: Diagnosis not present

## 2017-10-22 ENCOUNTER — Encounter: Payer: Self-pay | Admitting: Diagnostic Neuroimaging

## 2017-10-22 ENCOUNTER — Ambulatory Visit (INDEPENDENT_AMBULATORY_CARE_PROVIDER_SITE_OTHER): Payer: Medicare Other | Admitting: Diagnostic Neuroimaging

## 2017-10-22 VITALS — BP 141/77 | HR 74 | Ht 70.0 in | Wt 209.0 lb

## 2017-10-22 DIAGNOSIS — R253 Fasciculation: Secondary | ICD-10-CM | POA: Diagnosis not present

## 2017-10-22 DIAGNOSIS — R6889 Other general symptoms and signs: Secondary | ICD-10-CM

## 2017-10-22 DIAGNOSIS — M6281 Muscle weakness (generalized): Secondary | ICD-10-CM

## 2017-10-22 DIAGNOSIS — R251 Tremor, unspecified: Secondary | ICD-10-CM

## 2017-10-22 DIAGNOSIS — I25118 Atherosclerotic heart disease of native coronary artery with other forms of angina pectoris: Secondary | ICD-10-CM

## 2017-10-22 DIAGNOSIS — Z79899 Other long term (current) drug therapy: Secondary | ICD-10-CM | POA: Diagnosis not present

## 2017-10-22 NOTE — Progress Notes (Signed)
[facility]  PATIENT: John Bishop DOB: 05/22/1941  REFERRING CLINICIAN: S Burdine HISTORY FROM: patient  REASON FOR VISIT: new consult    HISTORICAL  CHIEF COMPLAINT:  Chief Complaint  Patient presents with  . NP Dr. Judd Lien  . Tremors/ Weakness  R arm> L    Rm 7, wife [name],  Has had sx R hand 2 yrs > L. On Primidone, no change.    HISTORY OF PRESENT ILLNESS:   77 year old male here for evaluation of tremor.  History of hypertension, diabetes, hypercholesterolemia, chronic renal disease.  2 years ago patient noticed onset of mild tremor in his right hand fingertips.  This progressively worsened over time.  He was diagnosed with possible essential tremor and tried on primidone.  Symptoms did not improve.  November 2018 patient noticed some weakness in his right arm.  Patient having more trouble lifting objects, buttoning his shirts, having fine movements.  Patient has noticed some muscle twitching in bilateral arms.  Now noticing some tremor in his left hand.  Symptoms seem to be worse after he has exerted himself.  If he rests symptoms seem to improve.  No problems with his legs, balance, walking.  No speech or swallowing difficulty.  No double vision or drooping eyelids.  No neck pain or shoulder pain.   REVIEW OF SYSTEMS: Full 14 system review of systems performed and negative with exception of: Swelling leg snoring numbness weakness tremor pain cramps.  ALLERGIES: No Known Allergies  HOME MEDICATIONS: Outpatient Medications Prior to Visit  Medication Sig Dispense Refill  . amLODipine (NORVASC) 10 MG tablet TAKE 1 TABLET BY MOUTH EVERY DAY 90 tablet 1  . aspirin 81 MG tablet Take 81 mg by mouth daily.    Marland Kitchen atorvastatin (LIPITOR) 40 MG tablet Take 1 tablet (40 mg total) by mouth daily. 90 tablet 1  . Cholecalciferol (VITAMIN D3) 2000 UNITS TABS Take by mouth 2 (two) times daily.     . CONTOUR NEXT TEST test strip USE AS DIRECTED THREE  TIMES A DAY. 300 each 1  . Insulin Pen Needle (PEN NEEDLES) 31G X 8 MM MISC Use qhs 100 each 5  . latanoprost (XALATAN) 0.005 % ophthalmic solution Place 1 drop into the left eye at bedtime.    Marland Kitchen losartan-hydrochlorothiazide (HYZAAR) 100-25 MG tablet Take 1 tablet by mouth daily.    . metoprolol succinate (TOPROL XL) 25 MG 24 hr tablet Take 1.5 tablets (37.5 mg total) by mouth 2 (two) times daily. 270 tablet 1  . Omega-3 Fatty Acids (FISH OIL) 1200 MG CAPS Take by mouth 2 (two) times daily.     . primidone (MYSOLINE) 50 MG tablet Take 1 tablet by mouth at bedtime.  3  . XULTOPHY 100-3.6 UNIT-MG/ML SOPN INJECT 50 UNITS INTO THE SKIN DAILY WITH BREAKFAST. 15 mL 2  . isosorbide mononitrate (IMDUR) 30 MG 24 hr tablet TAKE 1 TABLET (30 MG TOTAL) BY MOUTH DAILY. 90 tablet 3   No facility-administered medications prior to visit.     PAST MEDICAL HISTORY: Past Medical History:  Diagnosis Date  . Diabetes mellitus, type II ([place])   . Hyperlipidemia   . Hypertension   . Kidney disease     PAST SURGICAL HISTORY: Past Surgical History:  Procedure Laterality Date  . EYE SURGERY    . OTHER SURGICAL HISTORY     growths removed R buttock1970, RU arm 2001, and R wrist 2009.    FAMILY HISTORY: Family History  Problem Relation  Age of Onset  . Diabetes Other   . Heart disease Other   . Hyperlipidemia Other   . Hypertension Other   . Lung cancer Mother   . Hypertension Mother   . Diabetes Mother   . High Cholesterol Mother   . Heart disease Father   . Hypertension Father   . Diabetes Father   . High Cholesterol Father     SOCIAL HISTORY:  Social History   Socioeconomic History  . Marital status: Married    Spouse name: Not on file  . Number of children: Not on file  . Years of education: Not on file  . Highest education level: Not on file  Occupational History  . Not on file  Social Needs  . Financial resource strain: Not on file  . Food insecurity:    Worry: Not on file     Inability: Not on file  . Transportation needs:    Medical: Not on file    Non-medical: Not on file  Tobacco Use  . Smoking status: Former Smoker    Last attempt to quit: 06/07/1986    Years since quitting: 31.3  . Smokeless tobacco: Never Used  Substance and Sexual Activity  . Alcohol use: No    Alcohol/week: 0.0 oz    Comment: quit 1974  . Drug use: No  . Sexual activity: Not on file  Lifestyle  . Physical activity:    Days per week: Not on file    Minutes per session: Not on file  . Stress: Not on file  Relationships  . Social connections:    Talks on phone: Not on file    Gets together: Not on file    Attends religious service: Not on file    Active member of club or organization: Not on file    Attends meetings of clubs or organizations: Not on file    Relationship status: Not on file  . Intimate partner violence:    Fear of current or ex partner: Not on file    Emotionally abused: Not on file    Physically abused: Not on file    Forced sexual activity: Not on file  Other Topics Concern  . Not on file  Social History Narrative   Lives home with wife, [name].  Education college.  One child in [place], [place].  Retired- Agricultural engineer.     PHYSICAL EXAM  GENERAL EXAM/CONSTITUTIONAL: Vitals:  Vitals:   10/22/17 0912  BP: (!) 141/77  Pulse: 74  Weight: 209 lb (94.8 kg)  Height: 5\' 10"  (1.778 m)     Body mass index is 29.99 kg/m.  No exam data present  Patient is in no distress; well developed, nourished and groomed; neck is supple  CARDIOVASCULAR:  Examination of carotid arteries is normal; no carotid bruits  Regular rate and rhythm, no murmurs  Examination of peripheral vascular system by observation and palpation is normal  EYES:  Ophthalmoscopic exam of optic discs and posterior segments is normal; no papilledema or hemorrhages  MUSCULOSKELETAL:  Gait, strength, tone, movements noted in Neurologic exam below  NEUROLOGIC: MENTAL STATUS:   No flowsheet data found.  awake, alert, oriented to person, place and time  recent and remote memory intact  normal attention and concentration  language fluent, comprehension intact, naming intact,   fund of knowledge appropriate  CRANIAL NERVE:   2nd - no papilledema on fundoscopic exam  2nd, 3rd, 4th, 6th - pupils equal and reactive to light, visual fields full  to confrontation, extraocular muscles intact, no nystagmus  5th - facial sensation symmetric  7th - facial strength symmetric  8th - hearing intact  9th - palate elevates symmetrically, uvula midline  11th - shoulder shrug symmetric  12th - tongue protrusion midline  MOTOR:   POSTURAL AND ACTION TREMOR IN RUE > LUE  NO BRADYKINESIA, RESTING TREMOR, COGWHEELING  ATROPHY IN RUE > LUE (PROX)  FASCICULATIONS IN RUE > LUE  RUE (DELTOID 4, BICEPS 4+, TRICEPS 4, GRIP 4, FINGER ABDUCTION 3)  LUE (DELTOID 4+, FINGER ABDUCTION 3-4)  BUE 5  SENSORY:   normal and symmetric to light touch, temperature, vibration; EXCEPT DECR TEMP AND VIB IN LUE AND LLE  COORDINATION:   finger-nose-finger, fine finger movements normal  REFLEXES:   deep tendon reflexes TRACE and symmetric; ABSENT AT ANKLES  GAIT/STATION:   narrow based gait; able to walk on toes, heels; romberg is negative    DIAGNOSTIC DATA (LABS, IMAGING, TESTING) - I reviewed patient records, labs, notes, testing and imaging myself where available.  No results found for: WBC, HGB, HCT, MCV, PLT    Component Value Date/Time   BUN 30 (A) 09/03/2017   CREATININE 1.8 (A) 09/03/2017   Lab Results  Component Value Date   CHOL 109 05/04/2017   HDL 31 (A) 05/04/2017   LDLCALC 66 05/04/2017   TRIG 63 05/04/2017   Lab Results  Component Value Date   HGBA1C 7.1 09/02/2017   No results found for: VITAMINB12 No results found for: TSH      ASSESSMENT AND PLAN  77 y.o. year old male here with gradual onset progressive tremor, muscle  weakness, muscle atrophy, fasciculations, mainly affecting right upper extremity greater than left upper cavity.  We will proceed with further work-up.   Ddx: myopathy, neuromuscular junction disease, motor neuropathy  1. Muscle weakness   2. Fasciculation   3. Tremor   4. Other general symptoms and signs   5. Long-term use of high-risk medication      PLAN:  - check EMG/NCS - check myopathy / neuropathy labs - stop primidone  Orders Placed This Encounter  Procedures  . CK  . Aldolase  . Acetylcholine Receptor, Binding  . ANA w/Reflex if Positive  . TSH  . Vitamin B12  . NCV with EMG(electromyography)   Return for for NCV/EMG.    Penni Bombard, MD 12/06/4101, 0:13 AM Certified in Neurology, Neurophysiology and Neuroimaging  [facility] [address] [place], [place] [address] [phone]

## 2017-10-26 LAB — TSH: TSH: 3.48 u[IU]/mL (ref 0.450–4.500)

## 2017-10-26 LAB — CK: Total CK: 699 U/L (ref 24–204)

## 2017-10-26 LAB — ALDOLASE: Aldolase: 3.2 U/L — ABNORMAL LOW (ref 3.3–10.3)

## 2017-10-26 LAB — ACETYLCHOLINE RECEPTOR, BINDING: AChR Binding Ab, Serum: 0.04 nmol/L (ref 0.00–0.24)

## 2017-10-26 LAB — VITAMIN B12: Vitamin B-12: 353 pg/mL (ref 232–1245)

## 2017-10-26 LAB — ANA W/REFLEX IF POSITIVE: Anti Nuclear Antibody(ANA): NEGATIVE

## 2017-11-02 ENCOUNTER — Telehealth: Payer: Self-pay | Admitting: *Deleted

## 2017-11-02 NOTE — Telephone Encounter (Signed)
LMVM for pt to return call for his lab results.

## 2017-11-02 NOTE — Telephone Encounter (Signed)
Pt returning RN's call.

## 2017-11-02 NOTE — Telephone Encounter (Signed)
Spoke to pt and relayed that results of his lab work.  Elevated CK (could represent myopathy - muscle disease).  He is scheduled 11-25-17 at this time and will look out for cancellation if one comes available.  (needs to be called early am to make an afternoon appt).  He verbalized understanding.

## 2017-11-02 NOTE — Telephone Encounter (Signed)
-----   Message from Penni Bombard, MD sent at 10/29/2017  1:44 PM EDT ----- CK elevated; could represent myopathy (muscle disease). Will follow up with EMG/NCS (already ordered). -VRP

## 2017-11-11 DIAGNOSIS — E119 Type 2 diabetes mellitus without complications: Secondary | ICD-10-CM | POA: Diagnosis not present

## 2017-11-11 DIAGNOSIS — H401121 Primary open-angle glaucoma, left eye, mild stage: Secondary | ICD-10-CM | POA: Diagnosis not present

## 2017-11-12 ENCOUNTER — Other Ambulatory Visit: Payer: Self-pay | Admitting: "Endocrinology

## 2017-11-19 DIAGNOSIS — M1611 Unilateral primary osteoarthritis, right hip: Secondary | ICD-10-CM | POA: Diagnosis not present

## 2017-11-19 DIAGNOSIS — M25551 Pain in right hip: Secondary | ICD-10-CM | POA: Diagnosis not present

## 2017-11-25 ENCOUNTER — Ambulatory Visit (INDEPENDENT_AMBULATORY_CARE_PROVIDER_SITE_OTHER): Payer: Medicare Other | Admitting: Diagnostic Neuroimaging

## 2017-11-25 ENCOUNTER — Encounter (INDEPENDENT_AMBULATORY_CARE_PROVIDER_SITE_OTHER): Payer: Medicare Other | Admitting: Diagnostic Neuroimaging

## 2017-11-25 ENCOUNTER — Encounter

## 2017-11-25 DIAGNOSIS — G729 Myopathy, unspecified: Secondary | ICD-10-CM

## 2017-11-25 DIAGNOSIS — R253 Fasciculation: Secondary | ICD-10-CM | POA: Diagnosis not present

## 2017-11-25 DIAGNOSIS — Z0289 Encounter for other administrative examinations: Secondary | ICD-10-CM

## 2017-11-25 DIAGNOSIS — R251 Tremor, unspecified: Secondary | ICD-10-CM

## 2017-11-25 DIAGNOSIS — R531 Weakness: Secondary | ICD-10-CM

## 2017-11-25 DIAGNOSIS — M6281 Muscle weakness (generalized): Secondary | ICD-10-CM

## 2017-11-26 NOTE — Addendum Note (Signed)
Addended by: Andrey Spearman R on: 11/26/2017 12:40 PM   Modules accepted: Orders

## 2017-11-26 NOTE — Procedures (Signed)
[facility]  NCS (NERVE CONDUCTION STUDY) WITH EMG (ELECTROMYOGRAPHY) REPORT   STUDY DATE: 11/25/17 PATIENT NAME: John Bishop DOB: 02/14/1941 MRN: [id number]  ORDERING CLINICIAN: Andrey Spearman, MD   TECHNOLOGIST: Oneita Jolly ELECTROMYOGRAPHER: Earlean Polka. Shoua Ulloa, MD  CLINICAL INFORMATION: 77 year old male with muscle weakness, tremor, fasciculations, elevated CK level.  Evaluate for myopathy versus motor neuron disease.  FINDINGS: NERVE CONDUCTION STUDY:  Right median and right ulnar motor responses have normal distal latencies, decreased empties, normal conduction velocities.  Left median and left ulnar and right peroneal and right tibial motor responses are normal.  Bilateral radial, bilateral median, bilateral ulnar, right sural and right superficial peroneal sensory responses are normal.  Right tibial F wave latency is normal.   Bilateral ulnar F-wave latencies are normal.   NEEDLE ELECTROMYOGRAPHY:  Myopathic motor unit recruitment noted in right deltoid, right biceps, right vastus medialis and right iliopsoas.  Neurogenic recruitment pattern noted in right triceps, right flexor carpi radialis, right first dorsal interosseous.  Cervical and lumbar paraspinal muscles are unremarkable.  Spontaneous activity noted in the right deltoid, right biceps, right triceps and right flexor carpi radialis.   IMPRESSION:   Abnormal study demonstrating: - Needle EMG demonstrates mixed picture of myopathic motor unit action potentials as well as large motor unit action potential with neurogenic recruitment pattern.  This can be seen with a variety of conditions including inclusion body myositis, inflammatory myopathy (such as polymyositis), facioscapulohumeral dystrophy, or amyloidosis. -No definite evidence of underlying widespread large fiber neuropathy.     INTERPRETING PHYSICIAN:  Penni Bombard, MD Certified in Neurology, Neurophysiology  and Neuroimaging  [facility] [address] [place], [place] [address] [phone]   [facility]    Nerve / Sites Muscle Latency Ref. Amplitude Ref. Rel Amp Segments Distance Velocity Ref. Area    ms ms mV mV %  cm m/s m/s mVms  R Median - APB     Wrist APB 4.3 ?4.4 2.6 ?4.0 100 Wrist - APB 7   7.8     Upper arm APB 9.0  2.4  92.3 Upper arm - Wrist 25 53 ?49 7.7  L Median - APB     Wrist APB 3.9 ?4.4 5.7 ?4.0 100 Wrist - APB 7   18.7     Upper arm APB 8.8  5.2  91.1 Upper arm - Wrist 25 52 ?49 15.9  R Ulnar - ADM     Wrist ADM 3.1 ?3.3 4.2 ?6.0 100 Wrist - ADM 7   10.7     B.Elbow ADM 7.5  4.1  95.9 B.Elbow - Wrist 24 54 ?49 10.2     A.Elbow ADM 9.4  3.6  89.2 A.Elbow - B.Elbow 10 52 ?49 9.7         A.Elbow - Wrist      L Ulnar - ADM     Wrist ADM 3.3 ?3.3 6.2 ?6.0 100 Wrist - ADM 7   13.0     B.Elbow ADM 7.8  6.0  97.3 B.Elbow - Wrist 24 53 ?49 14.6     A.Elbow ADM 10.0  5.5  92.4 A.Elbow - B.Elbow 10 46 ?49 15.6         A.Elbow - Wrist      R Peroneal - EDB     Ankle EDB 4.0 ?6.5 9.6 ?2.0 100 Ankle - EDB 9   25.7     Fib head EDB 11.1  8.2  84.9 Fib head - Ankle  34 48 ?44 23.5     Pop fossa EDB 13.2  8.1  98.4 Pop fossa - Fib head 10 48 ?44 22.6         Pop fossa - Ankle      R Tibial - AH     Ankle AH 4.1 ?5.8 7.4 ?4.0 100 Ankle - AH 9   15.5     Pop fossa AH 13.3  4.8  64.6 Pop fossa - Ankle 39 42 ?41 12.6                 SNC    Nerve / Sites Rec. Site Peak Lat Ref.  Amp Ref. Segments Distance Peak Diff Ref.    ms ms V V  cm ms ms  R Radial - Anatomical snuff box (Forearm)     Forearm Wrist 2.4 ?2.9 7 ?15 Forearm - Wrist 10    L Radial - Anatomical snuff box (Forearm)     Forearm Wrist 2.4 ?2.9 16 ?15 Forearm - Wrist 10    R Sural - Ankle (Calf)     Calf Ankle 4.3 ?4.4 6 ?6 Calf - Ankle 14    R Superficial peroneal - Ankle     Lat leg Ankle 4.2 ?4.4 6 ?6 Lat leg - Ankle 14    R Median, Ulnar - Transcarpal comparison     Median Palm Wrist 2.2  ?2.2 15 ?35 Median Palm - Wrist 8       Ulnar Palm Wrist 2.1 ?2.2 8 ?12 Ulnar Palm - Wrist 8          Median Palm - Ulnar Palm  0.1 ?0.4  R Median - Orthodromic (Dig II, Mid palm)     Dig II Wrist 3.2 ?3.4 10 ?10 Dig II - Wrist 13    L Median - Orthodromic (Dig II, Mid palm)     Dig II Wrist 3.2 ?3.4 9 ?10 Dig II - Wrist 13    R Ulnar - Orthodromic, (Dig V, Mid palm)     Dig V Wrist 2.8 ?3.1 6 ?5 Dig V - Wrist 11    L Ulnar - Orthodromic, (Dig V, Mid palm)     Dig V Wrist 2.9 ?3.1 8 ?5 Dig V - Wrist 22                         F  Wave    Nerve F Lat Ref.   ms ms  R Tibial - AH 47.5 ?56.0  R Ulnar - ADM 37.1 ?32.0  L Ulnar - ADM 35.1 ?32.0           EMG full       EMG Summary Table    Spontaneous MUAP Recruitment  Muscle IA Fib PSW Fasc Other Amp Dur. Poly Pattern  R. Deltoid Normal 1+ 2+ None _______ Decreased Normal 1+ Early  R. Biceps brachii Normal 1+ 2+ None _______ Normal Normal 1+ Early  R. Triceps brachii Increased 1+ None None _______ Increased Normal Normal Discrete  R. Flexor carpi radialis Normal 1+ 2+ None _______ Increased Normal 1+ Reduced  R. First dorsal interosseous Increased None None None _______ Normal Normal Normal Reduced  R. Tibialis anterior Normal None None None _______ Normal Normal Normal Normal  R. Gastrocnemius (Medial head) Normal None None None _______ Normal Normal Normal Normal  R. Vastus medialis Normal None None None _______ Decreased Normal 1+ Early  R. Iliopsoas Normal None None None  _______ Normal Normal 1+ Early  R. Gluteus medius Normal None None None _______ Normal Normal Normal Normal  R. Lumbar paraspinals (low) Normal None None None _______ Normal Normal Normal Normal  R. Cervical paraspinals Normal None None None _______ Normal Normal Normal Normal

## 2017-12-20 ENCOUNTER — Other Ambulatory Visit: Payer: Self-pay | Admitting: "Endocrinology

## 2017-12-28 DIAGNOSIS — G729 Myopathy, unspecified: Secondary | ICD-10-CM | POA: Diagnosis not present

## 2017-12-28 DIAGNOSIS — I10 Essential (primary) hypertension: Secondary | ICD-10-CM | POA: Diagnosis not present

## 2017-12-28 DIAGNOSIS — Z6829 Body mass index (BMI) 29.0-29.9, adult: Secondary | ICD-10-CM | POA: Diagnosis not present

## 2017-12-29 ENCOUNTER — Other Ambulatory Visit: Payer: Self-pay | Admitting: Neurosurgery

## 2017-12-30 ENCOUNTER — Other Ambulatory Visit: Payer: Self-pay

## 2017-12-30 ENCOUNTER — Encounter (HOSPITAL_COMMUNITY): Payer: Self-pay | Admitting: *Deleted

## 2017-12-30 NOTE — Progress Notes (Addendum)
John Bishop denies chest pain or shortness of breath at rest, just with exertion.  Patient has type Ii diabetes and is fololowed By Dr Dorris Fetch, also has CKD Stage III , patient sees Dr Mamie Nick. [place] [employer].  John Bishop reports that CBG runs 94 - 134, last A!C was 7.1 drawn 3/28/ 28. I instructed John Bishop that if CBG > 70 in am to take 1/2 dose of Xultophy, 25 units. I instructed patient to check CBG after awaking and every 2 hours until arrival  to the hospital.  I Instructed patient if CBG is less than 70 to take 4 Glucose Tablets.Recheck CBG in 15 minutes then call pre- op desk at [phone] for further instructions. If scheduled to receive Insulin, do not take Insulin.  John Bishop voiced understanding of instructions.  John Bishop' cardiology is Dr Harl Bowie, he was seen last 01/2017.

## 2017-12-31 ENCOUNTER — Ambulatory Visit (HOSPITAL_COMMUNITY): Payer: Medicare Other | Admitting: Physician Assistant

## 2017-12-31 ENCOUNTER — Encounter (HOSPITAL_COMMUNITY): Payer: Self-pay | Admitting: *Deleted

## 2017-12-31 ENCOUNTER — Ambulatory Visit (HOSPITAL_COMMUNITY)
Admission: RE | Admit: 2017-12-31 | Discharge: 2017-12-31 | Disposition: A | Discharge status: Home / Self Care | Payer: Medicare Other | Source: Ambulatory Visit | Attending: Neurosurgery | Admitting: Neurosurgery

## 2017-12-31 ENCOUNTER — Encounter (HOSPITAL_COMMUNITY)
Admission: RE | Disposition: A | Discharge status: Home / Self Care | Payer: Self-pay | Source: Ambulatory Visit | Attending: Neurosurgery

## 2017-12-31 DIAGNOSIS — Z79899 Other long term (current) drug therapy: Secondary | ICD-10-CM | POA: Diagnosis not present

## 2017-12-31 DIAGNOSIS — G729 Myopathy, unspecified: Secondary | ICD-10-CM | POA: Insufficient documentation

## 2017-12-31 DIAGNOSIS — M1990 Unspecified osteoarthritis, unspecified site: Secondary | ICD-10-CM | POA: Insufficient documentation

## 2017-12-31 DIAGNOSIS — I129 Hypertensive chronic kidney disease with stage 1 through stage 4 chronic kidney disease, or unspecified chronic kidney disease: Secondary | ICD-10-CM | POA: Insufficient documentation

## 2017-12-31 DIAGNOSIS — Z87891 Personal history of nicotine dependence: Secondary | ICD-10-CM | POA: Diagnosis not present

## 2017-12-31 DIAGNOSIS — N183 Chronic kidney disease, stage 3 (moderate): Secondary | ICD-10-CM | POA: Diagnosis not present

## 2017-12-31 DIAGNOSIS — Z8249 Family history of ischemic heart disease and other diseases of the circulatory system: Secondary | ICD-10-CM | POA: Diagnosis not present

## 2017-12-31 DIAGNOSIS — Z7982 Long term (current) use of aspirin: Secondary | ICD-10-CM | POA: Diagnosis not present

## 2017-12-31 DIAGNOSIS — E785 Hyperlipidemia, unspecified: Secondary | ICD-10-CM | POA: Diagnosis not present

## 2017-12-31 DIAGNOSIS — K760 Fatty (change of) liver, not elsewhere classified: Secondary | ICD-10-CM | POA: Diagnosis not present

## 2017-12-31 DIAGNOSIS — N189 Chronic kidney disease, unspecified: Secondary | ICD-10-CM | POA: Insufficient documentation

## 2017-12-31 DIAGNOSIS — E1122 Type 2 diabetes mellitus with diabetic chronic kidney disease: Secondary | ICD-10-CM | POA: Diagnosis not present

## 2017-12-31 DIAGNOSIS — Z794 Long term (current) use of insulin: Secondary | ICD-10-CM | POA: Insufficient documentation

## 2017-12-31 HISTORY — DX: Unspecified osteoarthritis, unspecified site: M19.90

## 2017-12-31 HISTORY — PX: MUSCLE BIOPSY: SHX716

## 2017-12-31 HISTORY — DX: Fatty (change of) liver, not elsewhere classified: K76.0

## 2017-12-31 LAB — GLUCOSE, CAPILLARY
Glucose-Capillary: 114 mg/dL — ABNORMAL HIGH (ref 70–99)
Glucose-Capillary: 90 mg/dL (ref 70–99)

## 2017-12-31 LAB — BASIC METABOLIC PANEL
Anion gap: 9 (ref 5–15)
BUN: 23 mg/dL (ref 8–23)
CHLORIDE: 112 mmol/L — AB (ref 98–111)
CO2: 21 mmol/L — AB (ref 22–32)
CREATININE: 1.63 mg/dL — AB (ref 0.61–1.24)
Calcium: 9 mg/dL (ref 8.9–10.3)
GFR calc Af Amer: 46 mL/min — ABNORMAL LOW (ref >60)
GFR calc non Af Amer: 39 mL/min — ABNORMAL LOW (ref >60)
Glucose, Bld: 133 mg/dL — ABNORMAL HIGH (ref 70–99)
POTASSIUM: 4.4 mmol/L (ref 3.5–5.1)
Sodium: 142 mmol/L (ref 135–145)

## 2017-12-31 LAB — CBC
HEMATOCRIT: 38.7 % — AB (ref 39.0–52.0)
Hemoglobin: 12.5 g/dL — ABNORMAL LOW (ref 13.0–17.0)
MCH: 29.1 pg (ref 26.0–34.0)
MCHC: 32.3 g/dL (ref 30.0–36.0)
MCV: 90.2 fL (ref 78.0–100.0)
PLATELETS: 165 10*3/uL (ref 150–400)
RBC: 4.29 MIL/uL (ref 4.22–5.81)
RDW: 14.5 % (ref 11.5–15.5)
WBC: 4.6 10*3/uL (ref 4.0–10.5)

## 2017-12-31 LAB — HEMOGLOBIN A1C
Hgb A1c MFr Bld: 7 % — ABNORMAL HIGH (ref 4.8–5.6)
Mean Plasma Glucose: 154.2 mg/dL

## 2017-12-31 SURGERY — BIOPSY, MUSCLE, GASTROCNEMIUS
Anesthesia: General | Laterality: Left

## 2017-12-31 MED ORDER — ACETAMINOPHEN 325 MG PO TABS: 325.0000 mg | ORAL_TABLET | ORAL | Status: DC | PRN

## 2017-12-31 MED ORDER — PROPOFOL 10 MG/ML IV BOLUS
INTRAVENOUS | Status: DC | PRN
  Administered 2017-12-31: 200 mg via INTRAVENOUS

## 2017-12-31 MED ORDER — 0.9 % SODIUM CHLORIDE (POUR BTL) OPTIME
TOPICAL | Status: DC | PRN
  Administered 2017-12-31: 1000 mL

## 2017-12-31 MED ORDER — LIDOCAINE 2% (20 MG/ML) 5 ML SYRINGE
INTRAMUSCULAR | Status: DC | PRN
  Administered 2017-12-31: 100 mg via INTRAVENOUS

## 2017-12-31 MED ORDER — ONDANSETRON HCL 4 MG/2ML IJ SOLN
INTRAMUSCULAR | Status: DC | PRN
  Administered 2017-12-31: 4 mg via INTRAVENOUS

## 2017-12-31 MED ORDER — MEPERIDINE HCL 50 MG/ML IJ SOLN: 6.2500 mg | INTRAMUSCULAR | Status: DC | PRN

## 2017-12-31 MED ORDER — FENTANYL CITRATE (PF) 250 MCG/5ML IJ SOLN
INTRAMUSCULAR | Status: DC | PRN
  Administered 2017-12-31: 50 ug via INTRAVENOUS
  Administered 2017-12-31: 25 ug via INTRAVENOUS

## 2017-12-31 MED ORDER — OXYCODONE HCL 5 MG/5ML PO SOLN: 5.0000 mg | Freq: Once | ORAL | Status: DC | PRN

## 2017-12-31 MED ORDER — ONDANSETRON HCL 4 MG/2ML IJ SOLN: 4.0000 mg | Freq: Once | INTRAMUSCULAR | Status: DC | PRN

## 2017-12-31 MED ORDER — ACETAMINOPHEN 160 MG/5ML PO SOLN: 325.0000 mg | ORAL | Status: DC | PRN

## 2017-12-31 MED ORDER — PROPOFOL 10 MG/ML IV BOLUS
INTRAVENOUS | Status: AC
  Filled 2017-12-31: qty 20

## 2017-12-31 MED ORDER — ACETAMINOPHEN-CODEINE #3 300-30 MG PO TABS: 1.0000 | ORAL_TABLET | Freq: Four times a day (QID) | ORAL | 0 refills | Status: DC | PRN

## 2017-12-31 MED ORDER — FENTANYL CITRATE (PF) 250 MCG/5ML IJ SOLN
INTRAMUSCULAR | Status: AC
Start: 2017-12-31 — End: ?
  Filled 2017-12-31: qty 5

## 2017-12-31 MED ORDER — LACTATED RINGERS IV SOLN
INTRAVENOUS | Status: DC
  Administered 2017-12-31: 08:00:00 via INTRAVENOUS

## 2017-12-31 MED ORDER — LIDOCAINE-EPINEPHRINE 0.5 %-1:200000 IJ SOLN
INTRAMUSCULAR | Status: DC | PRN
  Administered 2017-12-31: 10 mL

## 2017-12-31 MED ORDER — LIDOCAINE-EPINEPHRINE 0.5 %-1:200000 IJ SOLN
INTRAMUSCULAR | Status: AC
  Filled 2017-12-31: qty 1

## 2017-12-31 MED ORDER — LIDOCAINE 2% (20 MG/ML) 5 ML SYRINGE
INTRAMUSCULAR | Status: AC
  Filled 2017-12-31: qty 5

## 2017-12-31 MED ORDER — ONDANSETRON HCL 4 MG/2ML IJ SOLN
INTRAMUSCULAR | Status: AC
  Filled 2017-12-31: qty 2

## 2017-12-31 MED ORDER — CEFAZOLIN SODIUM-DEXTROSE 2-3 GM-%(50ML) IV SOLR
INTRAVENOUS | Status: DC | PRN
  Administered 2017-12-31: 2 g via INTRAVENOUS

## 2017-12-31 MED ORDER — FENTANYL CITRATE (PF) 100 MCG/2ML IJ SOLN: 25.0000 ug | INTRAMUSCULAR | Status: DC | PRN

## 2017-12-31 MED ORDER — OXYCODONE HCL 5 MG PO TABS: 5.0000 mg | ORAL_TABLET | Freq: Once | ORAL | Status: DC | PRN

## 2017-12-31 MED ORDER — PHENYLEPHRINE HCL 10 MG/ML IJ SOLN
INTRAMUSCULAR | Status: DC | PRN
  Administered 2017-12-31: 80 ug via INTRAVENOUS
  Administered 2017-12-31 (×2): 120 ug via INTRAVENOUS
  Administered 2017-12-31: 80 ug via INTRAVENOUS

## 2017-12-31 MED ORDER — CEFAZOLIN SODIUM 1 G IJ SOLR
INTRAMUSCULAR | Status: AC
  Filled 2017-12-31: qty 20

## 2017-12-31 MED ORDER — FENTANYL CITRATE (PF) 250 MCG/5ML IJ SOLN
INTRAMUSCULAR | Status: AC
  Filled 2017-12-31: qty 5

## 2017-12-31 SURGICAL SUPPLY — 68 items
BANDAGE ACE 4X5 VEL STRL LF (GAUZE/BANDAGES/DRESSINGS) IMPLANT
BANDAGE ACE 6X5 VEL STRL LF (GAUZE/BANDAGES/DRESSINGS) IMPLANT
BENZOIN TINCTURE PRP APPL 2/3 (GAUZE/BANDAGES/DRESSINGS) ×3 IMPLANT
BLADE CLIPPER SURG (BLADE) IMPLANT
BLADE SURG 15 STRL LF DISP TIS (BLADE) ×1 IMPLANT
BLADE SURG 15 STRL SS (BLADE) ×2
BNDG GAUZE ELAST 4 BULKY (GAUZE/BANDAGES/DRESSINGS) ×3 IMPLANT
CABLE BIPOLOR RESECTION CORD (MISCELLANEOUS) ×3 IMPLANT
CARTRIDGE OIL MAESTRO DRILL (MISCELLANEOUS) ×1 IMPLANT
CLOSURE WOUND 1/2 X4 (GAUZE/BANDAGES/DRESSINGS) ×1
DECANTER SPIKE VIAL GLASS SM (MISCELLANEOUS) ×3 IMPLANT
DERMABOND ADVANCED (GAUZE/BANDAGES/DRESSINGS) ×2
DERMABOND ADVANCED .7 DNX12 (GAUZE/BANDAGES/DRESSINGS) ×1 IMPLANT
DIFFUSER DRILL AIR PNEUMATIC (MISCELLANEOUS) ×3 IMPLANT
DRAPE HALF SHEET 40X57 (DRAPES) ×3 IMPLANT
DRAPE INCISE IOBAN 66X45 STRL (DRAPES) ×3 IMPLANT
DRAPE LAPAROTOMY 100X72 PEDS (DRAPES) IMPLANT
DURAPREP 26ML APPLICATOR (WOUND CARE) ×3 IMPLANT
GAUZE SPONGE 4X4 16PLY XRAY LF (GAUZE/BANDAGES/DRESSINGS) ×3 IMPLANT
GLOVE BIO SURGEON STRL SZ 6.5 (GLOVE) IMPLANT
GLOVE BIO SURGEON STRL SZ7 (GLOVE) IMPLANT
GLOVE BIO SURGEON STRL SZ7.5 (GLOVE) IMPLANT
GLOVE BIO SURGEON STRL SZ8 (GLOVE) IMPLANT
GLOVE BIO SURGEON STRL SZ8.5 (GLOVE) IMPLANT
GLOVE BIO SURGEONS STRL SZ 6.5 (GLOVE)
GLOVE BIOGEL M 8.0 STRL (GLOVE) IMPLANT
GLOVE ECLIPSE 6.5 STRL STRAW (GLOVE) ×6 IMPLANT
GLOVE ECLIPSE 7.0 STRL STRAW (GLOVE) IMPLANT
GLOVE ECLIPSE 7.5 STRL STRAW (GLOVE) IMPLANT
GLOVE ECLIPSE 8.0 STRL XLNG CF (GLOVE) IMPLANT
GLOVE ECLIPSE 8.5 STRL (GLOVE) IMPLANT
GLOVE EXAM NITRILE LRG STRL (GLOVE) IMPLANT
GLOVE EXAM NITRILE XL STR (GLOVE) IMPLANT
GLOVE EXAM NITRILE XS STR PU (GLOVE) IMPLANT
GLOVE INDICATOR 6.5 STRL GRN (GLOVE) IMPLANT
GLOVE INDICATOR 7.0 STRL GRN (GLOVE) IMPLANT
GLOVE INDICATOR 7.5 STRL GRN (GLOVE) IMPLANT
GLOVE INDICATOR 8.0 STRL GRN (GLOVE) IMPLANT
GLOVE INDICATOR 8.5 STRL (GLOVE) IMPLANT
GLOVE OPTIFIT SS 8.0 STRL (GLOVE) IMPLANT
GLOVE SURG SS PI 6.5 STRL IVOR (GLOVE) IMPLANT
GOWN STRL REUS W/ TWL LRG LVL3 (GOWN DISPOSABLE) ×1 IMPLANT
GOWN STRL REUS W/ TWL XL LVL3 (GOWN DISPOSABLE) IMPLANT
GOWN STRL REUS W/TWL 2XL LVL3 (GOWN DISPOSABLE) IMPLANT
GOWN STRL REUS W/TWL LRG LVL3 (GOWN DISPOSABLE) ×2
GOWN STRL REUS W/TWL XL LVL3 (GOWN DISPOSABLE)
KIT BASIN OR (CUSTOM PROCEDURE TRAY) ×3 IMPLANT
KIT TURNOVER KIT B (KITS) ×3 IMPLANT
MARKER SKIN DUAL TIP RULER LAB (MISCELLANEOUS) ×3 IMPLANT
NEEDLE HYPO 25X1 1.5 SAFETY (NEEDLE) ×3 IMPLANT
NS IRRIG 1000ML POUR BTL (IV SOLUTION) ×3 IMPLANT
OIL CARTRIDGE MAESTRO DRILL (MISCELLANEOUS) ×3
PACK SURGICAL SETUP 50X90 (CUSTOM PROCEDURE TRAY) ×3 IMPLANT
PAD ARMBOARD 7.5X6 YLW CONV (MISCELLANEOUS) ×3 IMPLANT
SPECIMEN JAR SMALL (MISCELLANEOUS) ×3 IMPLANT
STRIP CLOSURE SKIN 1/2X4 (GAUZE/BANDAGES/DRESSINGS) ×2 IMPLANT
SUT SILK 0 TIES 10X30 (SUTURE) ×3 IMPLANT
SUT SILK 2 0 TIES 10X30 (SUTURE) ×3 IMPLANT
SUT SILK 3 0 TIES 17X18 (SUTURE) ×2
SUT SILK 3-0 18XBRD TIE BLK (SUTURE) ×1 IMPLANT
SUT VIC AB 2-0 CT1 27 (SUTURE) ×2
SUT VIC AB 2-0 CT1 27XBRD (SUTURE) ×1 IMPLANT
SUT VIC AB 3-0 SH 8-18 (SUTURE) ×3 IMPLANT
SYR BULB 3OZ (MISCELLANEOUS) ×3 IMPLANT
SYR CONTROL 10ML LL (SYRINGE) ×3 IMPLANT
TOWEL GREEN STERILE (TOWEL DISPOSABLE) ×3 IMPLANT
TOWEL GREEN STERILE FF (TOWEL DISPOSABLE) ×3 IMPLANT
WATER STERILE IRR 1000ML POUR (IV SOLUTION) ×3 IMPLANT

## 2017-12-31 NOTE — Transfer of Care (Signed)
Immediate Anesthesia Transfer of Care Note  Patient: John Bishop  Procedure(s) Performed: Left Deltoid Muscle Biopsy (Left )  Patient Location: PACU  Anesthesia Type:General  Level of Consciousness: sedated, patient cooperative and responds to stimulation  Airway & Oxygen Therapy: Patient Spontanous Breathing and Patient connected to face mask oxygen  Post-op Assessment: Report given to RN and Post -op Vital signs reviewed and stable  Post vital signs: Reviewed and stable  Last Vitals:  Vitals Value Taken Time  BP 150/89 12/31/2017 11:25 AM  Temp    Pulse 54 12/31/2017 11:31 AM  Resp 8 12/31/2017 11:31 AM  SpO2 100 % 12/31/2017 11:31 AM  Vitals shown include unvalidated device data.  Last Pain:  Vitals:   12/31/17 0820  TempSrc:   PainSc: 0-No pain      Patients Stated Pain Goal: 3 (69/48/54 6270)  Complications: No apparent anesthesia complications

## 2017-12-31 NOTE — H&P (Signed)
John John is an 77 y.o. male.   Chief Complaint: myopathy HPI: unknown etiology for myopathy, here for biopsy.   Past Medical History:  Diagnosis Date  . Arthritis   . Diabetes mellitus, type II ([place])   . Fatty liver   . Hyperlipidemia   . Hypertension   . Kidney disease     Past Surgical History:  Procedure Laterality Date  . COLONOSCOPY W/ POLYPECTOMY    . EYE SURGERY Bilateral    catarcts with lens  . OTHER SURGICAL HISTORY     growths removed R buttock1970, RU arm 2001, and R wrist 2009.    Family History  Problem Relation Age of Onset  . Diabetes Other   . Heart disease Other   . Hyperlipidemia Other   . Hypertension Other   . Lung cancer Mother   . Hypertension Mother   . Diabetes Mother   . High Cholesterol Mother   . Heart disease Father   . Hypertension Father   . Diabetes Father   . High Cholesterol Father    Social History:  reports that he quit smoking about 31 years ago. He has never used smokeless tobacco. He reports that he does not drink alcohol or use drugs.  Allergies: No Known Allergies  Medications Prior to Admission  Medication Sig Dispense Refill  . amLODipine (NORVASC) 10 MG tablet TAKE 1 TABLET BY MOUTH EVERY DAY 90 tablet 1  . aspirin 81 MG tablet Take 81 mg by mouth daily.    Marland Kitchen atorvastatin (LIPITOR) 40 MG tablet Take 1 tablet (40 mg total) by mouth daily. (Patient taking differently: Take 40 mg by mouth at bedtime. ) 90 tablet 1  . Cholecalciferol (VITAMIN D3) 2000 UNITS TABS Take 2,000 Units by mouth 2 (two) times daily.     . isosorbide mononitrate (IMDUR) 30 MG 24 hr tablet TAKE 1 TABLET (30 MG TOTAL) BY MOUTH DAILY. 90 tablet 3  . latanoprost (XALATAN) 0.005 % ophthalmic solution Place 1 drop into the left eye at bedtime.    Marland Kitchen losartan-hydrochlorothiazide (HYZAAR) 100-25 MG tablet Take 1 tablet by mouth daily.    . metoprolol succinate (TOPROL XL) 25 MG 24 hr tablet Take 1.5 tablets (37.5 mg total) by mouth 2 (two) times daily.  270 tablet 1  . Omega-3 Fatty Acids (FISH OIL) 1200 MG CAPS Take 1,200 mg by mouth 2 (two) times daily.     [name] 100-3.6 UNIT-MG/ML SOPN INJECT 50 UNITS INTO THE SKIN DAILY WITH BREAKFAST. 15 mL 2  . CONTOUR NEXT TEST test strip USE AS DIRECTED THREE TIMES A DAY. (Patient not taking: Reported on 12/29/2017) 300 each 1  . Insulin Pen Needle (BD PEN NEEDLE NANO U/F) 32G X 4 MM MISC USE AS DIRECTED EVERY MORNING (Patient not taking: Reported on 12/29/2017) 100 each 2  . Insulin Pen Needle (PEN NEEDLES) 31G X 8 MM MISC Use qhs (Patient not taking: Reported on 12/29/2017) 100 each 5    Results for orders placed or performed during the hospital encounter of 12/31/17 (from the past 48 hour(s))  Glucose, capillary     Status: Abnormal   Collection Time: 12/31/17  7:04 AM  Result Value Ref Range   Glucose-Capillary 114 (H) 70 - 99 mg/dL  Hemoglobin A1c     Status: Abnormal   Collection Time: 12/31/17  7:18 AM  Result Value Ref Range   Hgb A1c MFr Bld 7.0 (H) 4.8 - 5.6 %    Comment: (NOTE) Pre diabetes:  5.7%-6.4% Diabetes:              >6.4% Glycemic control for   <7.0% adults with diabetes    Mean Plasma Glucose 154.2 mg/dL    Comment: Performed at [facility] [address], [place], [place] [address]  Basic metabolic panel     Status: Abnormal   Collection Time: 12/31/17  7:18 AM  Result Value Ref Range   Sodium 142 135 - 145 mmol/L   Potassium 4.4 3.5 - 5.1 mmol/L   Chloride 112 (H) 98 - 111 mmol/L   CO2 21 (L) 22 - 32 mmol/L   Glucose, Bld 133 (H) 70 - 99 mg/dL   BUN 23 8 - 23 mg/dL   Creatinine, Ser 1.63 (H) 0.61 - 1.24 mg/dL   Calcium 9.0 8.9 - 10.3 mg/dL   GFR calc non Af Amer 39 (L) >60 mL/min   GFR calc Af Amer 46 (L) >60 mL/min    Comment: (NOTE) The eGFR has been calculated using the CKD EPI equation. This calculation has not been validated in all clinical situations. eGFR's persistently <60 mL/min signify possible Chronic Kidney Disease.    Anion gap  9 5 - 15    Comment: Performed at [facility] [address] [place], [place] [address]  CBC     Status: Abnormal   Collection Time: 12/31/17  7:18 AM  Result Value Ref Range   WBC 4.6 4.0 - 10.5 K/uL   RBC 4.29 4.22 - 5.81 MIL/uL   Hemoglobin 12.5 (L) 13.0 - 17.0 g/dL   HCT 38.7 (L) 39.0 - 52.0 %   MCV 90.2 78.0 - 100.0 fL   MCH 29.1 26.0 - 34.0 pg   MCHC 32.3 30.0 - 36.0 g/dL   RDW 14.5 11.5 - 15.5 %   Platelets 165 150 - 400 K/uL    Comment: Performed at [facility], [place] [address], [place],  [address]   No results found.  ROS  Blood pressure (!) 154/64, pulse 69, temperature 98.9 F (37.2 C), temperature source Oral, height 5' 10" (1.778 m), weight 93.4 kg (206 lb), SpO2 100 %. Physical Exam  Constitutional: He is oriented to person, place, and time. He appears well-developed and well-nourished. No distress.  HENT:  Head: Normocephalic and atraumatic.  Right Ear: External ear normal.  Left Ear: External ear normal.  Eyes: Pupils are equal, round, and reactive to light. Conjunctivae and EOM are normal.  Neck: Normal range of motion. Neck supple.  Cardiovascular: Normal rate, regular rhythm and normal heart sounds.  Respiratory: Effort normal and breath sounds normal.  GI: Soft. Bowel sounds are normal.  Musculoskeletal: Normal range of motion.  Neurological: He is alert and oriented to person, place, and time. He has normal reflexes. He displays normal reflexes. A cranial nerve deficit is present. He exhibits normal muscle tone. Coordination normal.  Skin: Skin is warm and dry.  Psychiatric: He has a normal mood and affect. His behavior is normal. Judgment and thought content normal.     Assessment/Plan Deltoid muscle biopsy  CABBELL,KYLE L, MD 12/31/2017, 10:16 AM

## 2017-12-31 NOTE — Anesthesia Preprocedure Evaluation (Signed)
Anesthesia Evaluation  Patient identified by MRN, date of birth, ID band Patient awake    Reviewed: Allergy & Precautions, H&P , NPO status , Patient's Chart, lab work & pertinent test results, reviewed documented beta blocker date and time   Airway Mallampati: II  TM Distance: >3 FB Neck ROM: full    Dental no notable dental hx.    Pulmonary former smoker,    Pulmonary exam normal breath sounds clear to auscultation       Cardiovascular Exercise Tolerance: Good hypertension, Pt. on medications and Pt. on home beta blockers  Rhythm:regular Rate:Normal     Neuro/Psych    GI/Hepatic   Endo/Other  negative endocrine ROSdiabetes  Renal/GU CRFRenal disease     Musculoskeletal  (+) Arthritis , Osteoarthritis,    Abdominal   Peds  Hematology   Anesthesia Other Findings   Reproductive/Obstetrics                             Anesthesia Physical Anesthesia Plan  ASA: III  Anesthesia Plan: General   Post-op Pain Management:    Induction: Intravenous  PONV Risk Score and Plan: 1 and Ondansetron and Treatment may vary due to age or medical condition  Airway Management Planned: LMA  Additional Equipment:   Intra-op Plan:   Post-operative Plan:   Informed Consent: I have reviewed the patients History and Physical, chart, labs and discussed the procedure including the risks, benefits and alternatives for the proposed anesthesia with the patient or authorized representative who has indicated his/her understanding and acceptance.   Dental Advisory Given  Plan Discussed with: CRNA, Anesthesiologist and Surgeon  Anesthesia Plan Comments: ( )        Anesthesia Quick Evaluation

## 2017-12-31 NOTE — Anesthesia Procedure Notes (Signed)
Performed by: Maily Debarge R, CRNA       

## 2017-12-31 NOTE — Op Note (Signed)
12/31/2017  12:01 PM  PATIENT:  John Bishop  77 y.o. male  PRE-OPERATIVE DIAGNOSIS:  Myopathy  POST-OPERATIVE DIAGNOSIS:  myopathy  PROCEDURE:  Procedure(s): Left Deltoid Muscle Biopsy  SURGEON: Surgeon(s): Ashok Pall, MD  ASSISTANTS:none  ANESTHESIA:   general  EBL:  Total I/O In: 620 [P.O.:120; I.V.:500] Out: 10 [Blood:10]  BLOOD ADMINISTERED:none    COUNT:per nursing  DRAINS: none   SPECIMEN:  Source of Specimen:  left deltoid muscle  DICTATION: John Bishop was taken to the operating room, intubated, and placed under a general anesthetic without difficulty. He was positioned supine. His left arm was prepped and draped in a sterile manner. I made a horizontal incision over the deltoid with a 15 blade. I dissected sharply to the deltoid fascia. I opened that horizontally. I then dissected and removed three pieces of muscle. I infiltrated the biopsy site with lidocaine, then closed. I closed the fascia, subcutaneous, and subcuticular layers with vicryl sutures. I used dermabond for a dressing. He was awakened, and extubated with ease.   PLAN OF CARE: Discharge to home after PACU  PATIENT DISPOSITION:  PACU - hemodynamically stable.   Delay start of Pharmacological VTE agent (>24hrs) due to surgical blood loss or risk of bleeding:  not applicable

## 2017-12-31 NOTE — Anesthesia Postprocedure Evaluation (Signed)
Anesthesia Post Note  Patient: NEKO BOYAJIAN  Procedure(s) Performed: Left Deltoid Muscle Biopsy (Left )     Patient location during evaluation: PACU Anesthesia Type: General Level of consciousness: awake and alert Pain management: pain level controlled Vital Signs Assessment: post-procedure vital signs reviewed and stable Respiratory status: spontaneous breathing, nonlabored ventilation, respiratory function stable and patient connected to nasal cannula oxygen Cardiovascular status: blood pressure returned to baseline and stable Postop Assessment: no apparent nausea or vomiting Anesthetic complications: no    Last Vitals:  Vitals:   12/31/17 1145 12/31/17 1215  BP: 128/60 139/80  Pulse: 62 64  Resp: 13 13  Temp: (!) 36.1 C   SpO2: 100% 99%    Last Pain:  Vitals:   12/31/17 1215  TempSrc:   PainSc: 0-No pain                 Cason Luffman

## 2017-12-31 NOTE — Anesthesia Procedure Notes (Signed)
Procedure Name: LMA Insertion Date/Time: 12/31/2017 10:35 AM Performed by: Marsa Aris, CRNA Pre-anesthesia Checklist: Patient identified, Emergency Drugs available, Suction available, Patient being monitored and Timeout performed Patient Re-evaluated:Patient Re-evaluated prior to induction Oxygen Delivery Method: Circle system utilized Preoxygenation: Pre-oxygenation with 100% oxygen Induction Type: IV induction LMA: LMA inserted LMA Size: 5.0 Number of attempts: 1 Placement Confirmation: ETT inserted through vocal cords under direct vision,  positive ETCO2,  breath sounds checked- equal and bilateral and CO2 detector Tube secured with: Tape Dental Injury: Teeth and Oropharynx as per pre-operative assessment  Comments: Dentition the same as pre-procedure

## 2017-12-31 NOTE — Discharge Instructions (Signed)
Muscle Biopsy, Care After Refer to this sheet in the next few weeks. These instructions provide you with information about caring for yourself after your procedure. Your health care provider may also give you more specific instructions. Your treatment has been planned according to current medical practices, but problems sometimes occur. Call your health care provider if you have any problems or questions after your procedure. What can I expect after the procedure? After your procedure, it is common to have soreness and tenderness at the site of the biopsy. This may last for a few days. Follow these instructions at home: Biopsy Site Care   Follow instructions from your health care provider about how to take care of your biopsy site. Make sure you: ? Change any bandages (dressings) as told by your health care provider. ? Wash your hands with soap and water before you change your bandage (dressing). If soap and water are not available, use hand sanitizer. ? Leave any stitches (sutures), skin glue, or adhesive strips in place. These skin closures may need to stay in place for 2 weeks or longer. If adhesive strip edges start to loosen and curl up, you may trim the loose edges. Do not remove adhesive strips completely unless your health care provider tells you to do that.  Check your biopsy site every day for signs of infection. Check for: ? More redness, swelling, or pain. ? More fluid or blood. ? Warmth. ? Pus or a bad smell. Activity  Limit activities or movements as told by your health care provider.  Do not drive for 24 hours if you received a medicine to help you relax (sedative). General instructions  Take over-the-counter and prescription medicines only as told by your health care provider.  Do not take baths, swim, or use a hot tub until your health care provider approves. Ask your health care provider if you can take showers.  Keep all follow-up visits as told by your health care  provider. This is important. Contact a health care provider if:  You have more redness, swelling, or pain around your biopsy site.  You have more fluid or blood coming from your biopsy site.  Your biopsy site feels warm to the touch.  You have pus or a bad smell coming from your biopsy site.  You have a fever.  You are light-headed or you feel faint. Get help right away if:  You develop a rash.  You have difficulty breathing.  You have numbness or tingling going down the arm or leg of your biopsy site. This information is not intended to replace advice given to you by your health care provider. Make sure you discuss any questions you have with your health care provider. Document Released: 12/12/2004 Document Revised: 01/23/2016 Document Reviewed: 02/26/2015 Elsevier Interactive Patient Education  [employer].

## 2018-01-01 ENCOUNTER — Encounter (HOSPITAL_COMMUNITY): Payer: Self-pay | Admitting: Neurosurgery

## 2018-01-11 ENCOUNTER — Telehealth: Payer: Self-pay | Admitting: Diagnostic Neuroimaging

## 2018-01-11 DIAGNOSIS — G729 Myopathy, unspecified: Secondary | ICD-10-CM | POA: Diagnosis not present

## 2018-01-11 DIAGNOSIS — R253 Fasciculation: Secondary | ICD-10-CM

## 2018-01-11 DIAGNOSIS — R251 Tremor, unspecified: Secondary | ICD-10-CM

## 2018-01-11 DIAGNOSIS — M6281 Muscle weakness (generalized): Secondary | ICD-10-CM

## 2018-01-11 NOTE — Telephone Encounter (Signed)
Report mentions normal muscle tissue.   I recommend repeat CK level.   -VRP

## 2018-01-11 NOTE — Telephone Encounter (Signed)
Spoke with patient and informed him Dr Leta Baptist reviewed his muscle biopsy report. Advised him it mentions normal muscle tissue, and Dr Leta Baptist would like to repeat his CK lab. Advised no appointment needed. The patient stated he will come in on Thursday for the lab, verbalized understanding, appreciation of call.  Lab ordered placed.

## 2018-01-11 NOTE — Telephone Encounter (Signed)
Pt requesting a call to discuss moving forward after his recent visit at [facility]

## 2018-01-12 DIAGNOSIS — R253 Fasciculation: Secondary | ICD-10-CM | POA: Diagnosis not present

## 2018-01-12 DIAGNOSIS — R251 Tremor, unspecified: Secondary | ICD-10-CM | POA: Diagnosis not present

## 2018-01-12 DIAGNOSIS — M6281 Muscle weakness (generalized): Secondary | ICD-10-CM | POA: Diagnosis not present

## 2018-01-12 NOTE — Addendum Note (Signed)
Addended by: Inis Sizer D on: 01/12/2018 10:36 AM   Modules accepted: Orders

## 2018-01-13 LAB — CK: CK TOTAL: 558 U/L — AB (ref 24–204)

## 2018-01-13 NOTE — Telephone Encounter (Addendum)
Called patient, and he stated he had just seen his lab result. He stated his  CK still high. This RN advised him that Dr Leta Baptist recommends [facility]nd opinion at [facility] or [facility]) vs repeating another biopsy (different location).  The patient stated he would like referral to go to [facility] where he is seen for his kidney disease. This RN advised will let Dr Leta Baptist know, and if he has not gotten a call from [facility] in 1-2 weeks, he should call back to check on referral. He verbalized understanding, appreciation.

## 2018-01-14 NOTE — Addendum Note (Signed)
Addended by: Andrey Spearman R on: 01/14/2018 09:47 AM   Modules accepted: Orders

## 2018-01-14 NOTE — Telephone Encounter (Signed)
Referral placed. -VRP

## 2018-01-17 ENCOUNTER — Telehealth: Payer: Self-pay | Admitting: Diagnostic Neuroimaging

## 2018-01-17 NOTE — Telephone Encounter (Signed)
Patient's will be seen buy Maryellen Pile  [facility] ) November 6 th at 7:45 for 8:00 apt . Patient is on CX list for sooner apt . Marland Kitchen Telephone [phone] - fax [phone] .

## 2018-01-26 ENCOUNTER — Inpatient Hospital Stay: Admit: 2018-01-26 | Payer: Medicare Other | Admitting: Orthopedic Surgery

## 2018-01-26 SURGERY — ARTHROPLASTY, HIP, TOTAL, ANTERIOR APPROACH
Anesthesia: Choice | Site: Hip | Laterality: Right

## 2018-02-11 DIAGNOSIS — R252 Cramp and spasm: Secondary | ICD-10-CM | POA: Diagnosis not present

## 2018-02-11 DIAGNOSIS — Z794 Long term (current) use of insulin: Secondary | ICD-10-CM | POA: Diagnosis not present

## 2018-02-11 DIAGNOSIS — R253 Fasciculation: Secondary | ICD-10-CM | POA: Diagnosis not present

## 2018-02-11 DIAGNOSIS — R29898 Other symptoms and signs involving the musculoskeletal system: Secondary | ICD-10-CM | POA: Diagnosis not present

## 2018-02-11 DIAGNOSIS — E1142 Type 2 diabetes mellitus with diabetic polyneuropathy: Secondary | ICD-10-CM | POA: Diagnosis not present

## 2018-02-11 DIAGNOSIS — M62541 Muscle wasting and atrophy, not elsewhere classified, right hand: Secondary | ICD-10-CM | POA: Diagnosis not present

## 2018-02-11 DIAGNOSIS — R748 Abnormal levels of other serum enzymes: Secondary | ICD-10-CM | POA: Diagnosis not present

## 2018-02-16 DIAGNOSIS — E1165 Type 2 diabetes mellitus with hyperglycemia: Secondary | ICD-10-CM | POA: Diagnosis not present

## 2018-02-16 DIAGNOSIS — E782 Mixed hyperlipidemia: Secondary | ICD-10-CM | POA: Diagnosis not present

## 2018-02-16 DIAGNOSIS — N183 Chronic kidney disease, stage 3 (moderate): Secondary | ICD-10-CM | POA: Diagnosis not present

## 2018-02-16 DIAGNOSIS — I10 Essential (primary) hypertension: Secondary | ICD-10-CM | POA: Diagnosis not present

## 2018-02-16 DIAGNOSIS — I25721 Atherosclerosis of autologous artery coronary artery bypass graft(s) with angina pectoris with documented spasm: Secondary | ICD-10-CM | POA: Diagnosis not present

## 2018-02-22 ENCOUNTER — Encounter: Payer: Self-pay | Admitting: Cardiology

## 2018-02-22 ENCOUNTER — Ambulatory Visit (INDEPENDENT_AMBULATORY_CARE_PROVIDER_SITE_OTHER): Payer: Medicare Other | Admitting: Cardiology

## 2018-02-22 ENCOUNTER — Encounter: Payer: Self-pay | Admitting: *Deleted

## 2018-02-22 VITALS — BP 164/74 | HR 67 | Ht 70.0 in | Wt 209.6 lb

## 2018-02-22 DIAGNOSIS — E782 Mixed hyperlipidemia: Secondary | ICD-10-CM | POA: Diagnosis not present

## 2018-02-22 DIAGNOSIS — I739 Peripheral vascular disease, unspecified: Secondary | ICD-10-CM | POA: Diagnosis not present

## 2018-02-22 DIAGNOSIS — I25118 Atherosclerotic heart disease of native coronary artery with other forms of angina pectoris: Secondary | ICD-10-CM

## 2018-02-22 DIAGNOSIS — I10 Essential (primary) hypertension: Secondary | ICD-10-CM | POA: Diagnosis not present

## 2018-02-22 NOTE — Progress Notes (Signed)
Clinical Summary [name] is a 77 y.o.male seen today for follow up of the following medical problems.  1. Chest pain - several year history of chest pain - occurs with exertion only. Tightness mid to left chest, 7/10. No other associated symptoms. Comes on 4 laps around track, can keep going 6-9 laps. Then pain resolves. Overall stable in severity and frequency.   - 12/2015 lexiscan showed small area of mild ischemia inferior wall, LVEF 50%. Low risk study.  - 02/2016 echo LVEF 55-60%, no WMAs, grade I diastolic dysfunction CAD risk factors: DM2, hyperlipidemia, HTN, former tobacco, father MI 14s    - denies any chest pain. No SOB/DOE - keeping up his workout, crossfitt 2 times per week. Ellipitcal 4 times a week without troubles - compliant with meds  2. CKD III - followed at [facility].   3. Hyperlipidemia - 04/2017 TG 63 TC 109 HDL 31 LDL 66 - he is compliant with statin.   - most recent labs with pcp.   4. HTN - compliant with meds - 02/11/18 appt 133/75  5. DM2 - followed by endocrinology Dr Dorris Fetch   6. PAD - history of tobacco abuse - no evidence of aneurysm by 05/2016 [place], did show some PAD. He denies claudication.  - no recent claudication pain.   7. Preoperative evaluation  being considered for hip replacement. - recent low risk stress test, tolerates greater than 4 METs without cardiopulmonary symptoms.      Past Medical History:  Diagnosis Date  . Arthritis   . Diabetes mellitus, type II ([place])   . Fatty liver   . Hyperlipidemia   . Hypertension   . Kidney disease      No Known Allergies   Current Outpatient Medications  Medication Sig Dispense Refill  . acetaminophen-codeine (TYLENOL #3) 300-30 MG tablet Take 1 tablet by mouth every 6 (six) hours as needed for moderate pain. 30 tablet 0  . amLODipine (NORVASC) 10 MG tablet TAKE 1 TABLET BY MOUTH EVERY DAY 90 tablet 1  . aspirin 81 MG tablet Take 81 mg by mouth daily.    Marland Kitchen  atorvastatin (LIPITOR) 40 MG tablet Take 1 tablet (40 mg total) by mouth daily. (Patient taking differently: Take 40 mg by mouth at bedtime. ) 90 tablet 1  . Cholecalciferol (VITAMIN D3) 2000 UNITS TABS Take 2,000 Units by mouth 2 (two) times daily.     . CONTOUR NEXT TEST test strip USE AS DIRECTED THREE TIMES A DAY. (Patient not taking: Reported on 12/29/2017) 300 each 1  . Insulin Pen Needle (BD PEN NEEDLE NANO U/F) 32G X 4 MM MISC USE AS DIRECTED EVERY MORNING (Patient not taking: Reported on 12/29/2017) 100 each 2  . Insulin Pen Needle (PEN NEEDLES) 31G X 8 MM MISC Use qhs (Patient not taking: Reported on 12/29/2017) 100 each 5  . isosorbide mononitrate (IMDUR) 30 MG 24 hr tablet TAKE 1 TABLET (30 MG TOTAL) BY MOUTH DAILY. 90 tablet 3  . latanoprost (XALATAN) 0.005 % ophthalmic solution Place 1 drop into the left eye at bedtime.    Marland Kitchen losartan-hydrochlorothiazide (HYZAAR) 100-25 MG tablet Take 1 tablet by mouth daily.    . metoprolol succinate (TOPROL XL) 25 MG 24 hr tablet Take 1.5 tablets (37.5 mg total) by mouth 2 (two) times daily. 270 tablet 1  . Omega-3 Fatty Acids (FISH OIL) 1200 MG CAPS Take 1,200 mg by mouth 2 (two) times daily.     . XULTOPHY 100-3.6 UNIT-MG/ML  SOPN INJECT 50 UNITS INTO THE SKIN DAILY WITH BREAKFAST. 15 mL 2   No current facility-administered medications for this visit.      Past Surgical History:  Procedure Laterality Date  . COLONOSCOPY W/ POLYPECTOMY    . EYE SURGERY Bilateral    catarcts with lens  . MUSCLE BIOPSY Left 12/31/2017   Procedure: Left Deltoid Muscle Biopsy;  Surgeon: Ashok Pall, MD;  Location: [place];  Service: Neurosurgery;  Laterality: Left;  Left Deltoid Muscle Biopsy  . OTHER SURGICAL HISTORY     growths removed R buttock1970, RU arm 2001, and R wrist 2009.     No Known Allergies    Family History  Problem Relation Age of Onset  . Diabetes Other   . Heart disease Other   . Hyperlipidemia Other   . Hypertension Other   . Lung  cancer Mother   . Hypertension Mother   . Diabetes Mother   . High Cholesterol Mother   . Heart disease Father   . Hypertension Father   . Diabetes Father   . High Cholesterol Father      Social History [name] that he quit smoking about 31 years ago. He has never used smokeless tobacco. [name] that he does not drink alcohol.   Review of Systems CONSTITUTIONAL: No weight loss, fever, chills, weakness or fatigue.  HEENT: Eyes: No visual loss, blurred vision, double vision or yellow sclerae.No hearing loss, sneezing, congestion, runny nose or sore throat.  SKIN: No rash or itching.  CARDIOVASCULAR: per hpi RESPIRATORY: No shortness of breath, cough or sputum.  GASTROINTESTINAL: No anorexia, nausea, vomiting or diarrhea. No abdominal pain or blood.  GENITOURINARY: No burning on urination, no polyuria NEUROLOGICAL: No headache, dizziness, syncope, paralysis, ataxia, numbness or tingling in the extremities. No change in bowel or bladder control.  MUSCULOSKELETAL: No muscle, back pain, joint pain or stiffness.  LYMPHATICS: No enlarged nodes. No history of splenectomy.  PSYCHIATRIC: No history of depression or anxiety.  ENDOCRINOLOGIC: No reports of sweating, cold or heat intolerance. No polyuria or polydipsia.  Marland Kitchen   Physical Examination Vitals:   02/22/18 1026  BP: (!) 164/74  Pulse: 67  SpO2: 99%   Vitals:   02/22/18 1026  Weight: 209 lb 9.6 oz (95.1 kg)  Height: 5\' 10"  (1.778 m)    Gen: resting comfortably, no acute distress HEENT: no scleral icterus, pupils equal round and reactive, no palptable cervical adenopathy,  CV: RRR, no mr/g, no jvd Resp: Clear to auscultation bilaterally GI: abdomen is soft, non-tender, non-distended, normal bowel sounds, no hepatosplenomegaly MSK: extremities are warm, no edema.  Skin: warm, no rash Neuro:  no focal deficits Psych: appropriate affect   Diagnostic Studies 02/2016 echo Study Conclusions  - Left  ventricle: The cavity size was normal. Wall thickness was normal. Systolic function was normal. The estimated ejection fraction was in the range of 55% to 60%. Wall motion was normal; there were no regional wall motion abnormalities. Doppler parameters are consistent with abnormal left ventricular relaxation (grade 1 diastolic dysfunction). - Aortic valve: Mildly calcified annulus. Trileaflet; normal thickness leaflets. Valve area (VTI): 2.41 cm^2. Valve area (Vmax): 2.35 cm^2. Valve area (Vmean): 2.05 cm^2. - Mitral valve: Mildly calcified annulus. Normal thickness leaflets . - Technically adequate study.  12/2015 MPI -small focus of ischemia inferior wall, LVEF 50%, low risk.   05/2016 AAA [place] No AAA     Assessment and Plan  1. Chest pain/CAD with stable angina - in  setting of low risk nuclear stress, CKD III, and overall controlled symptoms continue medical therapy at this time, no plans for cath. - continues to do well, continue current meds - EKG today SR, no ischemic changes  2. HTN - above goal, manual repaet 145/75. He will submit a bp log in 1 week.    3. Hyperlipidemia -request labs from pcp, request pcp labs  4. PAD - noted on recent AAA [place].  - no symptoms - continue medical therapy.    F/u 6 months    Arnoldo Lenis, M.D.

## 2018-02-22 NOTE — Patient Instructions (Signed)
Your physician wants you to follow-up in: [place] will receive a reminder letter in the mail two months in advance. If you don't receive a letter, please call our office to schedule the follow-up appointment.  Your physician recommends that you continue on your current medications as directed. Please refer to the Current Medication list given to you today.  Your physician has requested that you regularly monitor and record your blood pressure readings at home FOR [facility]. Please use the same machine at the same time of day to check your readings   Thank you for choosing [facility]!!

## 2018-02-23 DIAGNOSIS — I25721 Atherosclerosis of autologous artery coronary artery bypass graft(s) with angina pectoris with documented spasm: Secondary | ICD-10-CM | POA: Diagnosis not present

## 2018-02-23 DIAGNOSIS — Z23 Encounter for immunization: Secondary | ICD-10-CM | POA: Diagnosis not present

## 2018-02-23 DIAGNOSIS — N183 Chronic kidney disease, stage 3 (moderate): Secondary | ICD-10-CM | POA: Diagnosis not present

## 2018-02-23 DIAGNOSIS — Z6829 Body mass index (BMI) 29.0-29.9, adult: Secondary | ICD-10-CM | POA: Diagnosis not present

## 2018-02-23 DIAGNOSIS — E1165 Type 2 diabetes mellitus with hyperglycemia: Secondary | ICD-10-CM | POA: Diagnosis not present

## 2018-02-23 DIAGNOSIS — I10 Essential (primary) hypertension: Secondary | ICD-10-CM | POA: Diagnosis not present

## 2018-02-23 DIAGNOSIS — E782 Mixed hyperlipidemia: Secondary | ICD-10-CM | POA: Diagnosis not present

## 2018-02-23 DIAGNOSIS — I739 Peripheral vascular disease, unspecified: Secondary | ICD-10-CM | POA: Diagnosis not present

## 2018-02-25 ENCOUNTER — Other Ambulatory Visit: Payer: Self-pay | Admitting: Cardiology

## 2018-03-04 ENCOUNTER — Telehealth: Payer: Self-pay | Admitting: Cardiology

## 2018-03-04 DIAGNOSIS — I10 Essential (primary) hypertension: Secondary | ICD-10-CM

## 2018-03-04 MED ORDER — LOSARTAN POTASSIUM 100 MG PO TABS: 100.0000 mg | ORAL_TABLET | Freq: Every day | ORAL | 1 refills | Status: DC

## 2018-03-04 MED ORDER — HYDROCHLOROTHIAZIDE 25 MG PO TABS: 25.0000 mg | ORAL_TABLET | Freq: Every day | ORAL | 1 refills | Status: DC

## 2018-03-04 NOTE — Telephone Encounter (Signed)
Pt 1 week BP log:  date BP HR 9/18 - 120/76   74 9/19 - 144/76   70 9/20 - 159/73   73 9/21 - 150/71   71 9/23 - 135/67   66 9/24 - 168/82   66 9/25 - 143/71   68 9/26 - 136/66   64 9/27 - 146/70   69

## 2018-03-04 NOTE — Telephone Encounter (Signed)
Pt aware and voiced understanding - will mail pt lab orders as requested - medications sent to pharmacy

## 2018-03-04 NOTE — Telephone Encounter (Signed)
Patient is calling to give his BP readings.

## 2018-03-04 NOTE — Telephone Encounter (Signed)
BP on average too high. Have him stop his hyzaar (losartan/HCTZ combination). Start losartan 100mg  by itself, and also start chlorthalidone 25mg  daily. BMET/Mg in 2 weeks. Update [place] on bp's in 2 weeks   Zandra Abts MD

## 2018-03-07 DIAGNOSIS — E782 Mixed hyperlipidemia: Secondary | ICD-10-CM | POA: Diagnosis not present

## 2018-03-07 DIAGNOSIS — N183 Chronic kidney disease, stage 3 (moderate): Secondary | ICD-10-CM | POA: Diagnosis not present

## 2018-03-07 DIAGNOSIS — E1122 Type 2 diabetes mellitus with diabetic chronic kidney disease: Secondary | ICD-10-CM | POA: Diagnosis not present

## 2018-03-07 LAB — LIPID PANEL
Cholesterol: 112 (ref 0–200)
HDL: 38 (ref 35–70)
LDL CALC: 59
TRIGLYCERIDES: 74 (ref 40–160)

## 2018-03-07 LAB — BASIC METABOLIC PANEL
BUN: 26 — AB (ref 4–21)
CREATININE: 1.6 — AB (ref 0.6–1.3)

## 2018-03-07 LAB — HEMOGLOBIN A1C: Hgb A1c MFr Bld: 7 — AB (ref 4.0–6.0)

## 2018-03-08 DIAGNOSIS — Z23 Encounter for immunization: Secondary | ICD-10-CM | POA: Diagnosis not present

## 2018-03-08 DIAGNOSIS — G1221 Amyotrophic lateral sclerosis: Secondary | ICD-10-CM

## 2018-03-08 HISTORY — DX: Amyotrophic lateral sclerosis: G12.21

## 2018-03-09 ENCOUNTER — Other Ambulatory Visit: Payer: Self-pay | Admitting: Cardiology

## 2018-03-14 ENCOUNTER — Encounter: Payer: Self-pay | Admitting: "Endocrinology

## 2018-03-14 ENCOUNTER — Ambulatory Visit (INDEPENDENT_AMBULATORY_CARE_PROVIDER_SITE_OTHER): Payer: Medicare Other | Admitting: "Endocrinology

## 2018-03-14 VITALS — BP 133/83 | HR 72 | Ht 70.0 in | Wt 206.0 lb

## 2018-03-14 DIAGNOSIS — I10 Essential (primary) hypertension: Secondary | ICD-10-CM

## 2018-03-14 DIAGNOSIS — E1122 Type 2 diabetes mellitus with diabetic chronic kidney disease: Secondary | ICD-10-CM | POA: Diagnosis not present

## 2018-03-14 DIAGNOSIS — E782 Mixed hyperlipidemia: Secondary | ICD-10-CM

## 2018-03-14 DIAGNOSIS — N183 Chronic kidney disease, stage 3 unspecified: Secondary | ICD-10-CM

## 2018-03-14 DIAGNOSIS — I25118 Atherosclerotic heart disease of native coronary artery with other forms of angina pectoris: Secondary | ICD-10-CM | POA: Diagnosis not present

## 2018-03-14 MED ORDER — LISINOPRIL 20 MG PO TABS: 20.0000 mg | ORAL_TABLET | Freq: Every day | ORAL | 3 refills | Status: DC

## 2018-03-14 NOTE — Progress Notes (Signed)
Endocrinology follow-up note   Subjective:    Patient ID: John Bishop, male    DOB: 09/16/1940. Patient is here to follow-up for management of type 2 diabetes   Complicated by stage 3 renal insufficiency, lipidemia, and hypertension.  Past Medical History:  Diagnosis Date  . Arthritis   . Diabetes mellitus, type II ([place])   . Fatty liver   . Hyperlipidemia   . Hypertension   . Kidney disease    Past Surgical History:  Procedure Laterality Date  . COLONOSCOPY W/ POLYPECTOMY    . EYE SURGERY Bilateral    catarcts with lens  . MUSCLE BIOPSY Left 12/31/2017   Procedure: Left Deltoid Muscle Biopsy;  Surgeon: Ashok Pall, MD;  Location: [place];  Service: Neurosurgery;  Laterality: Left;  Left Deltoid Muscle Biopsy  . OTHER SURGICAL HISTORY     growths removed R buttock1970, RU arm 2001, and R wrist 2009.   Social History   Socioeconomic History  . Marital status: Married    Spouse name: Not on file  . Number of children: Not on file  . Years of education: Not on file  . Highest education level: Not on file  Occupational History  . Not on file  Social Needs  . Financial resource strain: Not on file  . Food insecurity:    Worry: Not on file    Inability: Not on file  . Transportation needs:    Medical: Not on file    Non-medical: Not on file  Tobacco Use  . Smoking status: Former Smoker    Last attempt to quit: 06/07/1986    Years since quitting: 31.7  . Smokeless tobacco: Never Used  Substance and Sexual Activity  . Alcohol use: No    Alcohol/week: 0.0 standard drinks    Comment: quit 1974  . Drug use: No  . Sexual activity: Not on file  Lifestyle  . Physical activity:    Days per week: Not on file    Minutes per session: Not on file  . Stress: Not on file  Relationships  . Social connections:    Talks on phone: Not on file    Gets together: Not on file    Attends religious service: Not on file    Active member of club or organization: Not on file     Attends meetings of clubs or organizations: Not on file    Relationship status: Not on file  Other Topics Concern  . Not on file  Social History Narrative   Lives home with wife, John Bishop.  Education college.  One child in [place], [place].  Retired- Agricultural engineer.   Outpatient Encounter Medications as of 03/14/2018  Medication Sig  . acetaminophen (TYLENOL) 650 MG CR tablet Take 650 mg by mouth every 8 (eight) hours as needed for pain.  Marland Kitchen amLODipine (NORVASC) 10 MG tablet TAKE 1 TABLET BY MOUTH EVERY DAY  . aspirin 81 MG tablet Take 81 mg by mouth daily.  Marland Kitchen atorvastatin (LIPITOR) 40 MG tablet Take 1 tablet (40 mg total) by mouth daily. (Patient taking differently: Take 40 mg by mouth at bedtime. )  . Cholecalciferol (VITAMIN D3) 2000 UNITS TABS Take 2,000 Units by mouth 2 (two) times daily.   . CONTOUR NEXT TEST test strip USE AS DIRECTED THREE TIMES A DAY.  . hydrochlorothiazide (HYDRODIURIL) 25 MG tablet Take 1 tablet (25 mg total) by mouth daily.  . Insulin Pen Needle (BD PEN NEEDLE NANO U/F) 32G X 4 MM  MISC USE AS DIRECTED EVERY MORNING  . Insulin Pen Needle (PEN NEEDLES) 31G X 8 MM MISC Use qhs  . isosorbide mononitrate (IMDUR) 30 MG 24 hr tablet TAKE 1 TABLET (30 MG TOTAL) BY MOUTH DAILY.  Marland Kitchen latanoprost (XALATAN) 0.005 % ophthalmic solution Place 1 drop into the left eye at bedtime.  Marland Kitchen lisinopril (PRINIVIL,ZESTRIL) 20 MG tablet Take 1 tablet (20 mg total) by mouth daily.  . metoprolol succinate (TOPROL-XL) 25 MG 24 hr tablet TAKE 1.5 TABLETS BY MOUTH 2 TIMES DAILY.  Marland Kitchen Omega-3 Fatty Acids (FISH OIL) 1200 MG CAPS Take 1,200 mg by mouth 2 (two) times daily.   [name] 100-3.6 UNIT-MG/ML SOPN INJECT 50 UNITS INTO THE SKIN DAILY WITH BREAKFAST.  . [DISCONTINUED] losartan (COZAAR) 100 MG tablet Take 1 tablet (100 mg total) by mouth daily.   No facility-administered encounter medications on file as of 03/14/2018.    ALLERGIES: No Known Allergies VACCINATION STATUS:  There is no  immunization history on file for this patient.  Diabetes  He presents for his follow-up diabetic visit. He has type 2 diabetes mellitus. Onset time: He was diagnosed at approximate age of 69 years. His disease course has been stable. There are no hypoglycemic associated symptoms. Pertinent negatives for hypoglycemia include no confusion, headaches, pallor or seizures. Pertinent negatives for diabetes include no chest pain, no fatigue, no polydipsia, no polyphagia, no polyuria and no weakness. There are no hypoglycemic complications. Symptoms are stable. Diabetic complications include nephropathy. Pertinent negatives for diabetic complications include no CVA, heart disease or retinopathy. Risk factors for coronary artery disease include diabetes mellitus, dyslipidemia, male sex and tobacco exposure. Current diabetic treatment includes insulin injections and oral agent (dual therapy). He is compliant with treatment most of the time. His weight is stable. He is following a generally healthy diet. He has not had a previous visit with a dietitian. He participates in exercise intermittently. There is no change in his home blood glucose trend. His breakfast blood glucose range is generally 140-180 mg/dl. His overall blood glucose range is 140-180 mg/dl. An ACE inhibitor/angiotensin II receptor blocker is being taken. Eye exam is current (No retinopathy February 2019).  Hypertension  This is a chronic problem. The current episode started more than 1 year ago. Pertinent negatives include no chest pain, headaches, neck pain, palpitations or shortness of breath. Risk factors for coronary artery disease include diabetes mellitus, dyslipidemia, male gender and smoking/tobacco exposure. Past treatments include angiotensin blockers. There is no history of CVA or retinopathy.  Hyperlipidemia  This is a chronic problem. The current episode started more than 1 year ago. The problem is controlled. Exacerbating diseases include  diabetes. Pertinent negatives include no chest pain, myalgias or shortness of breath. Current antihyperlipidemic treatment includes statins. Risk factors for coronary artery disease include dyslipidemia, diabetes mellitus, hypertension and male sex.     Review of Systems  Constitutional: Negative for fatigue and unexpected weight change.  HENT: Negative for dental problem, mouth sores and trouble swallowing.   Eyes: Negative for visual disturbance.  Respiratory: Negative for cough, choking, chest tightness, shortness of breath and wheezing.   Cardiovascular: Negative for chest pain, palpitations and leg swelling.  Gastrointestinal: Negative for abdominal distention, abdominal pain, constipation, diarrhea, nausea and vomiting.  Endocrine: Negative for polydipsia, polyphagia and polyuria.  Genitourinary: Negative for dysuria, flank pain, hematuria and urgency.  Musculoskeletal: Negative for back pain, gait problem, myalgias and neck pain.  Skin: Negative for pallor, rash and wound.  Neurological: Negative for  seizures, syncope, weakness, numbness and headaches.  Psychiatric/Behavioral: Negative.  Negative for confusion and dysphoric mood.    Objective:    BP 133/83   Pulse 72   Ht 5\' 10"  (1.778 m)   Wt 206 lb (93.4 kg)   BMI 29.56 kg/m   Wt Readings from Last 3 Encounters:  03/14/18 206 lb (93.4 kg)  02/22/18 209 lb 9.6 oz (95.1 kg)  12/31/17 206 lb (93.4 kg)    Physical Exam  Constitutional: He is oriented to person, place, and time. He appears well-developed. He is cooperative. No distress.  HENT:  Head: Normocephalic and atraumatic.  Eyes: EOM are normal.  Neck: Normal range of motion. Neck supple. No tracheal deviation present. No thyromegaly present.  Cardiovascular: Normal rate, S1 normal and S2 normal. Exam reveals no gallop.  No murmur heard. Pulses:      Dorsalis pedis pulses are 1+ on the right side, and 1+ on the left side.       Posterior tibial pulses are 1+ on  the right side, and 1+ on the left side.  Pulmonary/Chest: Effort normal. No respiratory distress. He has no wheezes.  Abdominal: He exhibits no distension. There is no tenderness. There is no guarding and no CVA tenderness.  Musculoskeletal: He exhibits no edema.       Right shoulder: He exhibits no swelling and no deformity.  Neurological: He is alert and oriented to person, place, and time. He has normal strength. No cranial nerve deficit or sensory deficit. Gait normal.  Skin: Skin is warm and dry. No rash noted. No cyanosis. Nails show no clubbing.  Psychiatric: He has a normal mood and affect. His speech is normal and behavior is normal. Judgment normal. Cognition and memory are normal.   CMP     Component Value Date/Time   NA 142 12/31/2017 0718   K 4.4 12/31/2017 0718   CL 112 (H) 12/31/2017 0718   CO2 21 (L) 12/31/2017 0718   GLUCOSE 133 (H) 12/31/2017 0718   BUN 26 (A) 03/07/2018   CREATININE 1.6 (A) 03/07/2018   CREATININE 1.63 (H) 12/31/2017 0718   CALCIUM 9.0 12/31/2017 0718   GFRNONAA 39 (L) 12/31/2017 0718   GFRAA 46 (L) 12/31/2017 0718   Lipid Panel     Component Value Date/Time   CHOL 112 03/07/2018   TRIG 74 03/07/2018   HDL 38 03/07/2018   LDLCALC 59 03/07/2018   Diabetic Labs (most recent): Lab Results  Component Value Date   HGBA1C 7.0 (A) 03/07/2018   HGBA1C 7.0 (H) 12/31/2017   HGBA1C 7.1 09/02/2017    Completed labs from 07/24/2015 to be scanned into his records.  Assessment & Plan:   1. Diabetes mellitus with stage 3 chronic kidney disease ([place])  - Patient has currently controlled asymptomatic type 2 DM since  77 years of age. - He came with controlled fasting blood glucose profile averaging 115 mg/dL, and A1c of 7%.     -His diabetes is complicated by stage 3 CKD ( which is stable GFR between 44-52).   - He remains at a high risk for more acute and chronic complications of diabetes which include CAD, CVA, CKD, retinopathy, and neuropathy.  These are all discussed in detail with the patient.  - I have counseled the patient on diet management and weight loss, by adopting a carbohydrate restricted/protein rich diet.   -  Suggestion is made for him to avoid simple carbohydrates  from his diet including Cakes, Sweet Desserts /  Pastries, Ice Cream, Soda (diet and regular), Sweet Tea, Candies, Chips, Cookies, Store Bought Juices, Alcohol in Excess of  1-2 drinks a day, Artificial Sweeteners, and "Sugar-free" Products. This will help patient to have stable blood glucose profile and potentially avoid unintended weight gain.  - I encouraged the patient to switch to  unprocessed or minimally processed complex starch and increased protein intake (animal or plant source), fruits, and vegetables.  - Patient is advised to stick to a routine mealtimes to eat 3 meals  a day and avoid unnecessary snacks ( to snack only to correct hypoglycemia).   - I have approached patient with the following individualized plan to manage diabetes and patient agrees:   -  He has benefited from simplified treatment with Xultophy. -He is advised to continue Xultophy 50/1.8 subcutaneously daily with breakfast,  associated with strict monitoring of glucose  daily before breakfast and at bedtime. - This product will help for both diabetes and weight control.  -Patient is encouraged to call clinic for blood glucose levels less than 70 or above 200 mg /dl. -Patient is not a candidate for metformin, SGLT2 inhibitors due to CKD- renal function is improving GFR at 52.  - Patient specific target  A1c;  LDL, HDL, Triglycerides, and  Waist Circumference were discussed in detail.  2) BP/HTN: His blood pressure is controlled to target.  He is concerned about losartan recall.  I discussed and prescribed lisinopril 20 mg p.o. daily along with his hydrochlorothiazide.   3) Lipids/HPL: His recent lipid panel showed controlled LDL at 59.  He is advised to continue atorvastatin 40  mg p.o. nightly.     4)  Weight/Diet: CDE Consult is  initiated , exercise, and detailed carbohydrates information provided.  5) Chronic Care/Health Maintenance:  -Patient is on ACEI/ARB and Statin medications and encouraged to continue to follow up with Ophthalmology, nephrology, Podiatrist at least yearly or according to recommendations, and advised to   stay away from smoking. I have recommended yearly flu vaccine and pneumonia vaccination at least every 5 years; moderate intensity exercise for up to 150 minutes weekly; and  sleep for at least 7 hours a day.  - I advised patient to maintain close follow up with PMD  for primary care needs.  - Time spent with the patient: 25 min, of which >50% was spent in reviewing his blood glucose logs , discussing his hypo- and hyper-glycemic episodes, reviewing his current and  previous labs and insulin doses and developing a plan to avoid hypo- and hyper-glycemia. Please refer to Patient Instructions for Blood Glucose Monitoring and Insulin/Medications Dosing Guide"  in media tab for additional information. [name] participated in the discussions, expressed understanding, and voiced agreement with the above plans.  All questions were answered to his satisfaction. he is encouraged to contact clinic should he have any questions or concerns prior to his return visit.  Follow up plan: - Return in about 6 months (around 09/13/2018) for Meter, and Logs.  Glade Lloyd, MD Phone: [phone]  Fax: [phone]   -  This note was partially dictated with voice recognition software. Similar sounding words can be transcribed inadequately or may not  be corrected upon review.  03/14/2018, 1:14 PM

## 2018-03-14 NOTE — Patient Instructions (Signed)

## 2018-03-16 DIAGNOSIS — E1142 Type 2 diabetes mellitus with diabetic polyneuropathy: Secondary | ICD-10-CM | POA: Diagnosis not present

## 2018-03-16 DIAGNOSIS — G5602 Carpal tunnel syndrome, left upper limb: Secondary | ICD-10-CM | POA: Diagnosis not present

## 2018-03-16 DIAGNOSIS — R253 Fasciculation: Secondary | ICD-10-CM | POA: Diagnosis not present

## 2018-03-16 DIAGNOSIS — R29898 Other symptoms and signs involving the musculoskeletal system: Secondary | ICD-10-CM | POA: Diagnosis not present

## 2018-03-21 ENCOUNTER — Other Ambulatory Visit: Payer: Self-pay | Admitting: "Endocrinology

## 2018-03-23 ENCOUNTER — Telehealth: Payer: Self-pay | Admitting: Diagnostic Neuroimaging

## 2018-03-23 NOTE — Telephone Encounter (Signed)
Pt called stating he had his 2nd opinion with [facility] last week, would like a call to discuss the results from that appt. Please advise

## 2018-03-25 NOTE — Telephone Encounter (Signed)
Spoke with patient to discuss. He stated he has not gotten a call with NCS results, andthe physician, Dr Idalia Needle who ordered the NCS test was not present, so Dr Duke Salvia did the test.  Dr Duke Salvia told the patient that she would send NCS results to Dr Idalia Needle. The patient has called Dr Inge Rise office but not received a call back. This RN advised him that per her conversation with Dr Leta Baptist, it is most appropriate for the ordering physician to give him the results.  He stated he has had symptoms since May, and they are getting worse.  He would like to know results. This RN asked if he was active on [facility] patient portal, and if so, advised he also send a message. He is active and checked it this morning. He stated no messages were there regarding his NCS.  This RN stated would let Dr Leta Baptist know and encouraged him to reach out again to [facility]. He verbalized understanding, appreciation of call.

## 2018-03-25 NOTE — Telephone Encounter (Signed)
Pt has called for the results of the 2nd Nerve conduction, please call

## 2018-03-25 NOTE — Telephone Encounter (Signed)
I called patient.  I reviewed preliminary EMG nerve conduction report from [facility].  I advised patient to go over these test results with the ordering and performing physicians for this study.  I did explain that preliminary results show some type of motor nerve condition or disease which will need further work-up.  Patient will try contacting their clinic on Monday for further follow-up.  Penni Bombard, MD 98/92/1194, 17:40 PM Certified in Neurology, Neurophysiology and Neuroimaging  [facility] [address] [place], [place] [address] [phone]

## 2018-03-28 DIAGNOSIS — I10 Essential (primary) hypertension: Secondary | ICD-10-CM | POA: Diagnosis not present

## 2018-04-08 DIAGNOSIS — E1142 Type 2 diabetes mellitus with diabetic polyneuropathy: Secondary | ICD-10-CM | POA: Diagnosis not present

## 2018-04-08 DIAGNOSIS — Z794 Long term (current) use of insulin: Secondary | ICD-10-CM | POA: Diagnosis not present

## 2018-04-08 DIAGNOSIS — N189 Chronic kidney disease, unspecified: Secondary | ICD-10-CM | POA: Diagnosis not present

## 2018-04-08 DIAGNOSIS — G1220 Motor neuron disease, unspecified: Secondary | ICD-10-CM | POA: Diagnosis not present

## 2018-04-08 DIAGNOSIS — E1122 Type 2 diabetes mellitus with diabetic chronic kidney disease: Secondary | ICD-10-CM | POA: Diagnosis not present

## 2018-04-08 DIAGNOSIS — Z79899 Other long term (current) drug therapy: Secondary | ICD-10-CM | POA: Diagnosis not present

## 2018-04-19 DIAGNOSIS — N183 Chronic kidney disease, stage 3 (moderate): Secondary | ICD-10-CM | POA: Diagnosis not present

## 2018-04-19 DIAGNOSIS — D631 Anemia in chronic kidney disease: Secondary | ICD-10-CM | POA: Diagnosis not present

## 2018-04-19 DIAGNOSIS — E1122 Type 2 diabetes mellitus with diabetic chronic kidney disease: Secondary | ICD-10-CM | POA: Diagnosis not present

## 2018-04-19 DIAGNOSIS — G1220 Motor neuron disease, unspecified: Secondary | ICD-10-CM | POA: Diagnosis not present

## 2018-04-19 DIAGNOSIS — I129 Hypertensive chronic kidney disease with stage 1 through stage 4 chronic kidney disease, or unspecified chronic kidney disease: Secondary | ICD-10-CM | POA: Diagnosis not present

## 2018-04-27 DIAGNOSIS — G1221 Amyotrophic lateral sclerosis: Secondary | ICD-10-CM

## 2018-04-27 HISTORY — DX: Amyotrophic lateral sclerosis: G12.21

## 2018-05-07 ENCOUNTER — Other Ambulatory Visit: Payer: Self-pay | Admitting: Cardiology

## 2018-05-12 DIAGNOSIS — H401121 Primary open-angle glaucoma, left eye, mild stage: Secondary | ICD-10-CM | POA: Diagnosis not present

## 2018-05-16 ENCOUNTER — Telehealth: Payer: Self-pay | Admitting: *Deleted

## 2018-05-16 MED ORDER — CHLORTHALIDONE 25 MG PO TABS: 25.0000 mg | ORAL_TABLET | Freq: Every day | ORAL | 1 refills | Status: DC

## 2018-05-16 NOTE — Telephone Encounter (Signed)
Pt did not start chlorthalidone - will send new rx and he will stop HCTZ - lab orders will be mailed to home

## 2018-05-16 NOTE — Telephone Encounter (Signed)
Pt says BP has been running high - denies any symptoms says he feels great but was concerned with reading (did not have dates for these readings - 156/72 146/73 140/68 170/68 144/67 150/80 149/64 HR 65 78 62 68 73 - taking Toprol XL 1.5 tablets bid - hctz 25 mg daily, lisinopril 20 mg daily, amlodipine 10 mg daily

## 2018-05-16 NOTE — Telephone Encounter (Signed)
I see where Dr Dorris Fetch changed his losartan to lisinopril which is fine. From our 9/27 phone note he was to start chlorthalidone 25mg  daily, his list has him on HCTZ instead. Can we verify which he is taking, if takign HCTZ please change to chlorthalidone 25mg  daily and repeat labs in 2 week. Chlorthalidone is in same family as HCTZ but much better at lowering bp   J Alara Daniel MD

## 2018-05-25 ENCOUNTER — Other Ambulatory Visit: Payer: Self-pay | Admitting: "Endocrinology

## 2018-05-25 DIAGNOSIS — R292 Abnormal reflex: Secondary | ICD-10-CM | POA: Diagnosis not present

## 2018-05-25 DIAGNOSIS — R29898 Other symptoms and signs involving the musculoskeletal system: Secondary | ICD-10-CM | POA: Diagnosis not present

## 2018-05-25 DIAGNOSIS — Z79899 Other long term (current) drug therapy: Secondary | ICD-10-CM | POA: Diagnosis not present

## 2018-05-25 DIAGNOSIS — G1220 Motor neuron disease, unspecified: Secondary | ICD-10-CM | POA: Diagnosis not present

## 2018-05-25 DIAGNOSIS — Z7982 Long term (current) use of aspirin: Secondary | ICD-10-CM | POA: Diagnosis not present

## 2018-05-25 DIAGNOSIS — G1225 Progressive spinal muscle atrophy: Secondary | ICD-10-CM | POA: Diagnosis not present

## 2018-06-06 DIAGNOSIS — I10 Essential (primary) hypertension: Secondary | ICD-10-CM | POA: Diagnosis not present

## 2018-06-13 ENCOUNTER — Telehealth: Payer: Self-pay | Admitting: Cardiology

## 2018-06-13 ENCOUNTER — Encounter: Payer: Self-pay | Admitting: *Deleted

## 2018-06-13 NOTE — Telephone Encounter (Signed)
Patient called in regards to recent labs. States that he had them done at [facility].

## 2018-06-13 NOTE — Telephone Encounter (Signed)
Pt aware that we have requested labs from [facility] and when Dr Harl Bowie reviews them we would call with results

## 2018-06-17 ENCOUNTER — Telehealth: Payer: Self-pay | Admitting: *Deleted

## 2018-06-17 NOTE — Telephone Encounter (Signed)
-----   Message from Arnoldo Lenis, MD sent at 06/17/2018  2:56 PM EST ----- Labs look fine, kidney function mildly decreased but stable from his last several checks   J BrancH MD

## 2018-06-17 NOTE — Telephone Encounter (Signed)
LM to return call.

## 2018-06-23 NOTE — Telephone Encounter (Signed)
MyChart message sent after several messages left on pt VM and no return call

## 2018-06-29 DIAGNOSIS — M4802 Spinal stenosis, cervical region: Secondary | ICD-10-CM | POA: Diagnosis not present

## 2018-06-30 DIAGNOSIS — G1220 Motor neuron disease, unspecified: Secondary | ICD-10-CM | POA: Diagnosis not present

## 2018-07-05 ENCOUNTER — Other Ambulatory Visit: Payer: Self-pay | Admitting: Cardiology

## 2018-07-05 ENCOUNTER — Other Ambulatory Visit: Payer: Self-pay | Admitting: "Endocrinology

## 2018-08-03 DIAGNOSIS — E782 Mixed hyperlipidemia: Secondary | ICD-10-CM | POA: Diagnosis not present

## 2018-08-03 DIAGNOSIS — N183 Chronic kidney disease, stage 3 (moderate): Secondary | ICD-10-CM | POA: Diagnosis not present

## 2018-08-03 DIAGNOSIS — Z6828 Body mass index (BMI) 28.0-28.9, adult: Secondary | ICD-10-CM | POA: Diagnosis not present

## 2018-08-03 DIAGNOSIS — E1122 Type 2 diabetes mellitus with diabetic chronic kidney disease: Secondary | ICD-10-CM | POA: Diagnosis not present

## 2018-08-03 DIAGNOSIS — I10 Essential (primary) hypertension: Secondary | ICD-10-CM | POA: Diagnosis not present

## 2018-08-03 DIAGNOSIS — E1165 Type 2 diabetes mellitus with hyperglycemia: Secondary | ICD-10-CM | POA: Diagnosis not present

## 2018-08-03 DIAGNOSIS — Z0001 Encounter for general adult medical examination with abnormal findings: Secondary | ICD-10-CM | POA: Diagnosis not present

## 2018-08-22 DIAGNOSIS — I10 Essential (primary) hypertension: Secondary | ICD-10-CM | POA: Diagnosis not present

## 2018-08-22 DIAGNOSIS — I739 Peripheral vascular disease, unspecified: Secondary | ICD-10-CM | POA: Diagnosis not present

## 2018-08-22 DIAGNOSIS — G1221 Amyotrophic lateral sclerosis: Secondary | ICD-10-CM | POA: Diagnosis not present

## 2018-08-22 DIAGNOSIS — I25721 Atherosclerosis of autologous artery coronary artery bypass graft(s) with angina pectoris with documented spasm: Secondary | ICD-10-CM | POA: Diagnosis not present

## 2018-08-22 DIAGNOSIS — E1122 Type 2 diabetes mellitus with diabetic chronic kidney disease: Secondary | ICD-10-CM | POA: Diagnosis not present

## 2018-08-22 DIAGNOSIS — E782 Mixed hyperlipidemia: Secondary | ICD-10-CM | POA: Diagnosis not present

## 2018-08-22 DIAGNOSIS — Z6828 Body mass index (BMI) 28.0-28.9, adult: Secondary | ICD-10-CM | POA: Diagnosis not present

## 2018-08-22 DIAGNOSIS — N183 Chronic kidney disease, stage 3 (moderate): Secondary | ICD-10-CM | POA: Diagnosis not present

## 2018-08-24 ENCOUNTER — Ambulatory Visit: Payer: Medicare Other | Admitting: Cardiology

## 2018-09-05 DIAGNOSIS — E1122 Type 2 diabetes mellitus with diabetic chronic kidney disease: Secondary | ICD-10-CM | POA: Diagnosis not present

## 2018-09-05 DIAGNOSIS — N183 Chronic kidney disease, stage 3 (moderate): Secondary | ICD-10-CM | POA: Diagnosis not present

## 2018-09-05 LAB — BASIC METABOLIC PANEL
BUN: 32 — AB (ref 4–21)
Creatinine: 1.8 — AB (ref 0.6–1.3)

## 2018-09-05 LAB — HEMOGLOBIN A1C: Hemoglobin A1C: 7

## 2018-09-13 ENCOUNTER — Encounter: Payer: Self-pay | Admitting: "Endocrinology

## 2018-09-13 ENCOUNTER — Ambulatory Visit (INDEPENDENT_AMBULATORY_CARE_PROVIDER_SITE_OTHER): Payer: Medicare Other | Admitting: "Endocrinology

## 2018-09-13 ENCOUNTER — Other Ambulatory Visit: Payer: Self-pay

## 2018-09-13 DIAGNOSIS — E782 Mixed hyperlipidemia: Secondary | ICD-10-CM | POA: Diagnosis not present

## 2018-09-13 DIAGNOSIS — E1122 Type 2 diabetes mellitus with diabetic chronic kidney disease: Secondary | ICD-10-CM | POA: Diagnosis not present

## 2018-09-13 DIAGNOSIS — N183 Chronic kidney disease, stage 3 unspecified: Secondary | ICD-10-CM

## 2018-09-13 NOTE — Progress Notes (Signed)
Endocrinology follow-up note   Subjective:    Patient ID: John Bishop, male    DOB: [birth year]. Patient is engaged in telephone visit for the management of type 2 diabetes complicated by stage 3-4 renal insufficiency, and hyperlipidemia.     Past Medical History:  Diagnosis Date  . Arthritis   . Diabetes mellitus, type II ([place])   . Fatty liver   . Hyperlipidemia   . Hypertension   . Kidney disease    Past Surgical History:  Procedure Laterality Date  . COLONOSCOPY W/ POLYPECTOMY    . EYE SURGERY Bilateral    catarcts with lens  . MUSCLE BIOPSY Left 12/31/2017   Procedure: Left Deltoid Muscle Biopsy;  Surgeon: Ashok Pall, MD;  Location: [place];  Service: Neurosurgery;  Laterality: Left;  Left Deltoid Muscle Biopsy  . OTHER SURGICAL HISTORY     growths removed R buttock1970, RU arm 2001, and R wrist 2009.   Social History   Socioeconomic History  . Marital status: Married    Spouse name: Not on file  . Number of children: Not on file  . Years of education: Not on file  . Highest education level: Not on file  Occupational History  . Not on file  Social Needs  . Financial resource strain: Not on file  . Food insecurity:    Worry: Not on file    Inability: Not on file  . Transportation needs:    Medical: Not on file    Non-medical: Not on file  Tobacco Use  . Smoking status: Former Smoker    Last attempt to quit: 06/07/1986    Years since quitting: 32.2  . Smokeless tobacco: Never Used  Substance and Sexual Activity  . Alcohol use: No    Alcohol/week: 0.0 standard drinks    Comment: quit 1974  . Drug use: No  . Sexual activity: Not on file  Lifestyle  . Physical activity:    Days per week: Not on file    Minutes per session: Not on file  . Stress: Not on file  Relationships  . Social connections:    Talks on phone: Not on file    Gets together: Not on file    Attends religious service: Not on file    Active member of club or organization: Not on  file    Attends meetings of clubs or organizations: Not on file    Relationship status: Not on file  Other Topics Concern  . Not on file  Social History Narrative   Lives home with wife, [name].  Education college.  One child in [place], [place].  Retired- Agricultural engineer.   Outpatient Encounter Medications as of 09/13/2018  Medication Sig  . acetaminophen (TYLENOL) 650 MG CR tablet Take 650 mg by mouth every 8 (eight) hours as needed for pain.  Marland Kitchen amLODipine (NORVASC) 10 MG tablet TAKE 1 TABLET BY MOUTH EVERY DAY  . aspirin 81 MG tablet Take 81 mg by mouth daily.  Marland Kitchen atorvastatin (LIPITOR) 40 MG tablet TAKE 1 TABLET BY MOUTH EVERY DAY  . chlorthalidone (HYGROTON) 25 MG tablet Take 1 tablet (25 mg total) by mouth daily.  . Cholecalciferol (VITAMIN D3) 2000 UNITS TABS Take 2,000 Units by mouth 2 (two) times daily.   . CONTOUR NEXT TEST test strip USE AS DIRECTED THREE TIMES A DAY.  Marland Kitchen Insulin Pen Needle (PEN NEEDLES) 31G X 8 MM MISC Use qhs  . Insulin Pen Needle 32G X 4 MM MISC USE  AS DIRECTED EVERY MORNING  . isosorbide mononitrate (IMDUR) 30 MG 24 hr tablet TAKE 1 TABLET (30 MG TOTAL) BY MOUTH DAILY.  Marland Kitchen latanoprost (XALATAN) 0.005 % ophthalmic solution Place 1 drop into the left eye at bedtime.  Marland Kitchen lisinopril (PRINIVIL,ZESTRIL) 20 MG tablet Take 1 tablet (20 mg total) by mouth daily.  . metoprolol succinate (TOPROL-XL) 25 MG 24 hr tablet TAKE 1.5 TABLETS BY MOUTH 2 TIMES DAILY.  Marland Kitchen Omega-3 Fatty Acids (FISH OIL) 1200 MG CAPS Take 1,200 mg by mouth 2 (two) times daily.   [name] 100-3.6 UNIT-MG/ML SOPN INJECT 50 UNITS SUBCUTANEOUSLY EVERY DAY WITH BREAKFAST   No facility-administered encounter medications on file as of 09/13/2018.    ALLERGIES: No Known Allergies VACCINATION STATUS:  There is no immunization history on file for this patient.  Diabetes  He presents for his follow-up diabetic visit. He has type 2 diabetes mellitus. Onset time: He was diagnosed at approximate age of 71  years. His disease course has been stable. There are no hypoglycemic associated symptoms. Pertinent negatives for hypoglycemia include no confusion, pallor or seizures. Pertinent negatives for diabetes include no fatigue, no polydipsia, no polyphagia, no polyuria and no weakness. There are no hypoglycemic complications. Symptoms are stable. Diabetic complications include nephropathy. Pertinent negatives for diabetic complications include no heart disease. Risk factors for coronary artery disease include diabetes mellitus, dyslipidemia, male sex and tobacco exposure. Current diabetic treatment includes insulin injections and oral agent (dual therapy). He is compliant with treatment most of the time. His weight is stable. He is following a generally healthy diet. He has not had a previous visit with a dietitian. He participates in exercise intermittently. There is no change in his home blood glucose trend. His breakfast blood glucose range is generally 140-180 mg/dl. His overall blood glucose range is 140-180 mg/dl. An ACE inhibitor/angiotensin II receptor blocker is being taken. Eye exam is current (No retinopathy February 2019).  Hyperlipidemia  This is a chronic problem. The current episode started more than 1 year ago. The problem is controlled. Exacerbating diseases include diabetes. Pertinent negatives include no myalgias. Current antihyperlipidemic treatment includes statins. Risk factors for coronary artery disease include dyslipidemia, diabetes mellitus, hypertension and male sex.    Objective:    There were no vitals taken for this visit.  Wt Readings from Last 3 Encounters:  03/14/18 206 lb (93.4 kg)  02/22/18 209 lb 9.6 oz (95.1 kg)  12/31/17 206 lb (93.4 kg)    CMP     Component Value Date/Time   NA 142 12/31/2017 0718   K 4.4 12/31/2017 0718   CL 112 (H) 12/31/2017 0718   CO2 21 (L) 12/31/2017 0718   GLUCOSE 133 (H) 12/31/2017 0718   BUN 32 (A) 09/05/2018   CREATININE 1.8 (A)  09/05/2018   CREATININE 1.63 (H) 12/31/2017 0718   CALCIUM 9.0 12/31/2017 0718   GFRNONAA 39 (L) 12/31/2017 0718   GFRAA 46 (L) 12/31/2017 0718   Lipid Panel     Component Value Date/Time   CHOL 112 03/07/2018   TRIG 74 03/07/2018   HDL 38 03/07/2018   LDLCALC 59 03/07/2018   Diabetic Labs (most recent): Lab Results  Component Value Date   HGBA1C 7 09/05/2018   HGBA1C 7.0 (A) 03/07/2018   HGBA1C 7.0 (H) 12/31/2017    Completed labs from 07/24/2015 to be scanned into his records.  Assessment & Plan:   1. Diabetes mellitus with stage 3 chronic kidney disease ([place])  - Patient has currently  controlled asymptomatic type 2 DM since  78 years of age. - He reported controlled glycemic profile and A1c of 7% from September 05, 2018.     -His diabetes is complicated by stage 3 CKD ( which is stable GFR between 44-52).   - He remains at a high risk for more acute and chronic complications of diabetes which include CAD, CVA, CKD, retinopathy, and neuropathy. These are all discussed in detail with the patient.  - I have counseled the patient on diet management and weight loss, by adopting a carbohydrate restricted/protein rich diet.  -  Suggestion is made for him to avoid simple carbohydrates  from his diet including Cakes, Sweet Desserts / Pastries, Ice Cream, Soda (diet and regular), Sweet Tea, Candies, Chips, Cookies, Store Bought Juices, Alcohol in Excess of  1-2 drinks a day, Artificial Sweeteners, and "Sugar-free" Products. This will help patient to have stable blood glucose profile and potentially avoid unintended weight gain.   - I encouraged the patient to switch to  unprocessed or minimally processed complex starch and increased protein intake (animal or plant source), fruits, and vegetables.  - Patient is advised to stick to a routine mealtimes to eat 3 meals  a day and avoid unnecessary snacks ( to snack only to correct hypoglycemia).   - I have approached patient with the  following individualized plan to manage diabetes and patient agrees:   -  He has benefited from simplified treatment with Xultophy. -He is advised to continue Xultophy  50/1.8 subcutaneously daily with breakfast,  associated with strict monitoring of glucose  daily before breakfast and at bedtime. - This product will help for both diabetes and weight control.  -He is encouraged  to call clinic for blood glucose levels less than 70 or above 200 mg /dl. -Patient is not a candidate for metformin, SGLT2 inhibitors due to CKD- renal function is improving GFR at 52.   2) Lipids/HPL: His recent lipid panel showed controlled LDL at 59.  He is advised to continue atorvastatin 40 mg p.o. nightly.      - I advised patient to maintain close follow up with PMD  for primary care needs. - Patient Care Time Today:  25 min, of which >50% was spent in reviewing his  current and  previous labs/studies, previous treatments, and medications doses and developing a plan for long-term care based on the latest recommendations for standards of care.  [name] participated in the discussions, expressed understanding, and voiced agreement with the above plans.  All questions were answered to his satisfaction. he is encouraged to contact clinic should he have any questions or concerns prior to his return visit.   Follow up plan: - Return in about 4 months (around 01/13/2019).  Glade Lloyd, MD Phone: [phone]  Fax: [phone]   -  This note was partially dictated with voice recognition software. Similar sounding words can be transcribed inadequately or may not  be corrected upon review.  09/13/2018, 9:20 AM

## 2018-09-14 MED ORDER — INSULIN DEGLUDEC-LIRAGLUTIDE 100-3.6 UNIT-MG/ML ~~LOC~~ SOPN: 50.0000 [IU] | PEN_INJECTOR | Freq: Every day | SUBCUTANEOUS | 2 refills | Status: DC

## 2018-09-20 DIAGNOSIS — N183 Chronic kidney disease, stage 3 (moderate): Secondary | ICD-10-CM | POA: Diagnosis not present

## 2018-09-20 DIAGNOSIS — E349 Endocrine disorder, unspecified: Secondary | ICD-10-CM | POA: Diagnosis not present

## 2018-09-22 ENCOUNTER — Other Ambulatory Visit: Payer: Self-pay | Admitting: "Endocrinology

## 2018-09-23 DIAGNOSIS — N189 Chronic kidney disease, unspecified: Secondary | ICD-10-CM | POA: Diagnosis not present

## 2018-10-03 DIAGNOSIS — N189 Chronic kidney disease, unspecified: Secondary | ICD-10-CM | POA: Diagnosis not present

## 2018-10-18 DIAGNOSIS — D649 Anemia, unspecified: Secondary | ICD-10-CM | POA: Diagnosis not present

## 2018-10-18 DIAGNOSIS — I129 Hypertensive chronic kidney disease with stage 1 through stage 4 chronic kidney disease, or unspecified chronic kidney disease: Secondary | ICD-10-CM | POA: Diagnosis not present

## 2018-10-18 DIAGNOSIS — D631 Anemia in chronic kidney disease: Secondary | ICD-10-CM | POA: Diagnosis not present

## 2018-10-18 DIAGNOSIS — N183 Chronic kidney disease, stage 3 (moderate): Secondary | ICD-10-CM | POA: Diagnosis not present

## 2018-10-18 DIAGNOSIS — E1122 Type 2 diabetes mellitus with diabetic chronic kidney disease: Secondary | ICD-10-CM | POA: Diagnosis not present

## 2018-10-18 DIAGNOSIS — E1121 Type 2 diabetes mellitus with diabetic nephropathy: Secondary | ICD-10-CM | POA: Diagnosis not present

## 2018-10-20 ENCOUNTER — Ambulatory Visit: Payer: Medicare Other | Admitting: Cardiology

## 2018-10-20 DIAGNOSIS — G1221 Amyotrophic lateral sclerosis: Secondary | ICD-10-CM | POA: Diagnosis not present

## 2018-10-20 DIAGNOSIS — Z6829 Body mass index (BMI) 29.0-29.9, adult: Secondary | ICD-10-CM | POA: Diagnosis not present

## 2018-10-20 DIAGNOSIS — M7501 Adhesive capsulitis of right shoulder: Secondary | ICD-10-CM | POA: Diagnosis not present

## 2018-10-20 DIAGNOSIS — E1165 Type 2 diabetes mellitus with hyperglycemia: Secondary | ICD-10-CM | POA: Diagnosis not present

## 2018-10-24 DIAGNOSIS — M7501 Adhesive capsulitis of right shoulder: Secondary | ICD-10-CM | POA: Diagnosis not present

## 2018-10-24 DIAGNOSIS — M25511 Pain in right shoulder: Secondary | ICD-10-CM | POA: Diagnosis not present

## 2018-10-27 DIAGNOSIS — M1611 Unilateral primary osteoarthritis, right hip: Secondary | ICD-10-CM | POA: Diagnosis not present

## 2018-10-27 DIAGNOSIS — G1221 Amyotrophic lateral sclerosis: Secondary | ICD-10-CM | POA: Diagnosis not present

## 2018-10-27 DIAGNOSIS — Z6828 Body mass index (BMI) 28.0-28.9, adult: Secondary | ICD-10-CM | POA: Diagnosis not present

## 2018-10-27 DIAGNOSIS — I739 Peripheral vascular disease, unspecified: Secondary | ICD-10-CM | POA: Diagnosis not present

## 2018-10-27 DIAGNOSIS — I25721 Atherosclerosis of autologous artery coronary artery bypass graft(s) with angina pectoris with documented spasm: Secondary | ICD-10-CM | POA: Diagnosis not present

## 2018-10-27 DIAGNOSIS — E782 Mixed hyperlipidemia: Secondary | ICD-10-CM | POA: Diagnosis not present

## 2018-10-27 DIAGNOSIS — E1122 Type 2 diabetes mellitus with diabetic chronic kidney disease: Secondary | ICD-10-CM | POA: Diagnosis not present

## 2018-10-28 DIAGNOSIS — N189 Chronic kidney disease, unspecified: Secondary | ICD-10-CM | POA: Diagnosis not present

## 2018-10-28 DIAGNOSIS — M25511 Pain in right shoulder: Secondary | ICD-10-CM | POA: Diagnosis not present

## 2018-10-28 DIAGNOSIS — M7501 Adhesive capsulitis of right shoulder: Secondary | ICD-10-CM | POA: Diagnosis not present

## 2018-11-01 DIAGNOSIS — M7501 Adhesive capsulitis of right shoulder: Secondary | ICD-10-CM | POA: Diagnosis not present

## 2018-11-01 DIAGNOSIS — M25511 Pain in right shoulder: Secondary | ICD-10-CM | POA: Diagnosis not present

## 2018-11-03 DIAGNOSIS — M25511 Pain in right shoulder: Secondary | ICD-10-CM | POA: Diagnosis not present

## 2018-11-03 DIAGNOSIS — M7501 Adhesive capsulitis of right shoulder: Secondary | ICD-10-CM | POA: Diagnosis not present

## 2018-11-04 DIAGNOSIS — H401121 Primary open-angle glaucoma, left eye, mild stage: Secondary | ICD-10-CM | POA: Diagnosis not present

## 2018-11-04 DIAGNOSIS — E1165 Type 2 diabetes mellitus with hyperglycemia: Secondary | ICD-10-CM | POA: Diagnosis not present

## 2018-11-08 DIAGNOSIS — M25511 Pain in right shoulder: Secondary | ICD-10-CM | POA: Diagnosis not present

## 2018-11-08 DIAGNOSIS — M7501 Adhesive capsulitis of right shoulder: Secondary | ICD-10-CM | POA: Diagnosis not present

## 2018-11-09 DIAGNOSIS — R531 Weakness: Secondary | ICD-10-CM | POA: Diagnosis not present

## 2018-11-09 DIAGNOSIS — G1220 Motor neuron disease, unspecified: Secondary | ICD-10-CM | POA: Diagnosis not present

## 2018-11-10 DIAGNOSIS — M25511 Pain in right shoulder: Secondary | ICD-10-CM | POA: Diagnosis not present

## 2018-11-10 DIAGNOSIS — M7501 Adhesive capsulitis of right shoulder: Secondary | ICD-10-CM | POA: Diagnosis not present

## 2018-11-14 ENCOUNTER — Telehealth: Payer: Self-pay | Admitting: Cardiology

## 2018-11-14 NOTE — Telephone Encounter (Signed)
° °  COVID-19 Pre-Screening Questions: ° °• Do you currently have a fever?NO ° ° °• Have you recently travelled on a cruise, internationally, or to [place], [place], [place], [place], [place], or [place], [place] ([place]) ? NO °•  °• Have you been in contact with someone that is currently pending confirmation of Covid19 testing or has been confirmed to have the Covid19 virus?  NO °•  °Are you currently experiencing fatigue or cough? NO ° ° °   ° ° ° ° °

## 2018-11-15 ENCOUNTER — Encounter: Payer: Self-pay | Admitting: *Deleted

## 2018-11-15 ENCOUNTER — Ambulatory Visit (INDEPENDENT_AMBULATORY_CARE_PROVIDER_SITE_OTHER): Payer: Medicare Other | Admitting: Cardiology

## 2018-11-15 ENCOUNTER — Encounter: Payer: Self-pay | Admitting: Cardiology

## 2018-11-15 VITALS — BP 160/75 | HR 76 | Temp 98.3°F | Ht 70.0 in | Wt 206.0 lb

## 2018-11-15 DIAGNOSIS — Z01810 Encounter for preprocedural cardiovascular examination: Secondary | ICD-10-CM

## 2018-11-15 DIAGNOSIS — I10 Essential (primary) hypertension: Secondary | ICD-10-CM | POA: Diagnosis not present

## 2018-11-15 DIAGNOSIS — I25118 Atherosclerotic heart disease of native coronary artery with other forms of angina pectoris: Secondary | ICD-10-CM

## 2018-11-15 DIAGNOSIS — E782 Mixed hyperlipidemia: Secondary | ICD-10-CM

## 2018-11-15 DIAGNOSIS — M25511 Pain in right shoulder: Secondary | ICD-10-CM | POA: Diagnosis not present

## 2018-11-15 DIAGNOSIS — M7501 Adhesive capsulitis of right shoulder: Secondary | ICD-10-CM | POA: Diagnosis not present

## 2018-11-15 NOTE — Patient Instructions (Signed)

## 2018-11-15 NOTE — Progress Notes (Signed)
Clinical Summary John Bishop is a 78 y.o.male  seen today for follow up of the following medical problems.  1. Chest pain - several year history of chest pain - occurs with exertion only. Tightness mid to left chest, 7/10. No other associated symptoms. Comes on 4 laps around track, can keep going 6-9 laps. Then pain resolves. Overall stable in severity and frequency.   - 12/2015 lexiscan showed small area of mild ischemia inferior wall, LVEF 50%. Low risk study.  - 02/2016 echo LVEF 55-60%, no WMAs, grade I diastolic dysfunction CAD risk factors: DM2, hyperlipidemia, HTN, former tobacco, father MI 73s    - no recent chest pain. No SOB or DOE - limited due to neurological disease/weakness.  - does light yard work. Walks up stairs regularly 1 flight at home multiple times per day.   2. CKD III - followed at [facility].  - on lasix, some issues with LE edema. Nephrology stopped chlorthalidone.   3. Hyperlipidemia -04/2017 TG 63 TC 109 HDL 31 LDL 66 - he is compliant with statin.   - most recent labs with pcp.   4. HTN  - compliant with meds - home bp's 130s/60s, checks 2-3 times a week  5. DM2 - followed by endocrinology John Bishop   6.PAD - history of tobacco abuse - no evidence of aneurysm by 05/2016 [place], did show some PAD. He denies claudication.  - no recent claudication pain.   7. Preoperative evaluation  being considered for hip replacement. - recent low risk stress test, tolerates greater than 4 METs without cardiopulmonary symptoms.    Past Medical History:  Diagnosis Date  . Arthritis   . Diabetes mellitus, type II ([place])   . Fatty liver   . Hyperlipidemia   . Hypertension   . Kidney disease      No Known Allergies   Current Outpatient Medications  Medication Sig Dispense Refill  . acetaminophen (TYLENOL) 650 MG CR tablet Take 650 mg by mouth every 8 (eight) hours as needed for pain.    Marland Kitchen amLODipine (NORVASC) 10 MG tablet TAKE 1  TABLET BY MOUTH EVERY DAY 90 tablet 3  . aspirin 81 MG tablet Take 81 mg by mouth daily.    Marland Kitchen atorvastatin (LIPITOR) 40 MG tablet TAKE 1 TABLET BY MOUTH EVERY DAY 90 tablet 1  . Cholecalciferol (VITAMIN D3) 2000 UNITS TABS Take 2,000 Units by mouth 2 (two) times daily.     . CONTOUR NEXT TEST test strip USE AS DIRECTED THREE TIMES A DAY 100 each 1  . furosemide (LASIX) 20 MG tablet Take 20 mg by mouth daily.    . Insulin Degludec-Liraglutide (XULTOPHY) 100-3.6 UNIT-MG/ML SOPN Inject 50 Units into the skin daily with breakfast. 5 pen 2  . Insulin Pen Needle (PEN NEEDLES) 31G X 8 MM MISC Use qhs 100 each 5  . Insulin Pen Needle 32G X 4 MM MISC USE AS DIRECTED EVERY MORNING 100 each 2  . latanoprost (XALATAN) 0.005 % ophthalmic solution Place 1 drop into the left eye at bedtime.    Marland Kitchen lisinopril (PRINIVIL,ZESTRIL) 20 MG tablet Take 1 tablet (20 mg total) by mouth daily. 90 tablet 3  . metoprolol succinate (TOPROL-XL) 25 MG 24 hr tablet TAKE 1.5 TABLETS BY MOUTH 2 TIMES DAILY. 270 tablet 2  . Omega-3 Fatty Acids (FISH OIL) 1200 MG CAPS Take 1,200 mg by mouth 2 (two) times daily.     . sodium bicarbonate 650 MG tablet Take  650 mg by mouth 3 (three) times daily.    . isosorbide mononitrate (IMDUR) 30 MG 24 hr tablet Take 1 tablet (30 mg total) by mouth daily. 90 tablet 1   No current facility-administered medications for this visit.      Past Surgical History:  Procedure Laterality Date  . COLONOSCOPY W/ POLYPECTOMY    . EYE SURGERY Bilateral    catarcts with lens  . MUSCLE BIOPSY Left 12/31/2017   Procedure: Left Deltoid Muscle Biopsy;  Surgeon: Ashok Pall, MD;  Location: [place];  Service: Neurosurgery;  Laterality: Left;  Left Deltoid Muscle Biopsy  . OTHER SURGICAL HISTORY     growths removed R buttock1970, RU arm 2001, and R wrist 2009.     No Known Allergies    Family History  Problem Relation Age of Onset  . Diabetes Other   . Heart disease Other   . Hyperlipidemia Other   .  Hypertension Other   . Lung cancer Mother   . Hypertension Mother   . Diabetes Mother   . High Cholesterol Mother   . Heart disease Father   . Hypertension Father   . Diabetes Father   . High Cholesterol Father      Social History Mr. Erhardt reports that he quit smoking about 32 years ago. He has never used smokeless tobacco. Mr. Lepage reports no history of alcohol use.   Review of Systems CONSTITUTIONAL: No weight loss, fever, chills, weakness or fatigue.  HEENT: Eyes: No visual loss, blurred vision, double vision or yellow sclerae.No hearing loss, sneezing, congestion, runny nose or sore throat.  SKIN: No rash or itching.  CARDIOVASCULAR: per hpi RESPIRATORY: No shortness of breath, cough or sputum.  GASTROINTESTINAL: No anorexia, nausea, vomiting or diarrhea. No abdominal pain or blood.  GENITOURINARY: No burning on urination, no polyuria NEUROLOGICAL: No headache, dizziness, syncope, paralysis, ataxia, numbness or tingling in the extremities. No change in bowel or bladder control.  MUSCULOSKELETAL: No muscle, back pain, joint pain or stiffness.  LYMPHATICS: No enlarged nodes. No history of splenectomy.  PSYCHIATRIC: No history of depression or anxiety.  ENDOCRINOLOGIC: No reports of sweating, cold or heat intolerance. No polyuria or polydipsia.  Marland Kitchen   Physical Examination Vitals:   11/15/18 1538  BP: (!) 160/75  Pulse: 76  Temp: 98.3 F (36.8 C)  SpO2: 97%   Filed Weights   11/15/18 1538  Weight: 206 lb (93.4 kg)    Gen: resting comfortably, no acute distress HEENT: no scleral icterus, pupils equal round and reactive, no palptable cervical adenopathy,  CV: RRR, no m/r/g, no jvd Resp: Clear to auscultation bilaterally GI: abdomen is soft, non-tender, non-distended, normal bowel sounds, no hepatosplenomegaly MSK: extremities are warm, no edema.  Skin: warm, no rash Neuro:  no focal deficits Psych: appropriate affect   Diagnostic Studies     Assessment and  Plan  1. Chest pain/CAD with stable angina -no recent symptoms, continue current meds  2. HTN - manual recheck at goal,continue current meds   3. Hyperlipidemia -continue statin, request pcp labs  4. Preoperative evaluation - ok to proceed with surgery from cardiac standpoitn      Arnoldo Lenis, M.D., F.A.C.C.

## 2018-11-17 DIAGNOSIS — M7501 Adhesive capsulitis of right shoulder: Secondary | ICD-10-CM | POA: Diagnosis not present

## 2018-11-17 DIAGNOSIS — M25511 Pain in right shoulder: Secondary | ICD-10-CM | POA: Diagnosis not present

## 2018-11-21 DIAGNOSIS — E1165 Type 2 diabetes mellitus with hyperglycemia: Secondary | ICD-10-CM | POA: Diagnosis not present

## 2018-11-21 DIAGNOSIS — I739 Peripheral vascular disease, unspecified: Secondary | ICD-10-CM | POA: Diagnosis not present

## 2018-11-21 DIAGNOSIS — E782 Mixed hyperlipidemia: Secondary | ICD-10-CM | POA: Diagnosis not present

## 2018-11-21 DIAGNOSIS — G1221 Amyotrophic lateral sclerosis: Secondary | ICD-10-CM | POA: Diagnosis not present

## 2018-11-21 DIAGNOSIS — Z1389 Encounter for screening for other disorder: Secondary | ICD-10-CM | POA: Diagnosis not present

## 2018-11-21 DIAGNOSIS — E1122 Type 2 diabetes mellitus with diabetic chronic kidney disease: Secondary | ICD-10-CM | POA: Diagnosis not present

## 2018-11-21 DIAGNOSIS — I25721 Atherosclerosis of autologous artery coronary artery bypass graft(s) with angina pectoris with documented spasm: Secondary | ICD-10-CM | POA: Diagnosis not present

## 2018-11-21 DIAGNOSIS — Z6828 Body mass index (BMI) 28.0-28.9, adult: Secondary | ICD-10-CM | POA: Diagnosis not present

## 2018-11-22 DIAGNOSIS — M25511 Pain in right shoulder: Secondary | ICD-10-CM | POA: Diagnosis not present

## 2018-11-22 DIAGNOSIS — M7501 Adhesive capsulitis of right shoulder: Secondary | ICD-10-CM | POA: Diagnosis not present

## 2018-11-24 ENCOUNTER — Other Ambulatory Visit: Payer: Self-pay | Admitting: Cardiology

## 2018-11-24 DIAGNOSIS — M25511 Pain in right shoulder: Secondary | ICD-10-CM | POA: Diagnosis not present

## 2018-11-24 DIAGNOSIS — M7501 Adhesive capsulitis of right shoulder: Secondary | ICD-10-CM | POA: Diagnosis not present

## 2018-11-24 MED ORDER — ISOSORBIDE MONONITRATE ER 30 MG PO TB24: 30.0000 mg | ORAL_TABLET | Freq: Every day | ORAL | 1 refills | Status: DC

## 2018-11-24 NOTE — Telephone Encounter (Signed)
Medication sent to pharmacy  

## 2018-11-24 NOTE — Telephone Encounter (Signed)
° ° °  1. Which medications need to be refilled? (please list name of each medication and dose if known)  isosorbide mononitrate (IMDUR) 30 MG 24 hr tablet  2. Which pharmacy/location (including street and city if local pharmacy) is medication to be sent to?  [facility]   3. Do they need a 30 day or 90 day supply?

## 2018-11-28 NOTE — H&P (Signed)
TOTAL HIP ADMISSION H&P  Patient is admitted for right total hip arthroplasty.  Subjective:  Chief Complaint: right hip pain  HPI: John Bishop, 78 y.o. male, has a history of pain and functional disability in the right hip(s) due to arthritis and patient has failed non-surgical conservative treatments for greater than 12 weeks to include corticosteriod injections, supervised PT with diminished ADL's post treatment and activity modification.  Onset of symptoms was gradual starting 6 years ago with gradually worsening course since that time.The patient noted no past surgery on the right hip(s).  Patient currently rates pain in the right hip at 10 out of 10 with activity. Patient has night pain, worsening of pain with activity and weight bearing and pain that interfers with activities of daily living. Patient has evidence of severe bone-on-bone arthritis with large osteophyte formation by imaging studies. This condition presents safety issues increasing the risk of falls. There is no current active infection.  Patient Active Problem List   Diagnosis Date Noted  . Diabetes mellitus with stage 3 chronic kidney disease ([place]) 05/13/2015  . Essential hypertension, benign 05/13/2015  . Mixed hyperlipidemia 05/13/2015   Past Medical History:  Diagnosis Date  . Arthritis   . Diabetes mellitus, type II ([place])   . Fatty liver   . Hyperlipidemia   . Hypertension   . Kidney disease     Past Surgical History:  Procedure Laterality Date  . COLONOSCOPY W/ POLYPECTOMY    . EYE SURGERY Bilateral    catarcts with lens  . MUSCLE BIOPSY Left 12/31/2017   Procedure: Left Deltoid Muscle Biopsy;  Surgeon: Ashok Pall, MD;  Location: [place];  Service: Neurosurgery;  Laterality: Left;  Left Deltoid Muscle Biopsy  . OTHER SURGICAL HISTORY     growths removed R buttock1970, RU arm 2001, and R wrist 2009.    No current facility-administered medications for this encounter.    Current Outpatient Medications   Medication Sig Dispense Refill Last Dose  . acetaminophen (TYLENOL) 650 MG CR tablet Take 650 mg by mouth every 8 (eight) hours as needed for pain.   Taking  . amLODipine (NORVASC) 10 MG tablet TAKE 1 TABLET BY MOUTH EVERY DAY 90 tablet 3 Taking  . aspirin 81 MG tablet Take 81 mg by mouth daily.   Taking  . atorvastatin (LIPITOR) 40 MG tablet TAKE 1 TABLET BY MOUTH EVERY DAY 90 tablet 1 Taking  . Cholecalciferol (VITAMIN D3) 2000 UNITS TABS Take 2,000 Units by mouth 2 (two) times daily.    Taking  . CONTOUR NEXT TEST test strip USE AS DIRECTED THREE TIMES A DAY 100 each 1 Taking  . furosemide (LASIX) 20 MG tablet Take 20 mg by mouth daily.   Taking  . Insulin Degludec-Liraglutide (XULTOPHY) 100-3.6 UNIT-MG/ML SOPN Inject 50 Units into the skin daily with breakfast. 5 pen 2 Taking  . Insulin Pen Needle (PEN NEEDLES) 31G X 8 MM MISC Use qhs 100 each 5 Taking  . Insulin Pen Needle 32G X 4 MM MISC USE AS DIRECTED EVERY MORNING 100 each 2 Taking  . isosorbide mononitrate (IMDUR) 30 MG 24 hr tablet Take 1 tablet (30 mg total) by mouth daily. 90 tablet 1   . latanoprost (XALATAN) 0.005 % ophthalmic solution Place 1 drop into the left eye at bedtime.   Taking  . lisinopril (PRINIVIL,ZESTRIL) 20 MG tablet Take 1 tablet (20 mg total) by mouth daily. 90 tablet 3 Taking  . metoprolol succinate (TOPROL-XL) 25 MG 24 hr tablet  TAKE 1.5 TABLETS BY MOUTH 2 TIMES DAILY. 270 tablet 2 Taking  . Omega-3 Fatty Acids (FISH OIL) 1200 MG CAPS Take 1,200 mg by mouth 2 (two) times daily.    Taking  . sodium bicarbonate 650 MG tablet Take 650 mg by mouth 3 (three) times daily.   Taking   No Known Allergies  Social History   Tobacco Use  . Smoking status: Former Smoker    Quit date: 06/07/1986    Years since quitting: 32.4  . Smokeless tobacco: Never Used  Substance Use Topics  . Alcohol use: No    Alcohol/week: 0.0 standard drinks    Comment: quit 1974    Family History  Problem Relation Age of Onset  .  Diabetes Other   . Heart disease Other   . Hyperlipidemia Other   . Hypertension Other   . Lung cancer Mother   . Hypertension Mother   . Diabetes Mother   . High Cholesterol Mother   . Heart disease Father   . Hypertension Father   . Diabetes Father   . High Cholesterol Father      Review of Systems  Constitutional: Negative for chills and fever.  HENT: Negative for congestion, sore throat and tinnitus.   Eyes: Negative for double vision, photophobia and pain.  Respiratory: Negative for cough, shortness of breath and wheezing.   Cardiovascular: Negative for chest pain, palpitations and orthopnea.  Gastrointestinal: Negative for heartburn, nausea and vomiting.  Genitourinary: Negative for dysuria, frequency and urgency.  Musculoskeletal: Positive for joint pain.  Neurological: Negative for dizziness, weakness and headaches.    Objective:  Physical Exam  Well nourished and well developed. General: Alert and oriented x3, cooperative and pleasant, no acute distress. Head: normocephalic, atraumatic, neck supple. Eyes: EOMI. Respiratory: breath sounds clear in all fields, no wheezing, rales, or rhonchi. Cardiovascular: Regular rate and rhythm, no murmurs, gallops or rubs.  Abdomen: non-tender to palpation and soft, normoactive bowel sounds. Musculoskeletal:  Right Hip Exam: ROM: Flexion to 90, Internal Rotation 0, External Rotation 0, and Abduction 10 degrees.  There is no tenderness over the greater trochanter.   Calves soft and nontender. Motor function intact in LE. Strength 5/5 LE bilaterally. Neuro: Distal pulses 2+. Sensation to light touch intact in LE.  Vital signs in last 24 hours: Blood pressure: 152/78 mmHg  Labs:   Estimated body mass index is 29.56 kg/m as calculated from the following:   Height as of 11/15/18: 5\' 10"  (1.778 m).   Weight as of 11/15/18: 93.4 kg.   Imaging Review Plain radiographs demonstrate severe degenerative joint disease of the right  hip(s). The bone quality appears to be adequate for age and reported activity level.   Assessment/Plan:  End stage arthritis, right hip(s)  The patient history, physical examination, clinical judgement of the provider and imaging studies are consistent with end stage degenerative joint disease of the right hip(s) and total hip arthroplasty is deemed medically necessary. The treatment options including medical management, injection therapy, arthroscopy and arthroplasty were discussed at length. The risks and benefits of total hip arthroplasty were presented and reviewed. The risks due to aseptic loosening, infection, stiffness, dislocation/subluxation,  thromboembolic complications and other imponderables were discussed.  The patient acknowledged the explanation, agreed to proceed with the plan and consent was signed. Patient is being admitted for inpatient treatment for surgery, pain control, PT, OT, prophylactic antibiotics, VTE prophylaxis, progressive ambulation and ADL's and discharge planning.The patient is planning to be discharged home.  Anticipated  LOS equal to or greater than 2 midnights due to - Age 27 and older with one or more of the following:  - Obesity  - Expected need for hospital services (PT, OT, Nursing) required for safe  discharge  - Anticipated need for postoperative skilled nursing care or inpatient rehab  - Active co-morbidities: Diabetes and CKD stage III OR   - Unanticipated findings during/Post Surgery: None  - Patient is a high risk of re-admission due to: None  Therapy Plans: HEP Disposition: home with wife, [name] Planned DVT Prophylaxis: Xarelto 10mg  daily (cannot tolerate NSAIDS due to CKD) DME needed: none PCP: Dr. Valli Glance Cardiologist: Dr. Harl Bowie Nephrologist: Dr. Randie Heinz Neuromuscular Physician: Dr. Clair Gulling Caress TXA: IV Allergies: NKDA Anesthesia Concerns: none BMI: 28.7 Last HgbA1c: 7.0% in April 2020  Other: Hx of progressive muscular atrophy, has  been cleared by ALS clinic regarding breathing and post-op rehabilitation concerns. Hx of CKD Stage III (baseline Cr. 1.9), no NSAIDs/Aspirin.   - Patient was instructed on what medications to stop prior to surgery. - Follow-up visit in 2 weeks with Dr. Wynelle Link - Begin physical therapy following surgery - Pre-operative lab work as pre-surgical testing - Prescriptions will be provided in hospital at time of discharge  Theresa Duty, PA-C Orthopedic Surgery [facility] [place]

## 2018-11-29 DIAGNOSIS — M25511 Pain in right shoulder: Secondary | ICD-10-CM | POA: Diagnosis not present

## 2018-11-29 DIAGNOSIS — M7501 Adhesive capsulitis of right shoulder: Secondary | ICD-10-CM | POA: Diagnosis not present

## 2018-12-06 NOTE — Patient Instructions (Addendum)
YOU NEED TO HAVE A COVID 19 TEST ON 12-09-18  @ 11:40 AM, THIS TEST MUST BE DONE BEFORE SURGERY, COME TO [place] ENTRANCE. ONCE YOUR COVID TEST IS COMPLETED, PLEASE BEGIN THE QUARANTINE INSTRUCTIONS AS OUTLINED IN YOUR HANDOUT.                DOWELL HOON    Your procedure is scheduled on: 12-14-2018    Report to [facility] Main  Entrance    Report to admitting at 1215 pm     Call this number if you have problems the morning of surgery [phone]    Remember:  NO SOLID FOOD AFTER MIDNIGHT THE NIGHT PRIOR TO SURGERY. NOTHING BY MOUTH EXCEPT CLEAR LIQUIDS UNTIL 11:45 am. . PLEASE FINISH ENSURE DRINK PER SURGEON ORDER 3 HOURS PRIOR TO SCHEDULED SURGERY TIME WHICH NEEDS TO BE COMPLETED AT 11:45 am.     CLEAR LIQUID DIET   Foods Allowed                                                                     Foods Excluded  Coffee and tea, regular and decaf                             liquids that you cannot  Plain Jell-O in any flavor                                             see through such as: Fruit ices (not with fruit pulp)                                     milk, soups, orange juice  Iced Popsicles                                    All solid food Carbonated beverages, regular and diet                                    Cranberry, grape and apple juices Sports drinks like Gatorade Lightly seasoned clear broth or consume(fat free) Sugar, honey syrup  Sample Menu Breakfast                                Lunch                                     Supper Cranberry juice                    Beef broth                            Chicken broth  Jell-O                                     Grape juice                           Apple juice Coffee or tea                        Jell-O                                      Popsicle                                                Coffee or tea                        Coffee or  tea  _____________________________________________________________________    Take these medicines the morning of surgery with A SIP OF WATER: Amlodipine (Norvasc), Isosorbide Mononitrate (Imdur), Metoprolol Succinate (Toprol-XL). You may also use and bring your eyedrops as needed.    BRUSH YOUR TEETH MORNING OF SURGERY AND RINSE YOUR MOUTH OUT, NO CHEWING GUM CANDY OR MINTS.                      You may not have any metal on your body including hair pins and              piercings     Do not wear jewelry, cologne, lotions, powders or deodorant                           Men may shave face and neck.   Do not bring valuables to the hospital. [place].  Contacts, dentures or bridgework may not be worn into surgery.       : _____________________________________________________________________  DO NOT TAKE ANY DIABETIC MEDICATIONS DAY OF YOUR SURGERY         How to Manage Your Diabetes Before and After Surgery  Why is it important to control my blood sugar before and after surgery? . Improving blood sugar levels before and after surgery helps healing and can limit problems. . A way of improving blood sugar control is eating a healthy diet by: o  Eating less sugar and carbohydrates o  Increasing activity/exercise o  Talking with your doctor about reaching your blood sugar goals . High blood sugars (greater than 180 mg/dL) can raise your risk of infections and slow your recovery, so you will need to focus on controlling your diabetes during the weeks before surgery. . Make sure that the doctor who takes care of your diabetes knows about your planned surgery including the date and location.  How do I manage my blood sugar before surgery? . Check your blood sugar at least 4 times a day, starting 2 days before surgery, to make sure that the level is not too high or low. o Check your blood sugar the morning  of your surgery when you wake  up and every 2 hours until you get to the Short Stay unit. . If your blood sugar is less than 70 mg/dL, you will need to treat for low blood sugar: o Do not take insulin. o Treat a low blood sugar (less than 70 mg/dL) with  cup of clear juice (cranberry or apple), 4 glucose tablets, OR glucose gel. o Recheck blood sugar in 15 minutes after treatment (to make sure it is greater than 70 mg/dL). If your blood sugar is not greater than 70 mg/dL on recheck, call [phone] for further instructions. . Report your blood sugar to the short stay nurse when you get to Short Stay.  . If you are admitted to the hospital after surgery: o Your blood sugar will be checked by the staff and you will probably be given insulin after surgery (instead of oral diabetes medicines) to make sure you have good blood sugar levels. o The goal for blood sugar control after surgery is 80-180 mg/dL.   WHAT DO I DO ABOUT MY DIABETES MEDICATION?  Marland Kitchen Do not take oral diabetes medicines (pills) the morning of surgery.  . THE DAY BEFORE SURGERY, take your usual 50 units of Xultophy insulin.         Reviewed and Endorsed by [facility] Patient Education Committee, August 2015           [facility] - Preparing for Surgery Before surgery, you can play an important role.  Because skin is not sterile, your skin needs to be as free of germs as possible.  You can reduce the number of germs on your skin by washing with CHG (chlorahexidine gluconate) soap before surgery.  CHG is an antiseptic cleaner which kills germs and bonds with the skin to continue killing germs even after washing. Please DO NOT use if you have an allergy to CHG or antibacterial soaps.  If your skin becomes reddened/irritated stop using the CHG and inform your nurse when you arrive at Short Stay. Do not shave (including legs and underarms) for at least 48 hours prior to the first CHG shower.  You may shave your face/neck. Please follow these instructions  carefully:  1.  Shower with CHG Soap the night before surgery and the  morning of Surgery.  2.  If you choose to wash your hair, wash your hair first as usual with your  normal  shampoo.  3.  After you shampoo, rinse your hair and body thoroughly to remove the  shampoo.                           4.  Use CHG as you would any other liquid soap.  You can apply chg directly  to the skin and wash                       Gently with a scrungie or clean washcloth.  5.  Apply the CHG Soap to your body ONLY FROM THE NECK DOWN.   Do not use on face/ open                           Wound or open sores. Avoid contact with eyes, ears mouth and genitals (private parts).                       Wash face,  Genitals (private parts) with your normal soap.             6.  Wash thoroughly, paying special attention to the area where your surgery  will be performed.  7.  Thoroughly rinse your body with warm water from the neck down.  8.  DO NOT shower/wash with your normal soap after using and rinsing off  the CHG Soap.                9.  Pat yourself dry with a clean towel.            10.  Wear clean pajamas.            11.  Place clean sheets on your bed the night of your first shower and do not  sleep with pets. Day of Surgery : Do not apply any lotions/deodorants the morning of surgery.  Please wear clean clothes to the hospital/surgery center.  FAILURE TO FOLLOW THESE INSTRUCTIONS MAY RESULT IN THE CANCELLATION OF YOUR SURGERY PATIENT SIGNATURE_________________________________  NURSE SIGNATURE__________________________________  ________________________________________________________________________   Adam Phenix  An incentive spirometer is a tool that can help keep your lungs clear and active. This tool measures how well you are filling your lungs with each breath. Taking long deep breaths may help reverse or decrease the chance of developing breathing (pulmonary) problems (especially infection)  following:  A long period of time when you are unable to move or be active. BEFORE THE PROCEDURE   If the spirometer includes an indicator to show your best effort, your nurse or respiratory therapist will set it to a desired goal.  If possible, sit up straight or lean slightly forward. Try not to slouch.  Hold the incentive spirometer in an upright position. INSTRUCTIONS FOR USE  1. Sit on the edge of your bed if possible, or sit up as far as you can in bed or on a chair. 2. Hold the incentive spirometer in an upright position. 3. Breathe out normally. 4. Place the mouthpiece in your mouth and seal your lips tightly around it. 5. Breathe in slowly and as deeply as possible, raising the piston or the ball toward the top of the column. 6. Hold your breath for 3-5 seconds or for as long as possible. Allow the piston or ball to fall to the bottom of the column. 7. Remove the mouthpiece from your mouth and breathe out normally. 8. Rest for a few seconds and repeat Steps 1 through 7 at least 10 times every 1-2 hours when you are awake. Take your time and take a few normal breaths between deep breaths. 9. The spirometer may include an indicator to show your best effort. Use the indicator as a goal to work toward during each repetition. 10. After each set of 10 deep breaths, practice coughing to be sure your lungs are clear. If you have an incision (the cut made at the time of surgery), support your incision when coughing by placing a pillow or rolled up towels firmly against it. Once you are able to get out of bed, walk around indoors and cough well. You may stop using the incentive spirometer when instructed by your caregiver.  RISKS AND COMPLICATIONS  Take your time so you do not get dizzy or light-headed.  If you are in pain, you may need to take or ask for pain medication before doing incentive spirometry. It is harder to take a deep breath if you are having pain. AFTER USE  Rest and  breathe slowly and easily.  It can be helpful to keep track of a log of your progress. Your caregiver can provide you with a simple table to help with this. If you are using the spirometer at home, follow these instructions: [place] IF:   You are having difficultly using the spirometer.  You have trouble using the spirometer as often as instructed.  Your pain medication is not giving enough relief while using the spirometer.  You develop fever of 100.5 F (38.1 C) or higher. SEEK IMMEDIATE MEDICAL CARE IF:   You cough up bloody sputum that had not been present before.  You develop fever of 102 F (38.9 C) or greater.  You develop worsening pain at or near the incision site. MAKE SURE YOU:   Understand these instructions.  Will watch your condition.  Will get help right away if you are not doing well or get worse. Document Released: 10/05/2006 Document Revised: 08/17/2011 Document Reviewed: 12/06/2006 Exit[employer] Patient Information 2014 [employer], [place].   ________________________________________________________________________  WHAT IS A BLOOD TRANSFUSION? Blood Transfusion Information  A transfusion is the replacement of blood or some of its parts. Blood is made up of multiple cells which provide different functions.  Red blood cells carry oxygen and are used for blood loss replacement.  White blood cells fight against infection.  Platelets control bleeding.  Plasma helps clot blood.  Other blood products are available for specialized needs, such as hemophilia or other clotting disorders. BEFORE THE TRANSFUSION  Who gives blood for transfusions?   Healthy volunteers who are fully evaluated to make sure their blood is safe. This is blood bank blood. Transfusion therapy is the safest it has ever been in the practice of medicine. Before blood is taken from a donor, a complete history is taken to make sure that person has no history of diseases nor engages in  risky social behavior (examples are intravenous drug use or sexual activity with multiple partners). The donor's travel history is screened to minimize risk of transmitting infections, such as malaria. The donated blood is tested for signs of infectious diseases, such as HIV and hepatitis. The blood is then tested to be sure it is compatible with you in order to minimize the chance of a transfusion reaction. If you or a relative donates blood, this is often done in anticipation of surgery and is not appropriate for emergency situations. It takes many days to process the donated blood. RISKS AND COMPLICATIONS Although transfusion therapy is very safe and saves many lives, the main dangers of transfusion include:   Getting an infectious disease.  Developing a transfusion reaction. This is an allergic reaction to something in the blood you were given. Every precaution is taken to prevent this. The decision to have a blood transfusion has been considered carefully by your caregiver before blood is given. Blood is not given unless the benefits outweigh the risks. AFTER THE TRANSFUSION  Right after receiving a blood transfusion, you will usually feel much better and more energetic. This is especially true if your red blood cells have gotten low (anemic). The transfusion raises the level of the red blood cells which carry oxygen, and this usually causes an energy increase.  The nurse administering the transfusion will monitor you carefully for complications. HOME CARE INSTRUCTIONS  No special instructions are needed after a transfusion. You may find your energy is better. Speak with your caregiver about any limitations on activity for underlying diseases you may have.  SEEK MEDICAL CARE IF:   Your condition is not improving after your transfusion.  You develop redness or irritation at the intravenous (IV) site. SEEK IMMEDIATE MEDICAL CARE IF:  Any of the following symptoms occur over the next 12  hours:  Shaking chills.  You have a temperature by mouth above 102 F (38.9 C), not controlled by medicine.  Chest, back, or muscle pain.  People around you feel you are not acting correctly or are confused.  Shortness of breath or difficulty breathing.  Dizziness and fainting.  You get a rash or develop hives.  You have a decrease in urine output.  Your urine turns a dark color or changes to pink, red, or brown. Any of the following symptoms occur over the next 10 days:  You have a temperature by mouth above 102 F (38.9 C), not controlled by medicine.  Shortness of breath.  Weakness after normal activity.  The white part of the eye turns yellow (jaundice).  You have a decrease in the amount of urine or are urinating less often.  Your urine turns a dark color or changes to pink, red, or brown. Document Released: 05/22/2000 Document Revised: 08/17/2011 Document Reviewed: 01/09/2008 [facility] Patient Information 2014 [place], [place].  _______________________________________________________________________

## 2018-12-07 ENCOUNTER — Ambulatory Visit: Payer: Medicare Other | Admitting: Cardiology

## 2018-12-07 ENCOUNTER — Other Ambulatory Visit: Payer: Self-pay

## 2018-12-07 ENCOUNTER — Encounter (HOSPITAL_COMMUNITY)
Admission: RE | Admit: 2018-12-07 | Discharge: 2018-12-07 | Disposition: A | Discharge status: Home / Self Care | Payer: Medicare Other | Source: Ambulatory Visit | Attending: Orthopedic Surgery | Admitting: Orthopedic Surgery

## 2018-12-07 ENCOUNTER — Encounter (HOSPITAL_COMMUNITY): Payer: Self-pay

## 2018-12-07 DIAGNOSIS — M1611 Unilateral primary osteoarthritis, right hip: Secondary | ICD-10-CM | POA: Diagnosis not present

## 2018-12-07 DIAGNOSIS — Z1159 Encounter for screening for other viral diseases: Secondary | ICD-10-CM | POA: Diagnosis not present

## 2018-12-07 DIAGNOSIS — I129 Hypertensive chronic kidney disease with stage 1 through stage 4 chronic kidney disease, or unspecified chronic kidney disease: Secondary | ICD-10-CM | POA: Insufficient documentation

## 2018-12-07 DIAGNOSIS — Z01812 Encounter for preprocedural laboratory examination: Secondary | ICD-10-CM | POA: Insufficient documentation

## 2018-12-07 DIAGNOSIS — N183 Chronic kidney disease, stage 3 (moderate): Secondary | ICD-10-CM | POA: Diagnosis not present

## 2018-12-07 DIAGNOSIS — E1122 Type 2 diabetes mellitus with diabetic chronic kidney disease: Secondary | ICD-10-CM | POA: Diagnosis not present

## 2018-12-07 LAB — CBC
HCT: 41.5 % (ref 39.0–52.0)
Hemoglobin: 13 g/dL (ref 13.0–17.0)
MCH: 29 pg (ref 26.0–34.0)
MCHC: 31.3 g/dL (ref 30.0–36.0)
MCV: 92.4 fL (ref 80.0–100.0)
Platelets: 178 10*3/uL (ref 150–400)
RBC: 4.49 MIL/uL (ref 4.22–5.81)
RDW: 14.2 % (ref 11.5–15.5)
WBC: 6.2 10*3/uL (ref 4.0–10.5)
nRBC: 0 % (ref 0.0–0.2)

## 2018-12-07 LAB — PROTIME-INR
INR: 1 (ref 0.8–1.2)
Prothrombin Time: 13.4 seconds (ref 11.4–15.2)

## 2018-12-07 LAB — COMPREHENSIVE METABOLIC PANEL
ALT: 27 U/L (ref 0–44)
AST: 24 U/L (ref 15–41)
Albumin: 4 g/dL (ref 3.5–5.0)
Alkaline Phosphatase: 86 U/L (ref 38–126)
Anion gap: 7 (ref 5–15)
BUN: 23 mg/dL (ref 8–23)
CO2: 27 mmol/L (ref 22–32)
Calcium: 9.6 mg/dL (ref 8.9–10.3)
Chloride: 108 mmol/L (ref 98–111)
Creatinine, Ser: 1.64 mg/dL — ABNORMAL HIGH (ref 0.61–1.24)
GFR calc Af Amer: 46 mL/min — ABNORMAL LOW (ref >60)
GFR calc non Af Amer: 40 mL/min — ABNORMAL LOW (ref >60)
Glucose, Bld: 80 mg/dL (ref 70–99)
Potassium: 4.7 mmol/L (ref 3.5–5.1)
Sodium: 142 mmol/L (ref 135–145)
Total Bilirubin: 0.8 mg/dL (ref 0.3–1.2)
Total Protein: 7.4 g/dL (ref 6.5–8.1)

## 2018-12-07 LAB — HEMOGLOBIN A1C
Hgb A1c MFr Bld: 7.5 % — ABNORMAL HIGH (ref 4.8–5.6)
Mean Plasma Glucose: 168.55 mg/dL

## 2018-12-07 LAB — SURGICAL PCR SCREEN
MRSA, PCR: NEGATIVE
Staphylococcus aureus: NEGATIVE

## 2018-12-07 LAB — GLUCOSE, CAPILLARY: Glucose-Capillary: 85 mg/dL (ref 70–99)

## 2018-12-07 LAB — APTT: aPTT: 26 seconds (ref 24–36)

## 2018-12-07 LAB — ABO/RH: ABO/RH(D): A POS

## 2018-12-07 NOTE — Progress Notes (Signed)
11-15-18 ([facility]) Cardiac Clearance from Dr. Harl Bowie   02-22-18 [facility]) EKG

## 2018-12-09 ENCOUNTER — Other Ambulatory Visit (HOSPITAL_COMMUNITY)
Admission: RE | Admit: 2018-12-09 | Discharge: 2018-12-09 | Disposition: A | Discharge status: Home / Self Care | Payer: Medicare Other | Source: Ambulatory Visit | Attending: Orthopedic Surgery | Admitting: Orthopedic Surgery

## 2018-12-09 DIAGNOSIS — I129 Hypertensive chronic kidney disease with stage 1 through stage 4 chronic kidney disease, or unspecified chronic kidney disease: Secondary | ICD-10-CM | POA: Diagnosis not present

## 2018-12-09 DIAGNOSIS — Z1159 Encounter for screening for other viral diseases: Secondary | ICD-10-CM | POA: Diagnosis not present

## 2018-12-09 DIAGNOSIS — Z01812 Encounter for preprocedural laboratory examination: Secondary | ICD-10-CM | POA: Diagnosis not present

## 2018-12-09 DIAGNOSIS — N183 Chronic kidney disease, stage 3 (moderate): Secondary | ICD-10-CM | POA: Diagnosis not present

## 2018-12-09 DIAGNOSIS — M1611 Unilateral primary osteoarthritis, right hip: Secondary | ICD-10-CM | POA: Diagnosis not present

## 2018-12-09 DIAGNOSIS — E1122 Type 2 diabetes mellitus with diabetic chronic kidney disease: Secondary | ICD-10-CM | POA: Diagnosis not present

## 2018-12-09 LAB — SARS CORONAVIRUS 2 (TAT 6-24 HRS): SARS Coronavirus 2: NEGATIVE

## 2018-12-12 ENCOUNTER — Other Ambulatory Visit: Payer: Self-pay | Admitting: *Deleted

## 2018-12-12 MED ORDER — METOPROLOL SUCCINATE ER 25 MG PO TB24: ORAL_TABLET | ORAL | 2 refills | Status: DC

## 2018-12-12 NOTE — Telephone Encounter (Signed)
Refill request for chlorthalidone denied. Per last office note, chlorthalidone was stopped by nephrology.

## 2018-12-12 NOTE — Progress Notes (Signed)
Anesthesia Chart Review   Case: [id number] Date/Time: 12/14/18 1429   Procedure: TOTAL HIP ARTHROPLASTY ANTERIOR APPROACH (Right ) - 161min   Anesthesia type: Choice   Pre-op diagnosis: right hip osteoarthritis   Location: [facility] ROOM 09 / [facility]   Surgeon: Gaynelle Arabian, MD      DISCUSSION:78 y.o. former smoker (quit 06/07/86) with h/o DM II, HLD, HTN, CKD Stage III (baseline creatinine 1.9), right hip OA scheduled for above procedure 12/14/2018 with Dr. Gaynelle Arabian.   Pt last seen by cardiologist, Dr. Carlyle Dolly, 11/15/2018.  Per OV note, "recent low risk stress test, tolerates greater than 4 METS without cardiopulmonary symptoms."  Anticipate pt can proceed with planned procedure barring acute status change.   VS: BP (!) 163/84   Pulse 72   Temp 37.2 C (Oral)   Resp 18   Ht 5\' 10"  (1.778 m)   Wt 92.4 kg   SpO2 100%   BMI 29.21 kg/m   PROVIDERS: Burdine, Virgina Evener, MD is PCP   Carlyle Dolly, MD is Cardiologist   Loni Beckwith, MD is Endocrinologist last seen 09/13/2018 DM controlled asymptomatic  Cyndie Chime, MD is Nephrologist  Last seen 10/18/2018 stable at this visit  LABS: Labs reviewed: Acceptable for surgery. (all labs ordered are listed, but only abnormal results are displayed)  Labs Reviewed  COMPREHENSIVE METABOLIC PANEL - Abnormal; Notable for the following components:      Result Value   Creatinine, Ser 1.64 (*)    GFR calc non Af Amer 40 (*)    GFR calc Af Amer 46 (*)    All other components within normal limits  HEMOGLOBIN A1C - Abnormal; Notable for the following components:   Hgb A1c MFr Bld 7.5 (*)    All other components within normal limits  SURGICAL PCR SCREEN  APTT  CBC  PROTIME-INR  GLUCOSE, CAPILLARY  TYPE AND SCREEN  ABO/RH     IMAGES:   EKG: 02/22/18 Rate 68 bpm Sinus rhythm  CV: Echo 02/12/16 Study Conclusions  - Left ventricle: The cavity size was normal. Wall thickness was   normal. Systolic function was  normal. The estimated ejection   fraction was in the range of 55% to 60%. Wall motion was normal;   there were no regional wall motion abnormalities. Doppler   parameters are consistent with abnormal left ventricular   relaxation (grade 1 diastolic dysfunction). - Aortic valve: Mildly calcified annulus. Trileaflet; normal   thickness leaflets. Valve area (VTI): 2.41 cm^2. Valve area   (Vmax): 2.35 cm^2. Valve area (Vmean): 2.05 cm^2. - Mitral valve: Mildly calcified annulus. Normal thickness leaflets   . - Technically adequate study.  ------------------------------------------------------------------- Labs, prior tests, procedures, and surgery: Stress nuclear imaging (July 2017).     EF was 50%.  Myocardial Perfusion 12/31/15 Impression:  Small focus of mild reversibility seen in the inferior myocardium Normal left ventricular wall motion Left ventricular ejection fraction 50% Non invasive risk stratification: low  Past Medical History:  Diagnosis Date  . Arthritis   . Diabetes mellitus, type II ([place])   . Fatty liver   . Hyperlipidemia   . Hypertension   . Kidney disease   . Progressive muscular atrophy ([place]) 04/27/2018    Past Surgical History:  Procedure Laterality Date  . COLONOSCOPY W/ POLYPECTOMY    . EYE SURGERY Bilateral    catarcts with lens  . MUSCLE BIOPSY Left 12/31/2017   Procedure: Left Deltoid Muscle Biopsy;  Surgeon: Ashok Pall, MD;  Location: [facility]  OR;  Service: Neurosurgery;  Laterality: Left;  Left Deltoid Muscle Biopsy  . OTHER SURGICAL HISTORY     growths removed R buttock1970, RU arm 2001, and R wrist 2009.    MEDICATIONS: . acetaminophen (TYLENOL) 650 MG CR tablet  . amLODipine (NORVASC) 10 MG tablet  . aspirin 81 MG tablet  . atorvastatin (LIPITOR) 40 MG tablet  . Cholecalciferol (VITAMIN D3) 2000 UNITS TABS  . CONTOUR NEXT TEST test strip  . furosemide (LASIX) 20 MG tablet  . Insulin Degludec-Liraglutide (XULTOPHY) 100-3.6 UNIT-MG/ML SOPN  .  Insulin Pen Needle (PEN NEEDLES) 31G X 8 MM MISC  . Insulin Pen Needle 32G X 4 MM MISC  . isosorbide mononitrate (IMDUR) 30 MG 24 hr tablet  . latanoprost (XALATAN) 0.005 % ophthalmic solution  . lisinopril (PRINIVIL,ZESTRIL) 20 MG tablet  . metoprolol succinate (TOPROL-XL) 25 MG 24 hr tablet  . Omega-3 Fatty Acids (FISH OIL) 1200 MG CAPS  . sodium bicarbonate 650 MG tablet   No current facility-administered medications for this encounter.    Maia Plan [facility] Pre-Surgical Testing [phone] 12/12/18  11:39 AM

## 2018-12-12 NOTE — Anesthesia Preprocedure Evaluation (Addendum)
Anesthesia Evaluation  Patient identified by MRN, date of birth, ID band Patient awake    Reviewed: Allergy & Precautions, NPO status , Patient's Chart, lab work & pertinent test results, reviewed documented beta blocker date and time   Airway Mallampati: I       Dental no notable dental hx.    Pulmonary former smoker,    Pulmonary exam normal breath sounds clear to auscultation       Cardiovascular hypertension, Pt. on medications and Pt. on home beta blockers Normal cardiovascular exam Rhythm:Regular Rate:Normal     Neuro/Psych negative neurological ROS  negative psych ROS   GI/Hepatic negative GI ROS, Neg liver ROS,   Endo/Other  diabetes, Type 2, Insulin Dependent  Renal/GU Renal InsufficiencyRenal diseaseCKD III    negative genitourinary   Musculoskeletal   Abdominal Normal abdominal exam  (+)   Peds  Hematology negative hematology ROS (+)   Anesthesia Other Findings  Maylon Peppers  Physician Assistant Certified  Anesthesiology     Progress Notes  Signed     Date of Service:  12/07/2018  1:00 PM                  Signed                     Expand widget buttonCollapse widget button    Show:Clear all   ManualTemplateCopied  Added by:     Konrad Felix, PA-C   Hover for detailscustomization button                                                                               Anesthesia Chart Review         Case: [id number]   Date/Time: 12/14/18 1429        Procedure: TOTAL HIP ARTHROPLASTY ANTERIOR APPROACH (Right ) - 166min        Anesthesia type: Choice        Pre-op diagnosis: right hip osteoarthritis        Location[facility]OM 09 [facility]        Surgeon: Gaynelle Arabian, MD               DISCUSSION:78 y.o. former smoker (quit 06/07/86) with h/o DM  II, HLD, HTN, CKD Stage III (baseline creatinine 1.9), right hip OA scheduled for above procedure 12/14/2018 with Dr. Gaynelle Arabian.      Pt last seen by cardiologist, Dr. Carlyle Dolly, 11/15/2018.  Per OV note, "recent low risk stress test, tolerates greater than 4 METS without cardiopulmonary symptoms."     Anticipate pt can proceed with planned procedure barring acute status change.      VS: BP (!) 163/84   Pulse 72   Temp 37.2 C (Oral)   Resp 18   Ht 5\' 10"  (1.778 m)   Wt 92.4 kg   SpO2 100%   BMI 29.21 kg/m      PROVIDERS:  Burdine, Virgina Evener, MD is PCP      Carlyle Dolly, MD is Cardiologist      Loni Beckwith, MD is Endocrinologist last seen 09/13/2018 DM controlled asymptomatic     Daeihagh, Wynelle Fanny, MD is Nephrologist  Last seen 10/18/2018 stable at this visit   LABS: Labs reviewed: Acceptable for surgery.  (all labs ordered are listed, but only abnormal results are displayed)           Labs Reviewed    COMPREHENSIVE METABOLIC PANEL - Abnormal; Notable for the following components:        Result   Value        Creatinine, Ser   1.64 (*)            GFR calc non Af Amer   40 (*)            GFR calc Af Amer   46 (*)            All other components within normal limits    HEMOGLOBIN A1C - Abnormal; Notable for the following components:        Hgb A1c MFr Bld   7.5 (*)            All other components within normal limits    SURGICAL PCR SCREEN    APTT    CBC    PROTIME-INR    GLUCOSE, CAPILLARY    TYPE AND SCREEN    ABO/RH            IMAGES:        EKG:  02/22/18  Rate 68 bpm  Sinus rhythm     CV:  Echo 02/12/16  Study Conclusions     - Left ventricle: The cavity size was normal. Wall thickness was    normal. Systolic function was normal. The estimated ejection    fraction was in the range of 55% to 60%. Wall motion was normal;    there were no  regional wall motion abnormalities. Doppler    parameters are consistent with abnormal left ventricular    relaxation (grade 1 diastolic dysfunction).  - Aortic valve: Mildly calcified annulus. Trileaflet; normal    thickness leaflets. Valve area (VTI): 2.41 cm^2. Valve area    (Vmax): 2.35 cm^2. Valve area (Vmean): 2.05 cm^2.  - Mitral valve: Mildly calcified annulus. Normal thickness leaflets    .  - Technically adequate study.     -------------------------------------------------------------------  Labs, prior tests, procedures, and surgery:  Stress nuclear imaging (July 2017).     EF was 50%.     Myocardial Perfusion 12/31/15  Impression:   Small focus of mild reversibility seen in the inferior myocardium  Normal left ventricular wall motion  Left ventricular ejection fraction 50%  Non invasive risk stratification: low        Past Medical History:    Diagnosis   Date    .   Arthritis        .   Diabetes mellitus, type II ([place])        .   Fatty liver        .   Hyperlipidemia        .   Hypertension        .   Kidney disease        .   Progressive muscular atrophy ([place])   04/27/2018               Past Surgical History:    Procedure   Laterality   Date    .   COLONOSCOPY W/ POLYPECTOMY            .   EYE SURGERY   Bilateral  catarcts with lens    .   MUSCLE BIOPSY   Left   12/31/2017        Procedure: Left Deltoid Muscle Biopsy;  Surgeon: Ashok Pall, MD;  Location: [place];  Service: Neurosurgery;  Laterality: Left;  Left Deltoid Muscle Biopsy    .   OTHER SURGICAL HISTORY                growths removed R buttock1970, RU arm 2001, and R wrist 2009.         MEDICATIONS:   .   acetaminophen (TYLENOL) 650 MG CR tablet    .   amLODipine (NORVASC) 10 MG tablet    .   aspirin 81 MG tablet    .   atorvastatin (LIPITOR) 40 MG  tablet    .   Cholecalciferol (VITAMIN D3) 2000 UNITS TABS    .   CONTOUR NEXT TEST test strip    .   furosemide (LASIX) 20 MG tablet    .   Insulin Degludec-Liraglutide (XULTOPHY) 100-3.6 UNIT-MG/ML SOPN    .   Insulin Pen Needle (PEN NEEDLES) 31G X 8 MM MISC    .   Insulin Pen Needle 32G X 4 MM MISC    .   isosorbide mononitrate (IMDUR) 30 MG 24 hr tablet    .   latanoprost (XALATAN) 0.005 % ophthalmic solution    .   lisinopril (PRINIVIL,ZESTRIL) 20 MG tablet    .   metoprolol succinate (TOPROL-XL) 25 MG 24 hr tablet    .   Omega-3 Fatty Acids (FISH OIL) 1200 MG CAPS    .   sodium bicarbonate 650 MG tablet        No current facility-administered medications for this encounter.       Konrad Felix, PA-C  [facility] Pre-Surgical Testing          Reproductive/Obstetrics                           Anesthesia Physical Anesthesia Plan  ASA: III  Anesthesia Plan: Spinal   Post-op Pain Management:    Induction:   PONV Risk Score and Plan: 2 and Ondansetron and Treatment may vary due to age or medical condition  Airway Management Planned: Natural Airway, Nasal Cannula and Mask  Additional Equipment:   Intra-op Plan:   Post-operative Plan:   Informed Consent: I have reviewed the patients History and Physical, chart, labs and discussed the procedure including the risks, benefits and alternatives for the proposed anesthesia with the patient or authorized representative who has indicated his/her understanding and acceptance.       Plan Discussed with: CRNA  Anesthesia Plan Comments: (See PAT note 12/07/2018, Konrad Felix, PA-C)       Anesthesia Quick Evaluation

## 2018-12-14 ENCOUNTER — Inpatient Hospital Stay (HOSPITAL_COMMUNITY): Payer: Medicare Other

## 2018-12-14 ENCOUNTER — Observation Stay (HOSPITAL_COMMUNITY)
Admission: RE | Admit: 2018-12-14 | Discharge: 2018-12-15 | Disposition: A | Discharge status: Home / Self Care | Payer: Medicare Other | Attending: Orthopedic Surgery | Admitting: Orthopedic Surgery

## 2018-12-14 ENCOUNTER — Encounter (HOSPITAL_COMMUNITY): Payer: Self-pay | Admitting: General Practice

## 2018-12-14 ENCOUNTER — Inpatient Hospital Stay (HOSPITAL_COMMUNITY): Payer: Medicare Other | Admitting: Certified Registered Nurse Anesthetist

## 2018-12-14 ENCOUNTER — Telehealth (HOSPITAL_COMMUNITY): Payer: Self-pay | Admitting: *Deleted

## 2018-12-14 ENCOUNTER — Other Ambulatory Visit: Payer: Self-pay

## 2018-12-14 ENCOUNTER — Encounter (HOSPITAL_COMMUNITY)
Admission: RE | Disposition: A | Discharge status: Home / Self Care | Payer: Self-pay | Source: Home / Self Care | Attending: Orthopedic Surgery

## 2018-12-14 ENCOUNTER — Inpatient Hospital Stay (HOSPITAL_COMMUNITY): Payer: Medicare Other | Admitting: Physician Assistant

## 2018-12-14 DIAGNOSIS — Z794 Long term (current) use of insulin: Secondary | ICD-10-CM | POA: Insufficient documentation

## 2018-12-14 DIAGNOSIS — E669 Obesity, unspecified: Secondary | ICD-10-CM | POA: Insufficient documentation

## 2018-12-14 DIAGNOSIS — Z8249 Family history of ischemic heart disease and other diseases of the circulatory system: Secondary | ICD-10-CM | POA: Insufficient documentation

## 2018-12-14 DIAGNOSIS — Z6829 Body mass index (BMI) 29.0-29.9, adult: Secondary | ICD-10-CM | POA: Insufficient documentation

## 2018-12-14 DIAGNOSIS — I129 Hypertensive chronic kidney disease with stage 1 through stage 4 chronic kidney disease, or unspecified chronic kidney disease: Secondary | ICD-10-CM | POA: Insufficient documentation

## 2018-12-14 DIAGNOSIS — N183 Chronic kidney disease, stage 3 (moderate): Secondary | ICD-10-CM | POA: Insufficient documentation

## 2018-12-14 DIAGNOSIS — E1122 Type 2 diabetes mellitus with diabetic chronic kidney disease: Secondary | ICD-10-CM | POA: Insufficient documentation

## 2018-12-14 DIAGNOSIS — Z87891 Personal history of nicotine dependence: Secondary | ICD-10-CM | POA: Insufficient documentation

## 2018-12-14 DIAGNOSIS — K760 Fatty (change of) liver, not elsewhere classified: Secondary | ICD-10-CM | POA: Diagnosis not present

## 2018-12-14 DIAGNOSIS — Z471 Aftercare following joint replacement surgery: Secondary | ICD-10-CM | POA: Diagnosis not present

## 2018-12-14 DIAGNOSIS — M25751 Osteophyte, right hip: Secondary | ICD-10-CM | POA: Insufficient documentation

## 2018-12-14 DIAGNOSIS — G1225 Progressive spinal muscle atrophy: Secondary | ICD-10-CM | POA: Insufficient documentation

## 2018-12-14 DIAGNOSIS — Z833 Family history of diabetes mellitus: Secondary | ICD-10-CM | POA: Diagnosis not present

## 2018-12-14 DIAGNOSIS — Z1159 Encounter for screening for other viral diseases: Secondary | ICD-10-CM | POA: Insufficient documentation

## 2018-12-14 DIAGNOSIS — Z79899 Other long term (current) drug therapy: Secondary | ICD-10-CM | POA: Insufficient documentation

## 2018-12-14 DIAGNOSIS — E782 Mixed hyperlipidemia: Secondary | ICD-10-CM | POA: Insufficient documentation

## 2018-12-14 DIAGNOSIS — R262 Difficulty in walking, not elsewhere classified: Secondary | ICD-10-CM | POA: Diagnosis not present

## 2018-12-14 DIAGNOSIS — Z8349 Family history of other endocrine, nutritional and metabolic diseases: Secondary | ICD-10-CM | POA: Diagnosis not present

## 2018-12-14 DIAGNOSIS — E785 Hyperlipidemia, unspecified: Secondary | ICD-10-CM | POA: Insufficient documentation

## 2018-12-14 DIAGNOSIS — M1611 Unilateral primary osteoarthritis, right hip: Secondary | ICD-10-CM | POA: Diagnosis not present

## 2018-12-14 DIAGNOSIS — Z419 Encounter for procedure for purposes other than remedying health state, unspecified: Secondary | ICD-10-CM

## 2018-12-14 DIAGNOSIS — Z7982 Long term (current) use of aspirin: Secondary | ICD-10-CM | POA: Diagnosis not present

## 2018-12-14 DIAGNOSIS — Z96649 Presence of unspecified artificial hip joint: Secondary | ICD-10-CM

## 2018-12-14 DIAGNOSIS — M169 Osteoarthritis of hip, unspecified: Secondary | ICD-10-CM | POA: Diagnosis present

## 2018-12-14 DIAGNOSIS — Z96641 Presence of right artificial hip joint: Secondary | ICD-10-CM | POA: Diagnosis not present

## 2018-12-14 HISTORY — PX: TOTAL HIP ARTHROPLASTY: SHX124

## 2018-12-14 LAB — TYPE AND SCREEN
ABO/RH(D): A POS
Antibody Screen: NEGATIVE

## 2018-12-14 LAB — GLUCOSE, CAPILLARY
Glucose-Capillary: 138 mg/dL — ABNORMAL HIGH (ref 70–99)
Glucose-Capillary: 106 mg/dL — ABNORMAL HIGH (ref 70–99)
Glucose-Capillary: 166 mg/dL — ABNORMAL HIGH (ref 70–99)
Glucose-Capillary: 284 mg/dL — ABNORMAL HIGH (ref 70–99)

## 2018-12-14 SURGERY — ARTHROPLASTY, HIP, TOTAL, ANTERIOR APPROACH
Anesthesia: Spinal | Laterality: Right

## 2018-12-14 MED ORDER — ACETAMINOPHEN 10 MG/ML IV SOLN
1000.0000 mg | Freq: Four times a day (QID) | INTRAVENOUS | Status: DC
  Administered 2018-12-14: 1000 mg via INTRAVENOUS
  Filled 2018-12-14: qty 100

## 2018-12-14 MED ORDER — DIPHENHYDRAMINE HCL 12.5 MG/5ML PO ELIX: 12.5000 mg | ORAL_SOLUTION | ORAL | Status: DC | PRN

## 2018-12-14 MED ORDER — TRAMADOL HCL 50 MG PO TABS: 50.0000 mg | ORAL_TABLET | Freq: Four times a day (QID) | ORAL | Status: DC | PRN

## 2018-12-14 MED ORDER — BISACODYL 10 MG RE SUPP: 10.0000 mg | Freq: Every day | RECTAL | Status: DC | PRN

## 2018-12-14 MED ORDER — ONDANSETRON HCL 4 MG PO TABS: 4.0000 mg | ORAL_TABLET | Freq: Four times a day (QID) | ORAL | Status: DC | PRN

## 2018-12-14 MED ORDER — BUPIVACAINE-EPINEPHRINE (PF) 0.25% -1:200000 IJ SOLN
INTRAMUSCULAR | Status: AC
  Filled 2018-12-14: qty 30

## 2018-12-14 MED ORDER — DEXAMETHASONE SODIUM PHOSPHATE 10 MG/ML IJ SOLN
10.0000 mg | Freq: Once | INTRAMUSCULAR | Status: AC
  Administered 2018-12-15: 10 mg via INTRAVENOUS
  Filled 2018-12-14: qty 1

## 2018-12-14 MED ORDER — HYDROMORPHONE HCL 1 MG/ML IJ SOLN
0.2500 mg | INTRAMUSCULAR | Status: DC | PRN
  Administered 2018-12-14 (×2): 0.5 mg via INTRAVENOUS

## 2018-12-14 MED ORDER — FUROSEMIDE 20 MG PO TABS
20.0000 mg | ORAL_TABLET | Freq: Every day | ORAL | Status: DC
  Administered 2018-12-15: 20 mg via ORAL
  Filled 2018-12-14: qty 1

## 2018-12-14 MED ORDER — HYDROMORPHONE HCL 1 MG/ML IJ SOLN
INTRAMUSCULAR | Status: AC
  Filled 2018-12-14: qty 1

## 2018-12-14 MED ORDER — RIVAROXABAN 10 MG PO TABS
10.0000 mg | ORAL_TABLET | Freq: Every day | ORAL | Status: DC
  Administered 2018-12-15: 10 mg via ORAL
  Filled 2018-12-14: qty 1

## 2018-12-14 MED ORDER — KETOROLAC TROMETHAMINE 15 MG/ML IJ SOLN
15.0000 mg | Freq: Once | INTRAMUSCULAR | Status: AC
  Administered 2018-12-14: 15 mg via INTRAVENOUS

## 2018-12-14 MED ORDER — INSULIN GLARGINE 100 UNIT/ML ~~LOC~~ SOLN
50.0000 [IU] | Freq: Every day | SUBCUTANEOUS | Status: DC
  Administered 2018-12-15: 50 [IU] via SUBCUTANEOUS
  Filled 2018-12-14: qty 0.5

## 2018-12-14 MED ORDER — BUPIVACAINE-EPINEPHRINE (PF) 0.25% -1:200000 IJ SOLN
INTRAMUSCULAR | Status: DC | PRN
  Administered 2018-12-14: 30 mL via PERINEURAL

## 2018-12-14 MED ORDER — AMLODIPINE BESYLATE 10 MG PO TABS
10.0000 mg | ORAL_TABLET | Freq: Every day | ORAL | Status: DC
  Administered 2018-12-15: 09:00:00 10 mg via ORAL
  Filled 2018-12-14: qty 1

## 2018-12-14 MED ORDER — EPHEDRINE SULFATE-NACL 50-0.9 MG/10ML-% IV SOSY
PREFILLED_SYRINGE | INTRAVENOUS | Status: DC | PRN
  Administered 2018-12-14 (×3): 10 mg via INTRAVENOUS

## 2018-12-14 MED ORDER — TRANEXAMIC ACID-NACL 1000-0.7 MG/100ML-% IV SOLN
1000.0000 mg | INTRAVENOUS | Status: AC
  Administered 2018-12-14: 13:00:00 1000 mg via INTRAVENOUS
  Filled 2018-12-14: qty 100

## 2018-12-14 MED ORDER — LACTATED RINGERS IV SOLN
INTRAVENOUS | Status: DC
  Administered 2018-12-14 (×2): via INTRAVENOUS

## 2018-12-14 MED ORDER — INSULIN ASPART 100 UNIT/ML ~~LOC~~ SOLN
0.0000 [IU] | Freq: Three times a day (TID) | SUBCUTANEOUS | Status: DC
  Administered 2018-12-14: 3 [IU] via SUBCUTANEOUS
  Administered 2018-12-15: 5 [IU] via SUBCUTANEOUS
  Administered 2018-12-15: 09:00:00 8 [IU] via SUBCUTANEOUS

## 2018-12-14 MED ORDER — INSULIN DEGLUDEC-LIRAGLUTIDE 100-3.6 UNIT-MG/ML ~~LOC~~ SOPN: 50.0000 [IU] | PEN_INJECTOR | Freq: Every day | SUBCUTANEOUS | Status: DC

## 2018-12-14 MED ORDER — ATORVASTATIN CALCIUM 40 MG PO TABS
40.0000 mg | ORAL_TABLET | Freq: Every day | ORAL | Status: DC
  Administered 2018-12-15: 40 mg via ORAL
  Filled 2018-12-14: qty 1

## 2018-12-14 MED ORDER — MENTHOL 3 MG MT LOZG: 1.0000 | LOZENGE | OROMUCOSAL | Status: DC | PRN

## 2018-12-14 MED ORDER — PROPOFOL 500 MG/50ML IV EMUL
INTRAVENOUS | Status: DC | PRN
  Administered 2018-12-14: 40 ug/kg/min via INTRAVENOUS

## 2018-12-14 MED ORDER — FENTANYL CITRATE (PF) 100 MCG/2ML IJ SOLN
INTRAMUSCULAR | Status: AC
  Filled 2018-12-14: qty 2

## 2018-12-14 MED ORDER — BUPIVACAINE IN DEXTROSE 0.75-8.25 % IT SOLN
INTRATHECAL | Status: DC | PRN
  Administered 2018-12-14: 1.8 mL via INTRATHECAL

## 2018-12-14 MED ORDER — ISOSORBIDE MONONITRATE ER 30 MG PO TB24
30.0000 mg | ORAL_TABLET | Freq: Every day | ORAL | Status: DC
  Administered 2018-12-15: 30 mg via ORAL
  Filled 2018-12-14: qty 1

## 2018-12-14 MED ORDER — EPHEDRINE 5 MG/ML INJ
INTRAVENOUS | Status: AC
  Filled 2018-12-14: qty 10

## 2018-12-14 MED ORDER — METHOCARBAMOL 500 MG PO TABS
500.0000 mg | ORAL_TABLET | Freq: Four times a day (QID) | ORAL | Status: DC | PRN
  Administered 2018-12-14 – 2018-12-15 (×2): 500 mg via ORAL
  Filled 2018-12-14 (×3): qty 1

## 2018-12-14 MED ORDER — MEPERIDINE HCL 50 MG/ML IJ SOLN: 6.2500 mg | INTRAMUSCULAR | Status: DC | PRN

## 2018-12-14 MED ORDER — DOCUSATE SODIUM 100 MG PO CAPS
100.0000 mg | ORAL_CAPSULE | Freq: Two times a day (BID) | ORAL | Status: DC
  Administered 2018-12-14 – 2018-12-15 (×2): 100 mg via ORAL
  Filled 2018-12-14 (×2): qty 1

## 2018-12-14 MED ORDER — CEFAZOLIN SODIUM-DEXTROSE 2-4 GM/100ML-% IV SOLN
2.0000 g | Freq: Four times a day (QID) | INTRAVENOUS | Status: AC
  Administered 2018-12-14 – 2018-12-15 (×2): 2 g via INTRAVENOUS
  Filled 2018-12-14 (×2): qty 100

## 2018-12-14 MED ORDER — METHOCARBAMOL 500 MG IVPB - SIMPLE MED
500.0000 mg | Freq: Four times a day (QID) | INTRAVENOUS | Status: DC | PRN
  Filled 2018-12-14: qty 50

## 2018-12-14 MED ORDER — ACETAMINOPHEN 500 MG PO TABS
500.0000 mg | ORAL_TABLET | Freq: Four times a day (QID) | ORAL | Status: DC
  Administered 2018-12-14: 500 mg via ORAL
  Filled 2018-12-14: qty 1

## 2018-12-14 MED ORDER — PHENOL 1.4 % MT LIQD: 1.0000 | OROMUCOSAL | Status: DC | PRN

## 2018-12-14 MED ORDER — DEXAMETHASONE SODIUM PHOSPHATE 10 MG/ML IJ SOLN
8.0000 mg | Freq: Once | INTRAMUSCULAR | Status: AC
  Administered 2018-12-14: 8 mg via INTRAVENOUS

## 2018-12-14 MED ORDER — ONDANSETRON HCL 4 MG/2ML IJ SOLN: 4.0000 mg | Freq: Four times a day (QID) | INTRAMUSCULAR | Status: DC | PRN

## 2018-12-14 MED ORDER — POVIDONE-IODINE 10 % EX SWAB: 2.0000 "application " | Freq: Once | CUTANEOUS | Status: DC

## 2018-12-14 MED ORDER — METOCLOPRAMIDE HCL 5 MG PO TABS: 5.0000 mg | ORAL_TABLET | Freq: Three times a day (TID) | ORAL | Status: DC | PRN

## 2018-12-14 MED ORDER — FLEET ENEMA 7-19 GM/118ML RE ENEM: 1.0000 | ENEMA | Freq: Once | RECTAL | Status: DC | PRN

## 2018-12-14 MED ORDER — CHLORHEXIDINE GLUCONATE 4 % EX LIQD: 60.0000 mL | Freq: Once | CUTANEOUS | Status: DC

## 2018-12-14 MED ORDER — KETOROLAC TROMETHAMINE 30 MG/ML IJ SOLN
INTRAMUSCULAR | Status: AC
  Filled 2018-12-14: qty 1

## 2018-12-14 MED ORDER — POLYETHYLENE GLYCOL 3350 17 G PO PACK: 17.0000 g | PACK | Freq: Every day | ORAL | Status: DC | PRN

## 2018-12-14 MED ORDER — PROPOFOL 10 MG/ML IV BOLUS
INTRAVENOUS | Status: DC | PRN
  Administered 2018-12-14: 40 mg via INTRAVENOUS

## 2018-12-14 MED ORDER — HYDROCODONE-ACETAMINOPHEN 5-325 MG PO TABS
1.0000 | ORAL_TABLET | ORAL | Status: DC | PRN
  Administered 2018-12-14: 1 via ORAL
  Administered 2018-12-15: 2 via ORAL
  Administered 2018-12-15: 1 via ORAL
  Filled 2018-12-14 (×3): qty 1

## 2018-12-14 MED ORDER — CEFAZOLIN SODIUM-DEXTROSE 2-4 GM/100ML-% IV SOLN
2.0000 g | INTRAVENOUS | Status: AC
  Administered 2018-12-14: 2 g via INTRAVENOUS
  Filled 2018-12-14: qty 100

## 2018-12-14 MED ORDER — METOPROLOL SUCCINATE ER 25 MG PO TB24
37.5000 mg | ORAL_TABLET | Freq: Two times a day (BID) | ORAL | Status: DC
  Administered 2018-12-14 – 2018-12-15 (×2): 37.5 mg via ORAL
  Filled 2018-12-14 (×2): qty 2

## 2018-12-14 MED ORDER — SODIUM CHLORIDE 0.9 % IV SOLN
INTRAVENOUS | Status: DC
  Administered 2018-12-14 – 2018-12-15 (×2): via INTRAVENOUS

## 2018-12-14 MED ORDER — FENTANYL CITRATE (PF) 100 MCG/2ML IJ SOLN
INTRAMUSCULAR | Status: DC | PRN
  Administered 2018-12-14 (×2): 50 ug via INTRAVENOUS

## 2018-12-14 MED ORDER — METOCLOPRAMIDE HCL 5 MG/ML IJ SOLN: 5.0000 mg | Freq: Three times a day (TID) | INTRAMUSCULAR | Status: DC | PRN

## 2018-12-14 MED ORDER — MORPHINE SULFATE (PF) 4 MG/ML IV SOLN: 0.5000 mg | INTRAVENOUS | Status: DC | PRN

## 2018-12-14 MED ORDER — ONDANSETRON HCL 4 MG/2ML IJ SOLN
INTRAMUSCULAR | Status: DC | PRN
  Administered 2018-12-14: 4 mg via INTRAVENOUS

## 2018-12-14 MED ORDER — PROMETHAZINE HCL 25 MG/ML IJ SOLN: 6.2500 mg | INTRAMUSCULAR | Status: DC | PRN

## 2018-12-14 MED ORDER — LATANOPROST 0.005 % OP SOLN
1.0000 [drp] | Freq: Every day | OPHTHALMIC | Status: DC
  Filled 2018-12-14: qty 2.5

## 2018-12-14 SURGICAL SUPPLY — 45 items
BAG DECANTER FOR FLEXI CONT (MISCELLANEOUS) IMPLANT
BAG ZIPLOCK 12X15 (MISCELLANEOUS) IMPLANT
BLADE SAG 18X100X1.27 (BLADE) ×3 IMPLANT
CLOSURE WOUND 1/2 X4 (GAUZE/BANDAGES/DRESSINGS) ×1
COVER PERINEAL POST (MISCELLANEOUS) ×3 IMPLANT
COVER SURGICAL LIGHT HANDLE (MISCELLANEOUS) ×3 IMPLANT
COVER WAND RF STERILE (DRAPES) IMPLANT
CUP ACETBLR 52 OD PINNACLE (Hips) ×2 IMPLANT
DECANTER SPIKE VIAL GLASS SM (MISCELLANEOUS) ×3 IMPLANT
DRAPE STERI IOBAN 125X83 (DRAPES) ×3 IMPLANT
DRAPE U-SHAPE 47X51 STRL (DRAPES) ×6 IMPLANT
DRSG ADAPTIC 3X8 NADH LF (GAUZE/BANDAGES/DRESSINGS) ×3 IMPLANT
DRSG MEPILEX BORDER 4X4 (GAUZE/BANDAGES/DRESSINGS) ×3 IMPLANT
DRSG MEPILEX BORDER 4X8 (GAUZE/BANDAGES/DRESSINGS) ×3 IMPLANT
DURAPREP 26ML APPLICATOR (WOUND CARE) ×3 IMPLANT
ELECT REM PT RETURN 15FT ADLT (MISCELLANEOUS) ×3 IMPLANT
EVACUATOR 1/8 PVC DRAIN (DRAIN) ×3 IMPLANT
GLOVE BIO SURGEON STRL SZ 6 (GLOVE) IMPLANT
GLOVE BIO SURGEON STRL SZ7 (GLOVE) IMPLANT
GLOVE BIO SURGEON STRL SZ8 (GLOVE) ×3 IMPLANT
GLOVE BIOGEL PI IND STRL 6.5 (GLOVE) IMPLANT
GLOVE BIOGEL PI IND STRL 7.0 (GLOVE) IMPLANT
GLOVE BIOGEL PI IND STRL 8 (GLOVE) ×1 IMPLANT
GLOVE BIOGEL PI INDICATOR 6.5 (GLOVE)
GLOVE BIOGEL PI INDICATOR 7.0 (GLOVE)
GLOVE BIOGEL PI INDICATOR 8 (GLOVE) ×2
GOWN STRL REUS W/TWL LRG LVL3 (GOWN DISPOSABLE) ×3 IMPLANT
GOWN STRL REUS W/TWL XL LVL3 (GOWN DISPOSABLE) ×4 IMPLANT
HEAD FEM STD 32X+5 STRL (Hips) ×2 IMPLANT
HOLDER FOLEY CATH W/STRAP (MISCELLANEOUS) ×3 IMPLANT
KIT TURNOVER KIT A (KITS) ×2 IMPLANT
LINER MARATHON NEUT +4X52X32 (Hips) ×2 IMPLANT
MANIFOLD NEPTUNE II (INSTRUMENTS) ×3 IMPLANT
PACK ANTERIOR HIP CUSTOM (KITS) ×3 IMPLANT
STEM FEM ACTIS STD SZ4 (Stem) ×2 IMPLANT
STRIP CLOSURE SKIN 1/2X4 (GAUZE/BANDAGES/DRESSINGS) ×2 IMPLANT
SUT ETHIBOND NAB CT1 #1 30IN (SUTURE) ×3 IMPLANT
SUT MNCRL AB 4-0 PS2 18 (SUTURE) ×3 IMPLANT
SUT STRATAFIX 0 PDS 27 VIOLET (SUTURE) ×3
SUT VIC AB 2-0 CT1 27 (SUTURE) ×4
SUT VIC AB 2-0 CT1 TAPERPNT 27 (SUTURE) ×2 IMPLANT
SUTURE STRATFX 0 PDS 27 VIOLET (SUTURE) ×1 IMPLANT
SYR 50ML LL SCALE MARK (SYRINGE) IMPLANT
TRAY FOLEY MTR SLVR 16FR STAT (SET/KITS/TRAYS/PACK) ×3 IMPLANT
YANKAUER SUCT BULB TIP 10FT TU (MISCELLANEOUS) ×3 IMPLANT

## 2018-12-14 NOTE — Interval H&P Note (Signed)
History and Physical Interval Note:  12/14/2018 11:40 AM  John Bishop  has presented today for surgery, with the diagnosis of right hip osteoarthritis.  The various methods of treatment have been discussed with the patient and family. After consideration of risks, benefits and other options for treatment, the patient has consented to  Procedure(s) with comments: [place] (Right) - 164min as a surgical intervention.  The patient's history has been reviewed, patient examined, no change in status, stable for surgery.  I have reviewed the patient's chart and labs.  Questions were answered to the patient's satisfaction.     Pilar Plate Danyael Alipio

## 2018-12-14 NOTE — Anesthesia Procedure Notes (Signed)
Procedure Name: MAC Date/Time: 12/14/2018 12:47 PM Performed by: West Pugh, CRNA Pre-anesthesia Checklist: Patient identified, Emergency Drugs available, Suction available, Patient being monitored and Timeout performed Patient Re-evaluated:Patient Re-evaluated prior to induction Oxygen Delivery Method: Simple face mask Preoxygenation: Pre-oxygenation with 100% oxygen Placement Confirmation: positive ETCO2 Dental Injury: Teeth and Oropharynx as per pre-operative assessment

## 2018-12-14 NOTE — Discharge Instructions (Addendum)
Dr. Gaynelle Arabian Total Joint Specialist [facility] [address], [place], [place] [address] [phone]  ANTERIOR APPROACH TOTAL HIP REPLACEMENT POSTOPERATIVE DIRECTIONS   Hip Rehabilitation, Guidelines Following Surgery  The results of a hip operation are greatly improved after range of motion and muscle strengthening exercises. Follow all safety measures which are given to protect your hip. If any of these exercises cause increased pain or swelling in your joint, decrease the amount until you are comfortable again. Then slowly increase the exercises. Call your caregiver if you have problems or questions.   HOME CARE INSTRUCTIONS   Remove items at home which could result in a fall. This includes throw rugs or furniture in walking pathways.   ICE to the affected hip every three hours for 30 minutes at a time and then as needed for pain and swelling.  Continue to use ice on the hip for pain and swelling from surgery. You may notice swelling that will progress down to the foot and ankle.  This is normal after surgery.  Elevate the leg when you are not up walking on it.    Continue to use the breathing machine which will help keep your temperature down.  It is common for your temperature to cycle up and down following surgery, especially at night when you are not up moving around and exerting yourself.  The breathing machine keeps your lungs expanded and your temperature down.  DIET You may resume your previous home diet once your are discharged from the hospital.  DRESSING / WOUND CARE / SHOWERING You may change your dressing 3-5 days after surgery.  Then change the dressing every day with sterile gauze.  Please use good hand washing techniques before changing the dressing.  Do not use any lotions or creams on the incision until instructed by your surgeon. You may start showering once you are discharged home but do not submerge the incision under water. Just pat the  incision dry and apply a dry gauze dressing on daily. Change the surgical dressing daily and reapply a dry dressing each time.  ACTIVITY Walk with your walker as instructed. Use walker as long as suggested by your caregivers. Avoid periods of inactivity such as sitting longer than an hour when not asleep. This helps prevent blood clots.  You may resume a sexual relationship in one month or when given the OK by your doctor.  You may return to work once you are cleared by your doctor.  Do not drive a car for 6 weeks or until released by you surgeon.  Do not drive while taking narcotics.  WEIGHT BEARING Weight bearing as tolerated with assist device (walker, cane, etc) as directed, use it as long as suggested by your surgeon or therapist, typically at least 4-6 weeks.  POSTOPERATIVE CONSTIPATION PROTOCOL Constipation - defined medically as fewer than three stools per week and severe constipation as less than one stool per week.  One of the most common issues patients have following surgery is constipation.  Even if you have a regular bowel pattern at home, your normal regimen is likely to be disrupted due to multiple reasons following surgery.  Combination of anesthesia, postoperative narcotics, change in appetite and fluid intake all can affect your bowels.  In order to avoid complications following surgery, here are some recommendations in order to help you during your recovery period.  Colace (docusate) - Pick up an over-the-counter form of Colace or another stool softener and take twice a day  as long as you are requiring postoperative pain medications.  Take with a full glass of water daily.  If you experience loose stools or diarrhea, hold the colace until you stool forms back up.  If your symptoms do not get better within 1 week or if they get worse, check with your doctor.  Dulcolax (bisacodyl) - Pick up over-the-counter and take as directed by the product packaging as needed to assist with  the movement of your bowels.  Take with a full glass of water.  Use this product as needed if not relieved by Colace only.   MiraLax (polyethylene glycol) - Pick up over-the-counter to have on hand.  MiraLax is a solution that will increase the amount of water in your bowels to assist with bowel movements.  Take as directed and can mix with a glass of water, juice, soda, coffee, or tea.  Take if you go more than two days without a movement. Do not use MiraLax more than once per day. Call your doctor if you are still constipated or irregular after using this medication for 7 days in a row.  If you continue to have problems with postoperative constipation, please contact the office for further assistance and recommendations.  If you experience "the worst abdominal pain ever" or develop nausea or vomiting, please contact the office immediatly for further recommendations for treatment.  ITCHING  If you experience itching with your medications, try taking only a single pain pill, or even half a pain pill at a time.  You can also use Benadryl over the counter for itching or also to help with sleep.   TED HOSE STOCKINGS Wear the elastic stockings on both legs for three weeks following surgery during the day but you may remove then at night for sleeping.  MEDICATIONS See your medication summary on the After Visit Summary that the nursing staff will review with you prior to discharge.  You may have some home medications which will be placed on hold until you complete the course of blood thinner medication.  It is important for you to complete the blood thinner medication as prescribed by your surgeon.  Continue your approved medications as instructed at time of discharge.  PRECAUTIONS If you experience chest pain or shortness of breath - call 911 immediately for transfer to the hospital emergency department.  If you develop a fever greater that 101 F, purulent drainage from wound, increased redness or  drainage from wound, foul odor from the wound/dressing, or calf pain - CONTACT YOUR SURGEON.                                                   FOLLOW-UP APPOINTMENTS Make sure you keep all of your appointments after your operation with your surgeon and caregivers. You should call the office at the above phone number and make an appointment for approximately two weeks after the date of your surgery or on the date instructed by your surgeon outlined in the "After Visit Summary".  RANGE OF MOTION AND STRENGTHENING EXERCISES  These exercises are designed to help you keep full movement of your hip joint. Follow your caregiver's or physical therapist's instructions. Perform all exercises about fifteen times, three times per day or as directed. Exercise both hips, even if you have had only one joint replacement. These exercises can be done on  a training (exercise) mat, on the floor, on a table or on a bed. Use whatever works the best and is most comfortable for you. Use music or television while you are exercising so that the exercises are a pleasant break in your day. This will make your life better with the exercises acting as a break in routine you can look forward to.   Lying on your back, slowly slide your foot toward your buttocks, raising your knee up off the floor. Then slowly slide your foot back down until your leg is straight again.   Lying on your back spread your legs as far apart as you can without causing discomfort.   Lying on your side, raise your upper leg and foot straight up from the floor as far as is comfortable. Slowly lower the leg and repeat.   Lying on your back, tighten up the muscle in the front of your thigh (quadriceps muscles). You can do this by keeping your leg straight and trying to raise your heel off the floor. This helps strengthen the largest muscle supporting your knee.   Lying on your back, tighten up the muscles of your buttocks both with the legs straight and with  the knee bent at a comfortable angle while keeping your heel on the floor.   IF YOU ARE TRANSFERRED TO A SKILLED REHAB FACILITY If the patient is transferred to a skilled rehab facility following release from the hospital, a list of the current medications will be sent to the facility for the patient to continue.  When discharged from the skilled rehab facility, please have the facility set up the patient's [place] prior to being released. Also, the skilled facility will be responsible for providing the patient with their medications at time of release from the facility to include their pain medication, the muscle relaxants, and their blood thinner medication. If the patient is still at the rehab facility at time of the two week follow up appointment, the skilled rehab facility will also need to assist the patient in arranging follow up appointment in our office and any transportation needs.  MAKE SURE YOU:   Understand these instructions.   Get help right away if you are not doing well or get worse.    Pick up stool softner and laxative for home use following surgery while on pain medications. Do not submerge incision under water. Please use good hand washing techniques while changing dressing each day. May shower starting three days after surgery. Please use a clean towel to pat the incision dry following showers. Continue to use ice for pain and swelling after surgery. Do not use any lotions or creams on the incision until instructed by your surgeon.  Information on my medicine - XARELTO (Rivaroxaban)  This medication education was reviewed with me or my healthcare representative as part of my discharge preparation.  The pharmacist that spoke with me during my hospital stay was:    Why was Xarelto prescribed for you? Xarelto was prescribed for you to reduce the risk of blood clots forming after orthopedic surgery. The medical term for these abnormal blood clots is  venous thromboembolism (VTE).  What do you need to know about xarelto ? Take your Xarelto ONCE DAILY at the same time every day. You may take it either with or without food.  If you have difficulty swallowing the tablet whole, you may crush it and mix in applesauce just prior to taking your dose.  Take Xarelto  exactly as prescribed by your doctor and DO NOT stop taking Xarelto without talking to the doctor who prescribed the medication.  Stopping without other VTE prevention medication to take the place of Xarelto may increase your risk of developing a clot.  After discharge, you should have regular check-up appointments with your healthcare provider that is prescribing your Xarelto.    What do you do if you miss a dose? If you miss a dose, take it as soon as you remember on the same day then continue your regularly scheduled once daily regimen the next day. Do not take two doses of Xarelto on the same day.   Important Safety Information A possible side effect of Xarelto is bleeding. You should call your healthcare provider right away if you experience any of the following: ? Bleeding from an injury or your nose that does not stop. ? Unusual colored urine (red or dark brown) or unusual colored stools (red or black). ? Unusual bruising for unknown reasons. ? A serious fall or if you hit your head (even if there is no bleeding).  Some medicines may interact with Xarelto and might increase your risk of bleeding while on Xarelto. To help avoid this, consult your healthcare provider or pharmacist prior to using any new prescription or non-prescription medications, including herbals, vitamins, non-steroidal anti-inflammatory drugs (NSAIDs) and supplements.  This website has more information on Xarelto: [email or web address]

## 2018-12-14 NOTE — Plan of Care (Signed)
progressing 

## 2018-12-14 NOTE — Anesthesia Procedure Notes (Signed)
Spinal  Patient location during procedure: OR Start time: 12/14/2018 12:49 PM End time: 12/14/2018 12:55 PM Reason for block: at surgeon's request Staffing Resident/CRNA: Walker, Karen L, CRNA Performed: resident/CRNA  Preanesthetic Checklist Completed: patient identified, site marked, surgical consent, pre-op evaluation, timeout performed, IV checked, risks and benefits discussed and monitors and equipment checked Spinal Block Patient position: sitting Prep: DuraPrep Patient monitoring: heart rate, continuous pulse ox and blood pressure Approach: midline Location: L3-4 Injection technique: single-shot Needle Needle type: Pencan  Needle gauge: 24 G Needle length: 9 cm Assessment Sensory level: T6 Additional Notes Expiration of kit checked and confirmed. Patient tolerated procedure well,without complications x 1 attempt with noted clear CSF. Loss of motor and sensory on exam post injection.     

## 2018-12-14 NOTE — Transfer of Care (Signed)
Immediate Anesthesia Transfer of Care Note  Patient: John Bishop  Procedure(s) Performed: TOTAL HIP ARTHROPLASTY ANTERIOR APPROACH (Right )  Patient Location: PACU  Anesthesia Type:MAC and Spinal  Level of Consciousness: awake, alert  and patient cooperative  Airway & Oxygen Therapy: Patient Spontanous Breathing and Patient connected to face mask oxygen  Post-op Assessment: Report given to RN and Post -op Vital signs reviewed and stable  Post vital signs: Reviewed and stable  Last Vitals:  Vitals Value Taken Time  BP    Temp    Pulse    Resp    SpO2      Last Pain:  Vitals:   12/14/18 1141  TempSrc: Oral  PainSc:          Complications: No apparent anesthesia complications

## 2018-12-14 NOTE — Op Note (Signed)
OPERATIVE REPORT- TOTAL HIP ARTHROPLASTY   PREOPERATIVE DIAGNOSIS: Osteoarthritis of the Right hip.   POSTOPERATIVE DIAGNOSIS: Osteoarthritis of the Right  hip.   PROCEDURE: Right total hip arthroplasty, anterior approach.   SURGEON: Gaynelle Arabian, MD   ASSISTANT: Griffith Citron, PA-C  ANESTHESIA:  Spinal  ESTIMATED BLOOD LOSS:-100 mL    DRAINS: Hemovac x1.   COMPLICATIONS: None   CONDITION: PACU - hemodynamically stable.   BRIEF CLINICAL NOTE: John Bishop is a 78 y.o. male who has advanced end-  stage arthritis of their Right  hip with progressively worsening pain and  dysfunction.The patient has failed nonoperative management and presents for  total hip arthroplasty.   PROCEDURE IN DETAIL: After successful administration of spinal  anesthetic, the traction boots for the [facility] bed were placed on both  feet and the patient was placed onto the [facility] bed, boots placed into the leg  holders. The Right hip was then isolated from the perineum with plastic  drapes and prepped and draped in the usual sterile fashion. ASIS and  greater trochanter were marked and a oblique incision was made, starting  at about 1 cm lateral and 2 cm distal to the ASIS and coursing towards  the anterior cortex of the femur. The skin was cut with a 10 blade  through subcutaneous tissue to the level of the fascia overlying the  tensor fascia lata muscle. The fascia was then incised in line with the  incision at the junction of the anterior third and posterior 2/3rd. The  muscle was teased off the fascia and then the interval between the TFL  and the rectus was developed. The Hohmann retractor was then placed at  the top of the femoral neck over the capsule. The vessels overlying the  capsule were cauterized and the fat on top of the capsule was removed.  A Hohmann retractor was then placed anterior underneath the rectus  femoris to give exposure to the entire anterior capsule. A T-shaped   capsulotomy was performed. The edges were tagged and the femoral head  was identified.       Osteophytes are removed off the superior acetabulum.  The femoral neck was then cut in situ with an oscillating saw. Traction  was then applied to the left lower extremity utilizing the [facility]  traction. The femoral head was then removed. Retractors were placed  around the acetabulum and then circumferential removal of the labrum was  performed. Osteophytes were also removed. Reaming starts at 49 mm to  medialize and  Increased in 2 mm increments to 51 mm. We reamed in  approximately 40 degrees of abduction, 20 degrees anteversion. A 52 mm  pinnacle acetabular shell was then impacted in anatomic position under  fluoroscopic guidance with excellent purchase. We did not need to place  any additional dome screws. A 32 mm neutral + 4 marathon liner was then  placed into the acetabular shell.       The femoral lift was then placed along the lateral aspect of the femur  just distal to the vastus ridge. The leg was  externally rotated and capsule  was stripped off the inferior aspect of the femoral neck down to the  level of the lesser trochanter, this was done with electrocautery. The femur was lifted after this was performed. The  leg was then placed in an extended and adducted position essentially delivering the femur. We also removed the capsule superiorly and the piriformis from the piriformis  fossa to gain excellent exposure of the  proximal femur. Rongeur was used to remove some cancellous bone to get  into the lateral portion of the proximal femur for placement of the  initial starter reamer. The starter broaches was placed  the starter broach  and was shown to go down the center of the canal. Broaching  with the Actis system was then performed starting at size 0  coursing  Up to size 4. A size 4 had excellent torsional and rotational  and axial stability. The trial standard offset neck was then  placed  with a 32 + 5 trial head. The hip was then reduced. We confirmed that  the stem was in the canal both on AP and lateral x-rays. It also has excellent sizing. The hip was reduced with outstanding stability through full extension and full external rotation.. AP pelvis was taken and the leg lengths were measured and found to be equal. Hip was then dislocated again and the femoral head and neck removed. The  femoral broach was removed. Size 4 Actis stem with a standard offset  neck was then impacted into the femur following native anteversion. Has  excellent purchase in the canal. Excellent torsional and rotational and  axial stability. It is confirmed to be in the canal on AP and lateral  fluoroscopic views. The 32 + 5 metal head was placed and the hip  reduced with outstanding stability. Again AP pelvis was taken and it  confirmed that the leg lengths were equal. The wound was then copiously  irrigated with saline solution and the capsule reattached and repaired  with Ethibond suture. 30 ml of .25% Bupivicaine was  injected into the capsule and into the edge of the tensor fascia lata as well as subcutaneous tissue. The fascia overlying the tensor fascia lata was then closed with a running #1 V-Loc. Subcu was closed with interrupted 2-0 Vicryl and subcuticular running 4-0 Monocryl. Incision was cleaned  and dried. Steri-Strips and a bulky sterile dressing applied. Hemovac  drain was hooked to suction and then the patient was awakened and transported to  recovery in stable condition.        Please note that a surgical assistant was a medical necessity for this procedure to perform it in a safe and expeditious manner. Assistant was necessary to provide appropriate retraction of vital neurovascular structures and to prevent femoral fracture and allow for anatomic placement of the prosthesis.  Gaynelle Arabian, M.D.

## 2018-12-14 NOTE — Care Plan (Signed)
Ortho Bundle Case Management Note  Patient Details  Name: John Bishop MRN: [id number] Date of Birth: 1940-07-14 Right THA on 12-14-18 DCP:  Home with spouse.  1 story home with basement and 1 ste.  DME:  No needs.  Has a RW and 3-in-1. PT:  HEP                    DME Arranged:  N/A DME Agency:     HH Arranged:    [place] Agency:     Additional Comments: Please contact me with any questions of if this plan should need to change.  Marianne Sofia, RN,CCM [facility]  [phone] 12/14/2018, 2:30 PM

## 2018-12-15 DIAGNOSIS — I129 Hypertensive chronic kidney disease with stage 1 through stage 4 chronic kidney disease, or unspecified chronic kidney disease: Secondary | ICD-10-CM | POA: Diagnosis not present

## 2018-12-15 DIAGNOSIS — R262 Difficulty in walking, not elsewhere classified: Secondary | ICD-10-CM | POA: Diagnosis not present

## 2018-12-15 DIAGNOSIS — M25751 Osteophyte, right hip: Secondary | ICD-10-CM | POA: Diagnosis not present

## 2018-12-15 DIAGNOSIS — G1225 Progressive spinal muscle atrophy: Secondary | ICD-10-CM | POA: Diagnosis not present

## 2018-12-15 DIAGNOSIS — M1611 Unilateral primary osteoarthritis, right hip: Secondary | ICD-10-CM | POA: Diagnosis not present

## 2018-12-15 DIAGNOSIS — Z1159 Encounter for screening for other viral diseases: Secondary | ICD-10-CM | POA: Diagnosis not present

## 2018-12-15 LAB — CBC
HCT: 36.6 % — ABNORMAL LOW (ref 39.0–52.0)
Hemoglobin: 11.6 g/dL — ABNORMAL LOW (ref 13.0–17.0)
MCH: 28.8 pg (ref 26.0–34.0)
MCHC: 31.7 g/dL (ref 30.0–36.0)
MCV: 90.8 fL (ref 80.0–100.0)
Platelets: 148 10*3/uL — ABNORMAL LOW (ref 150–400)
RBC: 4.03 MIL/uL — ABNORMAL LOW (ref 4.22–5.81)
RDW: 13.7 % (ref 11.5–15.5)
WBC: 9.5 10*3/uL (ref 4.0–10.5)
nRBC: 0 % (ref 0.0–0.2)

## 2018-12-15 LAB — BASIC METABOLIC PANEL
Anion gap: 8 (ref 5–15)
BUN: 23 mg/dL (ref 8–23)
CO2: 20 mmol/L — ABNORMAL LOW (ref 22–32)
Calcium: 8.5 mg/dL — ABNORMAL LOW (ref 8.9–10.3)
Chloride: 103 mmol/L (ref 98–111)
Creatinine, Ser: 1.45 mg/dL — ABNORMAL HIGH (ref 0.61–1.24)
GFR calc Af Amer: 53 mL/min — ABNORMAL LOW (ref >60)
GFR calc non Af Amer: 46 mL/min — ABNORMAL LOW (ref >60)
Glucose, Bld: 313 mg/dL — ABNORMAL HIGH (ref 70–99)
Potassium: 5 mmol/L (ref 3.5–5.1)
Sodium: 131 mmol/L — ABNORMAL LOW (ref 135–145)

## 2018-12-15 LAB — GLUCOSE, CAPILLARY
Glucose-Capillary: 296 mg/dL — ABNORMAL HIGH (ref 70–99)
Glucose-Capillary: 209 mg/dL — ABNORMAL HIGH (ref 70–99)

## 2018-12-15 MED ORDER — TRAMADOL HCL 50 MG PO TABS: 50.0000 mg | ORAL_TABLET | Freq: Four times a day (QID) | ORAL | 0 refills | Status: DC | PRN

## 2018-12-15 MED ORDER — HYDROCODONE-ACETAMINOPHEN 5-325 MG PO TABS: 1.0000 | ORAL_TABLET | Freq: Four times a day (QID) | ORAL | 0 refills | Status: DC | PRN

## 2018-12-15 MED ORDER — RIVAROXABAN 10 MG PO TABS: 10.0000 mg | ORAL_TABLET | Freq: Every day | ORAL | 0 refills | Status: DC

## 2018-12-15 MED ORDER — METHOCARBAMOL 500 MG PO TABS: 500.0000 mg | ORAL_TABLET | Freq: Four times a day (QID) | ORAL | 0 refills | Status: DC | PRN

## 2018-12-15 NOTE — Care Management CC44 (Signed)
Condition Code 44 Documentation Completed  Patient Details  Name: NOA CONSTANTE MRN: [id number] Date of Birth: 1940/11/20   Condition Code 44 given:  Yes Patient signature on Condition Code 44 notice:  Yes Documentation of 2 MD's agreement:  Yes Code 44 added to claim:  Yes    Lia Hopping, [place] 12/15/2018, 11:28 AM

## 2018-12-15 NOTE — Progress Notes (Signed)
   Subjective: 1 Day Post-Op Procedure(s) (LRB): TOTAL HIP ARTHROPLASTY ANTERIOR APPROACH (Right) Patient reports pain as mild.   Patient seen in rounds with Dr. Wynelle Link. Patient is well, and has had no acute complaints or problems. No issues overnight, states he is ready to go home. Denies chest pain or SOB. Foley catheter removed this AM.  We will begin therapy today.   Objective: Vital signs in last 24 hours: Temp:  [96.3 F (35.7 C)-98.7 F (37.1 C)] 97.3 F (36.3 C) (07/09 0428) Pulse Rate:  [52-86] 69 (07/09 0428) Resp:  [9-21] 18 (07/09 0428) BP: (121-178)/(65-95) 158/79 (07/09 0428) SpO2:  [98 %-100 %] 100 % (07/09 0428) Weight:  [92.4 kg] 92.4 kg (07/08 1125)  Intake/Output from previous day:  Intake/Output Summary (Last 24 hours) at 12/15/2018 0755 Last data filed at 12/15/2018 0700 Gross per 24 hour  Intake 3926.67 ml  Output 2365 ml  Net 1561.67 ml    Labs: Recent Labs    12/15/18 0250  HGB 11.6*   Recent Labs    12/15/18 0250  WBC 9.5  RBC 4.03*  HCT 36.6*  PLT 148*   Recent Labs    12/15/18 0250  NA 131*  K 5.0  CL 103  CO2 20*  BUN 23  CREATININE 1.45*  GLUCOSE 313*  CALCIUM 8.5*   Exam: General - Patient is Alert and Oriented Extremity - Neurologically intact Neurovascular intact Sensation intact distally Dorsiflexion/Plantar flexion intact Dressing - dressing C/D/I Motor Function - intact, moving foot and toes well on exam.   Past Medical History:  Diagnosis Date  . Arthritis   . Diabetes mellitus, type II ([place])   . Fatty liver   . Hyperlipidemia   . Hypertension   . Kidney disease   . Progressive muscular atrophy ([place]) 04/27/2018    Assessment/Plan: 1 Day Post-Op Procedure(s) (LRB): TOTAL HIP ARTHROPLASTY ANTERIOR APPROACH (Right) Principal Problem:   OA (osteoarthritis) of hip  Estimated body mass index is 29.21 kg/m as calculated from the following:   Height as of this encounter: 5\' 10"  (1.778 m).   Weight as of this  encounter: 92.4 kg. Advance diet Up with therapy D/C IV fluids  DVT Prophylaxis - Xarelto Weight bearing as tolerated. D/C O2 and pulse ox and try on room air. Hemovac pulled without difficulty, will begin therapy.  Plan is to go Home after hospital stay. Plan for discharge later today with HEP as long as meeting goals with physical therapy. Follow-up in the office in 2 weeks.   Theresa Duty, PA-C Orthopedic Surgery 12/15/2018, 7:55 AM

## 2018-12-15 NOTE — Care Management Obs Status (Signed)
[place] NOTIFICATION   Patient Details  Name: John Bishop MRN: [id number] Date of Birth: 1940-06-29   Medicare Observation Status Notification Given:  Yes    Lia Hopping, [place] 12/15/2018, 11:28 AM

## 2018-12-15 NOTE — Evaluation (Signed)
Physical Therapy Evaluation Patient Details Name: John Bishop MRN: [id number] DOB: 19-Feb-1941 Today's Date: 12/15/2018   History of Present Illness  Pt s/p R THR and with hx of DM  Clinical Impression  Pt s/p R THR and presents with decreased R LE strength/ROM and post op pain limiting functional mobility.  Pt should progress to dc home with family assist.    Follow Up Recommendations Follow surgeon's recommendation for DC plan and follow-up therapies    Equipment Recommendations  None recommended by PT    Recommendations for Other Services       Precautions / Restrictions Precautions Precautions: Fall Restrictions Weight Bearing Restrictions: No Other Position/Activity Restrictions: WBAT      Mobility  Bed Mobility Overal bed mobility: Needs Assistance Bed Mobility: Supine to Sit     Supine to sit: Min assist;Min guard     General bed mobility comments: cues for sequence and use of L LE to self assist  Transfers Overall transfer level: Needs assistance Equipment used: Rolling walker (2 wheeled) Transfers: Sit to/from Stand Sit to Stand: Min assist;Min guard         General transfer comment: cues for LE management and use of UEs to self assist  Ambulation/Gait Ambulation/Gait assistance: Min assist;Min guard Gait Distance (Feet): 160 Feet Assistive device: Rolling walker (2 wheeled) Gait Pattern/deviations: Step-to pattern;Step-through pattern;Decreased step length - right;Decreased step length - left;Shuffle;Trunk flexed Gait velocity: decr   General Gait Details: cues for sequence, posture and position from [employer]            Wheelchair Mobility    Modified Rankin (Stroke Patients Only)       Balance Overall balance assessment: Mild deficits observed, not formally tested                                           Pertinent Vitals/Pain Pain Assessment: 0-10 Pain Score: 3  Pain Location: r HIP Pain Descriptors /  Indicators: Aching;Sore Pain Intervention(s): Limited activity within patient's tolerance;Monitored during session;Premedicated before session;Ice applied    Home Living Family/patient expects to be discharged to:: Private residence Living Arrangements: Spouse/significant other Available Help at Discharge: Family Type of Home: House Home Access: Stairs to enter   Technical brewer of Steps: 1 Home Layout: Two level Home Equipment: Environmental consultant - 2 wheels;Walker - standard;Bedside commode      Prior Function Level of Independence: Independent               Hand Dominance        Extremity/Trunk Assessment   Upper Extremity Assessment Upper Extremity Assessment: Overall WFL for tasks assessed    Lower Extremity Assessment Lower Extremity Assessment: RLE deficits/detail RLE Deficits / Details: Strength at hip 2/5 with AAROM at hip to 90 flex and 20 abd    Cervical / Trunk Assessment Cervical / Trunk Assessment: Normal  Communication   Communication: No difficulties  Cognition Arousal/Alertness: Awake/alert Behavior During Therapy: WFL for tasks assessed/performed Overall Cognitive Status: Within Functional Limits for tasks assessed                                        General Comments      Exercises Total Joint Exercises Ankle Circles/Pumps: AROM;Both;20 reps;Supine Quad Sets: AROM;Both;10 reps;Supine Heel Slides: AAROM;Right;20 reps;Supine  Hip ABduction/ADduction: AAROM;Right;15 reps;Supine Long Arc Quad: AROM;Right;10 reps;Seated   Assessment/Plan    PT Assessment Patient needs continued PT services  PT Problem List Decreased strength;Decreased range of motion;Decreased activity tolerance;Decreased mobility;Decreased knowledge of use of DME;Pain       PT Treatment Interventions DME instruction;Gait training;Stair training;Functional mobility training;Therapeutic activities;Therapeutic exercise;Patient/family education    PT Goals  (Current goals can be found in the Care Plan section)  Acute Rehab PT Goals Patient Stated Goal: Regain IND PT Goal Formulation: With patient Time For Goal Achievement: 12/22/18 Potential to Achieve Goals: Good    Frequency 7X/week   Barriers to discharge        Co-evaluation               AM-PAC PT "6 Clicks" Mobility  Outcome Measure Help needed turning from your back to your side while in a flat bed without using bedrails?: A Little Help needed moving from lying on your back to sitting on the side of a flat bed without using bedrails?: A Little Help needed moving to and from a bed to a chair (including a wheelchair)?: A Little Help needed standing up from a chair using your arms (e.g., wheelchair or bedside chair)?: A Little Help needed to walk in hospital room?: A Little Help needed climbing 3-5 steps with a railing? : A Little 6 Click Score: 18    End of Session Equipment Utilized During Treatment: Gait belt Activity Tolerance: Patient tolerated treatment well Patient left: in chair;with call bell/phone within reach;with chair alarm set Nurse Communication: Mobility status PT Visit Diagnosis: Difficulty in walking, not elsewhere classified (R26.2)    Time: 0947-0962 PT Time Calculation (min) (ACUTE ONLY): 32 min   Charges:   PT Evaluation $PT Eval Low Complexity: 1 Low PT Treatments $Therapeutic Exercise: 8-22 mins        Debe Coder PT Acute Rehabilitation Services Pager [phone] Office [phone]   Rorey Hodges 12/15/2018, 12:27 PM

## 2018-12-15 NOTE — Progress Notes (Signed)
Physical Therapy Treatment Patient Details Name: John Bishop MRN: [id number] DOB: 1940-11-14 Today's Date: 12/15/2018    History of Present Illness Pt s/p R THR and with hx of DM    PT Comments    Pt progressing well with mobility and reviewed car transfers, stairs and home therex program with progression and written instruction provided.  Follow Up Recommendations  Follow surgeon's recommendation for DC plan and follow-up therapies     Equipment Recommendations  None recommended by PT    Recommendations for Other Services       Precautions / Restrictions Precautions Precautions: Fall Restrictions Weight Bearing Restrictions: No Other Position/Activity Restrictions: WBAT    Mobility  Bed Mobility Overal bed mobility: Needs Assistance Bed Mobility: Supine to Sit     Supine to sit: Min assist;Min guard     General bed mobility comments: Pt up in chair and requests back to same  Transfers Overall transfer level: Needs assistance Equipment used: Rolling walker (2 wheeled) Transfers: Sit to/from Stand Sit to Stand: Min guard;Supervision         General transfer comment: cues for LE management and use of UEs to self assist  Ambulation/Gait Ambulation/Gait assistance: Min guard;Supervision Gait Distance (Feet): 200 Feet Assistive device: Rolling walker (2 wheeled) Gait Pattern/deviations: Step-to pattern;Step-through pattern;Decreased step length - right;Decreased step length - left;Shuffle;Trunk flexed Gait velocity: decr   General Gait Details: cues for sequence, posture and position from RW   Stairs Stairs: Yes Stairs assistance: Min assist Stair Management: Two rails;No rails;Forwards;With walker Number of Stairs: 7 General stair comments: single steip twice fwd with RW; 5 stepd with bilat rails.  Cues for sequence, and foot placement   Wheelchair Mobility    Modified Rankin (Stroke Patients Only)       Balance Overall balance assessment:  Mild deficits observed, not formally tested                                          Cognition Arousal/Alertness: Awake/alert Behavior During Therapy: WFL for tasks assessed/performed Overall Cognitive Status: Within Functional Limits for tasks assessed                                        Exercises Total Joint Exercises Ankle Circles/Pumps: AROM;Both;20 reps;Supine Quad Sets: AROM;Both;10 reps;Supine Heel Slides: AAROM;Right;20 reps;Supine Hip ABduction/ADduction: AAROM;Right;15 reps;Supine Long Arc Quad: AROM;Right;10 reps;Seated    General Comments        Pertinent Vitals/Pain Pain Assessment: 0-10 Pain Score: 3  Pain Location: r HIP Pain Descriptors / Indicators: Aching;Sore Pain Intervention(s): Limited activity within patient's tolerance;Monitored during session;Premedicated before session;Ice applied    Home Living Family/patient expects to be discharged to:: Private residence Living Arrangements: Spouse/significant other Available Help at Discharge: Family Type of Home: House Home Access: Stairs to enter   Home Layout: Two level Home Equipment: Environmental consultant - 2 wheels;Walker - standard;Bedside commode      Prior Function Level of Independence: Independent          PT Goals (current goals can now be found in the care plan section) Acute Rehab PT Goals Patient Stated Goal: Regain IND PT Goal Formulation: With patient Time For Goal Achievement: 12/22/18 Potential to Achieve Goals: Good Progress towards PT goals: Progressing toward goals    Frequency  7X/week      PT Plan Current plan remains appropriate    Co-evaluation              AM-PAC PT "6 Clicks" Mobility   Outcome Measure  Help needed turning from your back to your side while in a flat bed without using bedrails?: A Little Help needed moving from lying on your back to sitting on the side of a flat bed without using bedrails?: A Little Help needed  moving to and from a bed to a chair (including a wheelchair)?: A Little Help needed standing up from a chair using your arms (e.g., wheelchair or bedside chair)?: A Little Help needed to walk in hospital room?: A Little Help needed climbing 3-5 steps with a railing? : A Little 6 Click Score: 18    End of Session Equipment Utilized During Treatment: Gait belt Activity Tolerance: Patient tolerated treatment well Patient left: in chair;with call bell/phone within reach;with chair alarm set Nurse Communication: Mobility status PT Visit Diagnosis: Difficulty in walking, not elsewhere classified (R26.2)     Time: 4585-9292 PT Time Calculation (min) (ACUTE ONLY): 26 min  Charges:  $Gait Training: 8-22 mins $Therapeutic Exercise: 8-22 mins $Therapeutic Activity: 8-22 mins                     [place] Pager [phone] Office [phone]    Dahiana Kulak 12/15/2018, 12:34 PM

## 2018-12-16 NOTE — Anesthesia Postprocedure Evaluation (Signed)
Anesthesia Post Note  Patient: John Bishop  Procedure(s) Performed: TOTAL HIP ARTHROPLASTY ANTERIOR APPROACH (Right )     Patient location during evaluation: PACU Anesthesia Type: Spinal Level of consciousness: awake Pain management: pain level controlled Vital Signs Assessment: post-procedure vital signs reviewed and stable Respiratory status: spontaneous breathing Cardiovascular status: stable Postop Assessment: no apparent nausea or vomiting, no headache, no backache and spinal receding Anesthetic complications: no    Last Vitals:  Vitals:   12/15/18 0428 12/15/18 1026  BP: (!) 158/79 (!) 165/74  Pulse: 69 73  Resp: 18 16  Temp: (!) 36.3 C 36.8 C  SpO2: 100% 100%    Last Pain:  Vitals:   12/15/18 1026  TempSrc: Oral  PainSc:    Pain Goal: Patients Stated Pain Goal: 2 (12/15/18 0937)                 Huston Foley

## 2018-12-19 ENCOUNTER — Encounter (HOSPITAL_COMMUNITY): Payer: Self-pay | Admitting: Orthopedic Surgery

## 2018-12-19 NOTE — Discharge Summary (Signed)
Physician Discharge Summary   Patient ID: CATO LIBURD MRN: [id number] DOB/AGE: January 10, 1941 78 y.o.  Admit date: 12/14/2018 Discharge date: 12/15/2018  Primary Diagnosis: Osteoarthritis, right hip   Admission Diagnoses:  Past Medical History:  Diagnosis Date  . Arthritis   . Diabetes mellitus, type II ([place])   . Fatty liver   . Hyperlipidemia   . Hypertension   . Kidney disease   . Progressive muscular atrophy ([place]) 04/27/2018   Discharge Diagnoses:   Principal Problem:   OA (osteoarthritis) of hip  Estimated body mass index is 29.21 kg/m as calculated from the following:   Height as of this encounter: 5\' 10"  (1.778 m).   Weight as of this encounter: 92.4 kg.  Procedure:  Procedure(s) (LRB): TOTAL HIP ARTHROPLASTY ANTERIOR APPROACH (Right)   Consults: None  HPI: John Bishop is a 78 y.o. male who has advanced end-stage arthritis of their Right hip with progressively worsening pain and dysfunction.The patient has failed nonoperative management and presents for total hip arthroplasty.    Laboratory Data: Admission on 12/14/2018, Discharged on 12/15/2018  Component Date Value Ref Range Status  . Glucose-Capillary 12/14/2018 138* 70 - 99 mg/dL Final  . Glucose-Capillary 12/14/2018 106* 70 - 99 mg/dL Final  . WBC 12/15/2018 9.5  4.0 - 10.5 K/uL Final  . RBC 12/15/2018 4.03* 4.22 - 5.81 MIL/uL Final  . Hemoglobin 12/15/2018 11.6* 13.0 - 17.0 g/dL Final  . HCT 12/15/2018 36.6* 39.0 - 52.0 % Final  . MCV 12/15/2018 90.8  80.0 - 100.0 fL Final  . MCH 12/15/2018 28.8  26.0 - 34.0 pg Final  . MCHC 12/15/2018 31.7  30.0 - 36.0 g/dL Final  . RDW 12/15/2018 13.7  11.5 - 15.5 % Final  . Platelets 12/15/2018 148* 150 - 400 K/uL Final  . nRBC 12/15/2018 0.0  0.0 - 0.2 % Final   Performed at [facility], [place] [address] [place], [place] [address]  . Sodium 12/15/2018 131* 135 - 145 mmol/L Final  . Potassium 12/15/2018 5.0  3.5 - 5.1 mmol/L Final  . Chloride  12/15/2018 103  98 - 111 mmol/L Final  . CO2 12/15/2018 20* 22 - 32 mmol/L Final  . Glucose, Bld 12/15/2018 313* 70 - 99 mg/dL Final  . BUN 12/15/2018 23  8 - 23 mg/dL Final  . Creatinine, Ser 12/15/2018 1.45* 0.61 - 1.24 mg/dL Final  . Calcium 12/15/2018 8.5* 8.9 - 10.3 mg/dL Final  . GFR calc non Af Amer 12/15/2018 46* >60 mL/min Final  . GFR calc Af Amer 12/15/2018 53* >60 mL/min Final  . Anion gap 12/15/2018 8  5 - 15 Final   Performed at [facility], [place] [address] [place],  [address]  . Glucose-Capillary 12/14/2018 166* 70 - 99 mg/dL Final  . Glucose-Capillary 12/14/2018 284* 70 - 99 mg/dL Final  . Glucose-Capillary 12/15/2018 296* 70 - 99 mg/dL Final  . Glucose-Capillary 12/15/2018 209* 70 - 99 mg/dL Final  Hospital Outpatient Visit on 12/09/2018  Component Date Value Ref Range Status  . SARS Coronavirus 2 12/09/2018 NEGATIVE  NEGATIVE Final   Comment: (NOTE) SARS-CoV-2 target nucleic acids are NOT DETECTED. The SARS-CoV-2 RNA is generally detectable in upper and lower respiratory specimens during the acute phase of infection. Negative results do not preclude SARS-CoV-2 infection, do not rule out co-infections with other pathogens, and should not be used as the sole basis for treatment or other patient management decisions. Negative results must be combined with clinical observations,  patient history, and epidemiological information. The expected result is Negative. Fact Sheet for Patients: SugarRoll.be Fact Sheet for Healthcare Providers: https://www.woods-mathews.com/ This test is not yet approved or cleared by the [place] FDA and  has been authorized for detection and/or diagnosis of SARS-CoV-2 by FDA under an Emergency Use Authorization (EUA). This EUA will remain  in effect (meaning this test can be used) for the duration of the COVID-19 declaration under Section 56                           4(b)(1) of the Act, 21 U.S.C. section 360bbb-3(b)(1), unless the authorization is terminated or revoked sooner. Performed at [facility], [place] [address] [place], [place] [address]   Hospital Outpatient Visit on 12/07/2018  Component Date Value Ref Range Status  . aPTT 12/07/2018 26  24 - 36 seconds Final   Performed at [facility], [place] [address], [place], [place] [address]  . WBC 12/07/2018 6.2  4.0 - 10.5 K/uL Final  . RBC 12/07/2018 4.49  4.22 - 5.81 MIL/uL Final  . Hemoglobin 12/07/2018 13.0  13.0 - 17.0 g/dL Final  . HCT 12/07/2018 41.5  39.0 - 52.0 % Final  . MCV 12/07/2018 92.4  80.0 - 100.0 fL Final  . MCH 12/07/2018 29.0  26.0 - 34.0 pg Final  . MCHC 12/07/2018 31.3  30.0 - 36.0 g/dL Final  . RDW 12/07/2018 14.2  11.5 - 15.5 % Final  . Platelets 12/07/2018 178  150 - 400 K/uL Final  . nRBC 12/07/2018 0.0  0.0 - 0.2 % Final   Performed at [facility], [place] [address], [place], [place] [address]  . Sodium 12/07/2018 142  135 - 145 mmol/L Final  . Potassium 12/07/2018 4.7  3.5 - 5.1 mmol/L Final  . Chloride 12/07/2018 108  98 - 111 mmol/L Final  . CO2 12/07/2018 27  22 - 32 mmol/L Final  . Glucose, Bld 12/07/2018 80  70 - 99 mg/dL Final  . BUN 12/07/2018 23  8 - 23 mg/dL Final  . Creatinine, Ser 12/07/2018 1.64* 0.61 - 1.24 mg/dL Final  . Calcium 12/07/2018 9.6  8.9 - 10.3 mg/dL Final  . Total Protein 12/07/2018 7.4  6.5 - 8.1 g/dL Final  . Albumin 12/07/2018 4.0  3.5 - 5.0 g/dL Final  . AST 12/07/2018 24  15 - 41 U/L Final  . ALT 12/07/2018 27  0 - 44 U/L Final  . Alkaline Phosphatase 12/07/2018 86  38 - 126 U/L Final  . Total Bilirubin 12/07/2018 0.8  0.3 - 1.2 mg/dL Final  . GFR calc non Af Amer 12/07/2018 40* >60 mL/min Final  . GFR calc Af Amer 12/07/2018 46* >60 mL/min Final  . Anion gap 12/07/2018 7  5 - 15 Final   Performed at [facility], [place] [address] [place], [place] [address]  . Prothrombin  Time 12/07/2018 13.4  11.4 - 15.2 seconds Final  . INR 12/07/2018 1.0  0.8 - 1.2 Final   Comment: (NOTE) INR goal varies based on device and disease states. Performed at [facility], [place] [address] [place], [place] [address]   . ABO/RH(D) 12/07/2018 A POS   Final  . Antibody Screen 12/07/2018 NEG   Final  . Sample Expiration 12/07/2018 12/17/2018,2359   Final  . Extend sample reason 12/07/2018    Final  Value:NO TRANSFUSIONS OR PREGNANCY IN THE PAST 3 MONTHS Performed at [place] [address], [place], [place] [address]   . MRSA, PCR 12/07/2018 NEGATIVE  NEGATIVE Final  . Staphylococcus aureus 12/07/2018 NEGATIVE  NEGATIVE Final   Comment: (NOTE) The Xpert SA Assay (FDA approved for NASAL specimens in patients 37 years of age and older), is one component of a comprehensive surveillance program. It is not intended to diagnose infection nor to guide or monitor treatment. Performed at [facility], [place] [address] [place], [place] [address]   . Hgb A1c MFr Bld 12/07/2018 7.5* 4.8 - 5.6 % Final   Comment: (NOTE) Pre diabetes:          5.7%-6.4% Diabetes:              >6.4% Glycemic control for   <7.0% adults with diabetes   . Mean Plasma Glucose 12/07/2018 168.55  mg/dL Final   Performed at [place] [address] [place], [place] [address]  . Glucose-Capillary 12/07/2018 85  70 - 99 mg/dL Final  . ABO/RH(D) 12/07/2018    Final                   Value:A POS Performed at [facility], [place] [address], [place], [place] [address]      X-Rays:Dg Pelvis Portable  Result Date: 12/14/2018 CLINICAL DATA:  Status post total right hip arthroplasty. EXAM: PORTABLE PELVIS 1-2 VIEWS COMPARISON:  None. FINDINGS: Well seated components of a total right hip arthroplasty without complicating features. Bony density along the inferomedial margin of the acetabular cup is likely a spur.  The pubic symphysis and SI joints are intact. No pelvic fractures or bone lesions. IMPRESSION: Well seated components of a total hip arthroplasty without complicating features. Electronically Signed   By: Marijo Sanes M.D.   On: 12/14/2018 15:30   Dg C-arm 1-60 Min-no Report  Result Date: 12/14/2018 CLINICAL DATA:  Portable imaging from a right total hip arthroplasty. EXAM: DG HIP (WITH OR WITHOUT PELVIS) 1V PORT RIGHT; DG C-ARM 1-60 MIN-NO REPORT COMPARISON:  None. FINDINGS: Six submitted images show placement of a total right hip arthroplasty. The femoral and acetabular components appear well seated and well aligned. No evidence of an acute fracture or operative complication. IMPRESSION: Well-positioned total right hip arthroplasty. Electronically Signed   By: Lajean Manes M.D.   On: 12/14/2018 14:30   Dg Hip Port Unilat With Pelvis 1v Right  Result Date: 12/14/2018 CLINICAL DATA:  Portable imaging from a right total hip arthroplasty. EXAM: DG HIP (WITH OR WITHOUT PELVIS) 1V PORT RIGHT; DG C-ARM 1-60 MIN-NO REPORT COMPARISON:  None. FINDINGS: Six submitted images show placement of a total right hip arthroplasty. The femoral and acetabular components appear well seated and well aligned. No evidence of an acute fracture or operative complication. IMPRESSION: Well-positioned total right hip arthroplasty. Electronically Signed   By: Lajean Manes M.D.   On: 12/14/2018 14:30    EKG: Orders placed or performed in visit on 02/22/18  . EKG 12-Lead     Hospital Course: IRVING BLOOR is a 78 y.o. who was admitted to [facility]. They were brought to the operating room on 12/14/2018 and underwent Procedure(s): [place].  Patient tolerated the procedure well and was later transferred to the recovery room and then to the orthopaedic floor for postoperative care. They were given PO and IV analgesics for pain control following their  surgery. They were given 24 hours of  postoperative antibiotics of  Anti-infectives (From admission, onward)   Start     Dose/Rate Route Frequency Ordered Stop   12/14/18 1930  ceFAZolin (ANCEF) IVPB 2g/100 mL premix     2 g 200 mL/hr over 30 Minutes Intravenous Every 6 hours 12/14/18 1554 12/15/18 0208   12/14/18 1130  ceFAZolin (ANCEF) IVPB 2g/100 mL premix     2 g 200 mL/hr over 30 Minutes Intravenous On call to O.R. 12/14/18 1124 12/14/18 1326     and started on DVT prophylaxis in the form of Xarelto.   PT and OT were ordered for total joint protocol. Discharge planning consulted to help with postop disposition and equipment needs.  Patient had a good night on the evening of surgery. They started to get up OOB with therapy on POD #1. Pt was seen during rounds and was ready to go home pending progress with therapy. Hemovac drain was pulled without difficulty. He worked with therapy on POD #1 and was meeting his goals. Pt was discharged to home later that day in stable condition.  Diet: Diabetic diet Activity: WBAT Follow-up: in 2 weeks Disposition: Home with HEP Discharged Condition: stable   Discharge Instructions    Call MD / Call 911   Complete by: As directed    If you experience chest pain or shortness of breath, CALL 911 and be transported to the hospital emergency room.  If you develope a fever above 101 F, pus (white drainage) or increased drainage or redness at the wound, or calf pain, call your surgeon's office.   Change dressing   Complete by: As directed    You may change your dressing on Friday, then change the dressing daily with sterile 4 x 4 inch gauze dressing and paper tape.   Constipation Prevention   Complete by: As directed    Drink plenty of fluids.  Prune juice may be helpful.  You may use a stool softener, such as Colace (over the counter) 100 mg twice a day.  Use MiraLax (over the counter) for constipation as needed.   Diet - low sodium heart healthy   Complete by: As directed    Discharge  instructions   Complete by: As directed    Dr. Gaynelle Arabian Total Joint Specialist [facility] [address], [place], [place] [address] [phone]  ANTERIOR APPROACH TOTAL HIP REPLACEMENT POSTOPERATIVE DIRECTIONS   Hip Rehabilitation, Guidelines Following Surgery  The results of a hip operation are greatly improved after range of motion and muscle strengthening exercises. Follow all safety measures which are given to protect your hip. If any of these exercises cause increased pain or swelling in your joint, decrease the amount until you are comfortable again. Then slowly increase the exercises. Call your caregiver if you have problems or questions.   HOME CARE INSTRUCTIONS  Remove items at home which could result in a fall. This includes throw rugs or furniture in walking pathways.  ICE to the affected hip every three hours for 30 minutes at a time and then as needed for pain and swelling.  Continue to use ice on the hip for pain and swelling from surgery. You may notice swelling that will progress down to the foot and ankle.  This is normal after surgery.  Elevate the leg when you are not up walking on it.   Continue to use the breathing machine which will help keep your temperature down.  It is common  for your temperature to cycle up and down following surgery, especially at night when you are not up moving around and exerting yourself.  The breathing machine keeps your lungs expanded and your temperature down.  DIET You may resume your previous home diet once your are discharged from the hospital.  DRESSING / WOUND CARE / SHOWERING You may change your dressing 3-5 days after surgery.  Then change the dressing every day with sterile gauze.  Please use good hand washing techniques before changing the dressing.  Do not use any lotions or creams on the incision until instructed by your surgeon. You may start showering once you are discharged home but do not submerge the  incision under water. Just pat the incision dry and apply a dry gauze dressing on daily. Change the surgical dressing daily and reapply a dry dressing each time.  ACTIVITY Walk with your walker as instructed. Use walker as long as suggested by your caregivers. Avoid periods of inactivity such as sitting longer than an hour when not asleep. This helps prevent blood clots.  You may resume a sexual relationship in one month or when given the OK by your doctor.  You may return to work once you are cleared by your doctor.  Do not drive a car for 6 weeks or until released by you surgeon.  Do not drive while taking narcotics.  WEIGHT BEARING Weight bearing as tolerated with assist device (walker, cane, etc) as directed, use it as long as suggested by your surgeon or therapist, typically at least 4-6 weeks.  POSTOPERATIVE CONSTIPATION PROTOCOL Constipation - defined medically as fewer than three stools per week and severe constipation as less than one stool per week.  One of the most common issues patients have following surgery is constipation.  Even if you have a regular bowel pattern at home, your normal regimen is likely to be disrupted due to multiple reasons following surgery.  Combination of anesthesia, postoperative narcotics, change in appetite and fluid intake all can affect your bowels.  In order to avoid complications following surgery, here are some recommendations in order to help you during your recovery period.  Colace (docusate) - Pick up an over-the-counter form of Colace or another stool softener and take twice a day as long as you are requiring postoperative pain medications.  Take with a full glass of water daily.  If you experience loose stools or diarrhea, hold the colace until you stool forms back up.  If your symptoms do not get better within 1 week or if they get worse, check with your doctor.  Dulcolax (bisacodyl) - Pick up over-the-counter and take as directed by the product  packaging as needed to assist with the movement of your bowels.  Take with a full glass of water.  Use this product as needed if not relieved by Colace only.   MiraLax (polyethylene glycol) - Pick up over-the-counter to have on hand.  MiraLax is a solution that will increase the amount of water in your bowels to assist with bowel movements.  Take as directed and can mix with a glass of water, juice, soda, coffee, or tea.  Take if you go more than two days without a movement. Do not use MiraLax more than once per day. Call your doctor if you are still constipated or irregular after using this medication for 7 days in a row.  If you continue to have problems with postoperative constipation, please contact the office for further assistance and recommendations.  If you experience "the worst abdominal pain ever" or develop nausea or vomiting, please contact the office immediatly for further recommendations for treatment.  ITCHING  If you experience itching with your medications, try taking only a single pain pill, or even half a pain pill at a time.  You can also use Benadryl over the counter for itching or also to help with sleep.   TED HOSE STOCKINGS Wear the elastic stockings on both legs for three weeks following surgery during the day but you may remove then at night for sleeping.  MEDICATIONS See your medication summary on the "After Visit Summary" that the nursing staff will review with you prior to discharge.  You may have some home medications which will be placed on hold until you complete the course of blood thinner medication.  It is important for you to complete the blood thinner medication as prescribed by your surgeon.  Continue your approved medications as instructed at time of discharge.  PRECAUTIONS If you experience chest pain or shortness of breath - call 911 immediately for transfer to the hospital emergency department.  If you develop a fever greater that 101 F, purulent drainage  from wound, increased redness or drainage from wound, foul odor from the wound/dressing, or calf pain - CONTACT YOUR SURGEON.                                                   FOLLOW-UP APPOINTMENTS Make sure you keep all of your appointments after your operation with your surgeon and caregivers. You should call the office at the above phone number and make an appointment for approximately two weeks after the date of your surgery or on the date instructed by your surgeon outlined in the "After Visit Summary".  RANGE OF MOTION AND STRENGTHENING EXERCISES  These exercises are designed to help you keep full movement of your hip joint. Follow your caregiver's or physical therapist's instructions. Perform all exercises about fifteen times, three times per day or as directed. Exercise both hips, even if you have had only one joint replacement. These exercises can be done on a training (exercise) mat, on the floor, on a table or on a bed. Use whatever works the best and is most comfortable for you. Use music or television while you are exercising so that the exercises are a pleasant break in your day. This will make your life better with the exercises acting as a break in routine you can look forward to.  Lying on your back, slowly slide your foot toward your buttocks, raising your knee up off the floor. Then slowly slide your foot back down until your leg is straight again.  Lying on your back spread your legs as far apart as you can without causing discomfort.  Lying on your side, raise your upper leg and foot straight up from the floor as far as is comfortable. Slowly lower the leg and repeat.  Lying on your back, tighten up the muscle in the front of your thigh (quadriceps muscles). You can do this by keeping your leg straight and trying to raise your heel off the floor. This helps strengthen the largest muscle supporting your knee.  Lying on your back, tighten up the muscles of your buttocks both with the  legs straight and with the knee bent at a comfortable angle while  keeping your heel on the floor.   IF YOU ARE TRANSFERRED TO A SKILLED REHAB FACILITY If the patient is transferred to a skilled rehab facility following release from the hospital, a list of the current medications will be sent to the facility for the patient to continue.  When discharged from the skilled rehab facility, please have the facility set up the patient's [place] prior to being released. Also, the skilled facility will be responsible for providing the patient with their medications at time of release from the facility to include their pain medication, the muscle relaxants, and their blood thinner medication. If the patient is still at the rehab facility at time of the two week follow up appointment, the skilled rehab facility will also need to assist the patient in arranging follow up appointment in our office and any transportation needs.  MAKE SURE YOU:  Understand these instructions.  Get help right away if you are not doing well or get worse.    Pick up stool softner and laxative for home use following surgery while on pain medications. Do not submerge incision under water. Please use good hand washing techniques while changing dressing each day. May shower starting three days after surgery. Please use a clean towel to pat the incision dry following showers. Continue to use ice for pain and swelling after surgery. Do not use any lotions or creams on the incision until instructed by your surgeon.   Do not sit on low chairs, stoools or toilet seats, as it may be difficult to get up from low surfaces   Complete by: As directed    Driving restrictions   Complete by: As directed    No driving for two weeks   TED hose   Complete by: As directed    Use stockings (TED hose) for three weeks on both leg(s).  You may remove them at night for sleeping.   Weight bearing as tolerated   Complete by: As  directed      Allergies as of 12/15/2018   No Known Allergies     Medication List    STOP taking these medications   aspirin 81 MG tablet   Fish Oil 1200 MG Caps   Vitamin D3 50 MCG (2000 UT) Tabs     TAKE these medications   acetaminophen 650 MG CR tablet Commonly known as: TYLENOL Take 1,300 mg by mouth every 8 (eight) hours as needed for pain.   amLODipine 10 MG tablet Commonly known as: NORVASC TAKE 1 TABLET BY MOUTH EVERY DAY   atorvastatin 40 MG tablet Commonly known as: LIPITOR TAKE 1 TABLET BY MOUTH EVERY DAY   Contour Next Test test strip Generic drug: glucose blood USE AS DIRECTED THREE TIMES A DAY   furosemide 20 MG tablet Commonly known as: LASIX Take 20 mg by mouth daily.   HYDROcodone-acetaminophen 5-325 MG tablet Commonly known as: NORCO/VICODIN Take 1-2 tablets by mouth every 6 (six) hours as needed for severe pain.   Insulin Degludec-Liraglutide 100-3.6 UNIT-MG/ML Sopn Commonly known as: Xultophy Inject 50 Units into the skin daily with breakfast.   isosorbide mononitrate 30 MG 24 hr tablet Commonly known as: IMDUR Take 1 tablet (30 mg total) by mouth daily.   latanoprost 0.005 % ophthalmic solution Commonly known as: XALATAN Place 1 drop into the left eye at bedtime.   lisinopril 20 MG tablet Commonly known as: ZESTRIL Take 1 tablet (20 mg total) by mouth daily.   methocarbamol 500 MG  tablet Commonly known as: ROBAXIN Take 1 tablet (500 mg total) by mouth every 6 (six) hours as needed for muscle spasms.   metoprolol succinate 25 MG 24 hr tablet Commonly known as: TOPROL-XL TAKE 1.5 TABLETS BY MOUTH 2 TIMES DAILY.   Pen Needles 31G X 8 MM Misc Use qhs   Insulin Pen Needle 32G X 4 MM Misc USE AS DIRECTED EVERY MORNING   rivaroxaban 10 MG Tabs tablet Commonly known as: XARELTO Take 1 tablet (10 mg total) by mouth daily with breakfast for 20 days. Then resume one 81 mg aspirin once a day.   sodium bicarbonate 650 MG tablet Take  650 mg by mouth 3 (three) times daily.   traMADol 50 MG tablet Commonly known as: ULTRAM Take 1-2 tablets (50-100 mg total) by mouth every 6 (six) hours as needed for moderate pain.            Discharge Care Instructions  (From admission, onward)         Start     Ordered   12/15/18 0000  Weight bearing as tolerated     12/15/18 0800   12/15/18 0000  Change dressing    Comments: You may change your dressing on Friday, then change the dressing daily with sterile 4 x 4 inch gauze dressing and paper tape.   12/15/18 0800         Follow-up Information    Gaynelle Arabian, MD. Go on 12/27/2018.   Specialty: Orthopedic Surgery Why: You are scheduled for a post op appointment on Tuesday, December 27, 2018 at 01:30 PM. Contact information: [address] [place] [address] [phone]           Signed: Theresa Duty, PA-C Orthopedic Surgery 12/19/2018, 7:54 AM

## 2019-01-04 ENCOUNTER — Other Ambulatory Visit: Payer: Self-pay | Admitting: "Endocrinology

## 2019-01-09 ENCOUNTER — Other Ambulatory Visit: Payer: Self-pay | Admitting: Cardiology

## 2019-01-09 MED ORDER — ATORVASTATIN CALCIUM 40 MG PO TABS: 40.0000 mg | ORAL_TABLET | Freq: Every day | ORAL | 1 refills | Status: DC

## 2019-01-09 NOTE — Telephone Encounter (Signed)
° ° °  1. Which medications need to be refilled? (please list name of each medication and dose if known)    atorvastatin (LIPITOR) 40 MG tablet    2. Which pharmacy/location (including street and city if local pharmacy) is medication to be sent to?   [facility]   3. Do they need a 30 day or 90 day supply?

## 2019-01-09 NOTE — Telephone Encounter (Signed)
Medication sent to pharmacy  

## 2019-01-17 DIAGNOSIS — Z471 Aftercare following joint replacement surgery: Secondary | ICD-10-CM | POA: Diagnosis not present

## 2019-01-17 DIAGNOSIS — E1122 Type 2 diabetes mellitus with diabetic chronic kidney disease: Secondary | ICD-10-CM | POA: Diagnosis not present

## 2019-01-17 DIAGNOSIS — Z96641 Presence of right artificial hip joint: Secondary | ICD-10-CM | POA: Diagnosis not present

## 2019-01-17 DIAGNOSIS — N183 Chronic kidney disease, stage 3 (moderate): Secondary | ICD-10-CM | POA: Diagnosis not present

## 2019-01-24 ENCOUNTER — Encounter: Payer: Self-pay | Admitting: "Endocrinology

## 2019-01-24 ENCOUNTER — Other Ambulatory Visit: Payer: Self-pay

## 2019-01-24 ENCOUNTER — Ambulatory Visit (INDEPENDENT_AMBULATORY_CARE_PROVIDER_SITE_OTHER): Payer: Medicare Other | Admitting: "Endocrinology

## 2019-01-24 DIAGNOSIS — I10 Essential (primary) hypertension: Secondary | ICD-10-CM | POA: Diagnosis not present

## 2019-01-24 DIAGNOSIS — E782 Mixed hyperlipidemia: Secondary | ICD-10-CM | POA: Diagnosis not present

## 2019-01-24 DIAGNOSIS — N183 Chronic kidney disease, stage 3 unspecified: Secondary | ICD-10-CM

## 2019-01-24 DIAGNOSIS — E1122 Type 2 diabetes mellitus with diabetic chronic kidney disease: Secondary | ICD-10-CM

## 2019-01-24 DIAGNOSIS — I25118 Atherosclerotic heart disease of native coronary artery with other forms of angina pectoris: Secondary | ICD-10-CM

## 2019-01-24 NOTE — Progress Notes (Signed)
Endocrinology follow-up note   Subjective:    Patient ID: John Bishop, male    DOB: September 26, 1940. Patient is engaged in telephone visit for the management of type 2 diabetes complicated by stage 3-4 renal insufficiency, and hyperlipidemia.     Past Medical History:  Diagnosis Date  . Arthritis   . Diabetes mellitus, type II ([place])   . Fatty liver   . Hyperlipidemia   . Hypertension   . Kidney disease   . Progressive muscular atrophy ([place]) 04/27/2018   Past Surgical History:  Procedure Laterality Date  . COLONOSCOPY W/ POLYPECTOMY    . EYE SURGERY Bilateral    catarcts with lens  . MUSCLE BIOPSY Left 12/31/2017   Procedure: Left Deltoid Muscle Biopsy;  Surgeon: Ashok Pall, MD;  Location: [facility];  Service: Neurosurgery;  Laterality: Left;  Left Deltoid Muscle Biopsy  . OTHER SURGICAL HISTORY     growths removed R buttock1970, RU arm 2001, and R wrist 2009.  Marland Kitchen TOTAL HIP ARTHROPLASTY Right 12/14/2018   Procedure: TOTAL HIP ARTHROPLASTY ANTERIOR APPROACH;  Surgeon: Gaynelle Arabian, MD;  Location[facility];  Service: Orthopedics;  Laterality: Right;  157min   Social History   Socioeconomic History  . Marital status: Married    Spouse name: Not on file  . Number of children: Not on file  . Years of education: Not on file  . Highest education level: Not on file  Occupational History  . Not on file  Social Needs  . Financial resource strain: Not on file  . Food insecurity    Worry: Not on file    Inability: Not on file  . Transportation needs    Medical: Not on file    Non-medical: Not on file  Tobacco Use  . Smoking status: Former Smoker    Quit date: 06/07/1986    Years since quitting: 32.6  . Smokeless tobacco: Never Used  Substance and Sexual Activity  . Alcohol use: No    Alcohol/week: 0.0 standard drinks    Comment: quit 1974  . Drug use: No  . Sexual activity: Not on file  Lifestyle  . Physical activity    Days per week: Not on file    Minutes per session:  Not on file  . Stress: Not on file  Relationships  . Social Herbalist on phone: Not on file    Gets together: Not on file    Attends religious service: Not on file    Active member of club or organization: Not on file    Attends meetings of clubs or organizations: Not on file    Relationship status: Not on file  Other Topics Concern  . Not on file  Social History Narrative   Lives home with wife, [name].  Education college.  One child in [place], [place].  Retired- Agricultural engineer.   Outpatient Encounter Medications as of 01/24/2019  Medication Sig  . acetaminophen (TYLENOL) 650 MG CR tablet Take 1,300 mg by mouth every 8 (eight) hours as needed for pain.   Marland Kitchen amLODipine (NORVASC) 10 MG tablet TAKE 1 TABLET BY MOUTH EVERY DAY (Patient taking differently: Take 10 mg by mouth daily. )  . atorvastatin (LIPITOR) 40 MG tablet Take 1 tablet (40 mg total) by mouth daily.  . CONTOUR NEXT TEST test strip USE AS DIRECTED THREE TIMES A DAY  . furosemide (LASIX) 20 MG tablet Take 20 mg by mouth daily.  Marland Kitchen HYDROcodone-acetaminophen (NORCO/VICODIN) 5-325 MG tablet Take  1-2 tablets by mouth every 6 (six) hours as needed for severe pain.  . Insulin Pen Needle (PEN NEEDLES) 31G X 8 MM MISC Use qhs  . Insulin Pen Needle 32G X 4 MM MISC USE AS DIRECTED EVERY MORNING  . isosorbide mononitrate (IMDUR) 30 MG 24 hr tablet Take 1 tablet (30 mg total) by mouth daily.  Marland Kitchen latanoprost (XALATAN) 0.005 % ophthalmic solution Place 1 drop into the left eye at bedtime.  Marland Kitchen lisinopril (PRINIVIL,ZESTRIL) 20 MG tablet Take 1 tablet (20 mg total) by mouth daily.  . methocarbamol (ROBAXIN) 500 MG tablet Take 1 tablet (500 mg total) by mouth every 6 (six) hours as needed for muscle spasms.  . metoprolol succinate (TOPROL-XL) 25 MG 24 hr tablet TAKE 1.5 TABLETS BY MOUTH 2 TIMES DAILY.  . rivaroxaban (XARELTO) 10 MG TABS tablet Take 1 tablet (10 mg total) by mouth daily with breakfast for 20 days. Then resume one 81 mg  aspirin once a day.  . sodium bicarbonate 650 MG tablet Take 650 mg by mouth 3 (three) times daily.  . traMADol (ULTRAM) 50 MG tablet Take 1-2 tablets (50-100 mg total) by mouth every 6 (six) hours as needed for moderate pain.  [name] 100-3.6 UNIT-MG/ML SOPN INJECT 50 UNITS INTO THE SKIN DAILY WITH BREAKFAST.   No facility-administered encounter medications on file as of 01/24/2019.    ALLERGIES: No Known Allergies VACCINATION STATUS:  There is no immunization history on file for this patient.  Diabetes He presents for his follow-up diabetic visit. He has type 2 diabetes mellitus. Onset time: He was diagnosed at approximate age of 63 years. His disease course has been worsening. There are no hypoglycemic associated symptoms. Pertinent negatives for hypoglycemia include no confusion, pallor or seizures. Pertinent negatives for diabetes include no fatigue, no polydipsia, no polyphagia, no polyuria and no weakness. There are no hypoglycemic complications. Symptoms are worsening. Diabetic complications include nephropathy. Pertinent negatives for diabetic complications include no heart disease. Risk factors for coronary artery disease include diabetes mellitus, dyslipidemia, male sex and tobacco exposure. Current diabetic treatment includes insulin injections and oral agent (dual therapy). He is compliant with treatment most of the time. He is following a generally healthy diet. He has not had a previous visit with a dietitian. He participates in exercise intermittently. There is no change in his home blood glucose trend. His breakfast blood glucose range is generally 140-180 mg/dl. His overall blood glucose range is 140-180 mg/dl. An ACE inhibitor/angiotensin II receptor blocker is being taken. Eye exam is current (No retinopathy February 2019).  Hyperlipidemia This is a chronic problem. The current episode started more than 1 year ago. The problem is controlled. Exacerbating diseases include  diabetes. Pertinent negatives include no myalgias. Current antihyperlipidemic treatment includes statins. Risk factors for coronary artery disease include dyslipidemia, diabetes mellitus, hypertension and male sex.    Objective:    There were no vitals taken for this visit.  Wt Readings from Last 3 Encounters:  12/14/18 203 lb 9.6 oz (92.4 kg)  12/07/18 203 lb 9.6 oz (92.4 kg)  11/15/18 206 lb (93.4 kg)    CMP     Component Value Date/Time   NA 131 (L) 12/15/2018 0250   K 5.0 12/15/2018 0250   CL 103 12/15/2018 0250   CO2 20 (L) 12/15/2018 0250   GLUCOSE 313 (H) 12/15/2018 0250   BUN 23 12/15/2018 0250   BUN 32 (A) 09/05/2018   CREATININE 1.45 (H) 12/15/2018 0250   CALCIUM 8.5 (  L) 12/15/2018 0250   PROT 7.4 12/07/2018 1457   ALBUMIN 4.0 12/07/2018 1457   AST 24 12/07/2018 1457   ALT 27 12/07/2018 1457   ALKPHOS 86 12/07/2018 1457   BILITOT 0.8 12/07/2018 1457   GFRNONAA 46 (L) 12/15/2018 0250   GFRAA 53 (L) 12/15/2018 0250   Lipid Panel     Component Value Date/Time   CHOL 112 03/07/2018   TRIG 74 03/07/2018   HDL 38 03/07/2018   LDLCALC 59 03/07/2018   Diabetic Labs (most recent): Lab Results  Component Value Date   HGBA1C 7.5 (H) 12/07/2018   HGBA1C 7 09/05/2018   HGBA1C 7.0 (A) 03/07/2018    Completed labs from 07/24/2015 to be scanned into his records.  Assessment & Plan:   1. Diabetes mellitus with stage 3 chronic kidney disease ([place])  - Patient has currently controlled asymptomatic type 2 DM since  78 years of age. - He reported recently controlled glycemic profile to target.  He is A1c was 7.5% on December 07, 2018 increasing from 7% during his last visit in March.     -His diabetes is complicated by stage 3 CKD ( which is stable GFR between 44-52).   - He remains at a high risk for more acute and chronic complications of diabetes which include CAD, CVA, CKD, retinopathy, and neuropathy. These are all discussed in detail with the patient.  - I have  counseled the patient on diet management and weight loss, by adopting a carbohydrate restricted/protein rich diet. - he  admits there is a room for improvement in his diet and drink choices. -  Suggestion is made for him to avoid simple carbohydrates  from his diet including Cakes, Sweet Desserts / Pastries, Ice Cream, Soda (diet and regular), Sweet Tea, Candies, Chips, Cookies, Sweet Pastries,  Store Bought Juices, Alcohol in Excess of  1-2 drinks a day, Artificial Sweeteners, Coffee Creamer, and "Sugar-free" Products. This will help patient to have stable blood glucose profile and potentially avoid unintended weight gain. - I encouraged the patient to switch to  unprocessed or minimally processed complex starch and increased protein intake (animal or plant source), fruits, and vegetables.  - Patient is advised to stick to a routine mealtimes to eat 3 meals  a day and avoid unnecessary snacks ( to snack only to correct hypoglycemia).   - I have approached patient with the following individualized plan to manage diabetes and patient agrees:   -  He has benefited from simplified treatment with Xultophy. -He is advised to continue  Xultophy  50/1.8 subcutaneously daily with breakfast, associated with strict monitoring of blood glucose daily before breakfast, and any time as needed.    - This product will help for both diabetes and weight control.  -He is encouraged  to call clinic for blood glucose levels less than 70 or above 200 mg /dl. -Patient is not a candidate for metformin, SGLT2 inhibitors due to CKD- renal function is improving GFR at 53.   2) Lipids/HPL: His recent lipid panel showed controlled LDL at 59.  He is advised to continue atorvastatin 40 mg p.o. nightly.    3) hypertension:he is advised to home monitor blood pressure and report if > 140/90 on 2 separate readings.  -He is on lisinopril 20 mg p.o. daily, isosorbide 30 mg p.o. daily, metoprolol 25 mg p.o. daily.  - I advised  patient to maintain close follow up with PMD  for primary care needs.   - Patient Care Time  Today:  25 min, of which >50% was spent in  counseling and the rest reviewing his  current and  previous labs/studies, previous treatments, his blood glucose readings, and medications' doses and developing a plan for long-term care based on the latest recommendations for standards of care.   [name] participated in the discussions, expressed understanding, and voiced agreement with the above plans.  All questions were answered to his satisfaction. he is encouraged to contact clinic should he have any questions or concerns prior to his return visit.    Follow up plan: - Return in about 4 months (around 05/26/2019) for Bring Meter and Logs- A1c in Office.  Glade Lloyd, MD Phone: [phone]  Fax: [phone]   -  This note was partially dictated with voice recognition software. Similar sounding words can be transcribed inadequately or may not  be corrected upon review.  01/24/2019, 5:55 PM

## 2019-01-25 DIAGNOSIS — Z7409 Other reduced mobility: Secondary | ICD-10-CM | POA: Diagnosis not present

## 2019-01-25 DIAGNOSIS — G1220 Motor neuron disease, unspecified: Secondary | ICD-10-CM | POA: Diagnosis not present

## 2019-01-25 DIAGNOSIS — G1221 Amyotrophic lateral sclerosis: Secondary | ICD-10-CM | POA: Diagnosis not present

## 2019-01-25 DIAGNOSIS — R2689 Other abnormalities of gait and mobility: Secondary | ICD-10-CM | POA: Diagnosis not present

## 2019-02-16 DIAGNOSIS — I10 Essential (primary) hypertension: Secondary | ICD-10-CM | POA: Diagnosis not present

## 2019-02-16 DIAGNOSIS — N183 Chronic kidney disease, stage 3 (moderate): Secondary | ICD-10-CM | POA: Diagnosis not present

## 2019-02-16 DIAGNOSIS — E782 Mixed hyperlipidemia: Secondary | ICD-10-CM | POA: Diagnosis not present

## 2019-02-16 DIAGNOSIS — E1165 Type 2 diabetes mellitus with hyperglycemia: Secondary | ICD-10-CM | POA: Diagnosis not present

## 2019-02-20 DIAGNOSIS — E1122 Type 2 diabetes mellitus with diabetic chronic kidney disease: Secondary | ICD-10-CM | POA: Diagnosis not present

## 2019-02-20 DIAGNOSIS — Z23 Encounter for immunization: Secondary | ICD-10-CM | POA: Diagnosis not present

## 2019-02-20 DIAGNOSIS — G1221 Amyotrophic lateral sclerosis: Secondary | ICD-10-CM | POA: Diagnosis not present

## 2019-02-20 DIAGNOSIS — Z6828 Body mass index (BMI) 28.0-28.9, adult: Secondary | ICD-10-CM | POA: Diagnosis not present

## 2019-02-20 DIAGNOSIS — I25721 Atherosclerosis of autologous artery coronary artery bypass graft(s) with angina pectoris with documented spasm: Secondary | ICD-10-CM | POA: Diagnosis not present

## 2019-02-20 DIAGNOSIS — M1611 Unilateral primary osteoarthritis, right hip: Secondary | ICD-10-CM | POA: Diagnosis not present

## 2019-02-20 DIAGNOSIS — E782 Mixed hyperlipidemia: Secondary | ICD-10-CM | POA: Diagnosis not present

## 2019-02-20 DIAGNOSIS — I739 Peripheral vascular disease, unspecified: Secondary | ICD-10-CM | POA: Diagnosis not present

## 2019-02-28 ENCOUNTER — Other Ambulatory Visit: Payer: Self-pay | Admitting: "Endocrinology

## 2019-03-09 DIAGNOSIS — G1221 Amyotrophic lateral sclerosis: Secondary | ICD-10-CM | POA: Diagnosis not present

## 2019-03-09 DIAGNOSIS — D649 Anemia, unspecified: Secondary | ICD-10-CM | POA: Diagnosis not present

## 2019-03-09 DIAGNOSIS — I129 Hypertensive chronic kidney disease with stage 1 through stage 4 chronic kidney disease, or unspecified chronic kidney disease: Secondary | ICD-10-CM | POA: Diagnosis not present

## 2019-03-09 DIAGNOSIS — N183 Chronic kidney disease, stage 3 unspecified: Secondary | ICD-10-CM | POA: Diagnosis not present

## 2019-03-21 ENCOUNTER — Other Ambulatory Visit: Payer: Self-pay | Admitting: Cardiology

## 2019-03-21 NOTE — Telephone Encounter (Signed)
Refill sent.

## 2019-03-21 NOTE — Telephone Encounter (Signed)
°*  STAT* If patient is at the pharmacy, call can be transferred to refill team.   1. Which medications need to be refilled? amLODipine (NORVASC) 10 MG tablet   2. Which pharmacy/location (including street and city if local pharmacy) [facility] [place] [place]  3. Do they need a 30 day or 90 day supply?

## 2019-04-05 ENCOUNTER — Other Ambulatory Visit: Payer: Self-pay | Admitting: "Endocrinology

## 2019-04-18 DIAGNOSIS — H33322 Round hole, left eye: Secondary | ICD-10-CM | POA: Diagnosis not present

## 2019-04-18 DIAGNOSIS — H401121 Primary open-angle glaucoma, left eye, mild stage: Secondary | ICD-10-CM | POA: Diagnosis not present

## 2019-04-19 DIAGNOSIS — H33002 Unspecified retinal detachment with retinal break, left eye: Secondary | ICD-10-CM | POA: Diagnosis not present

## 2019-04-20 DIAGNOSIS — I2510 Atherosclerotic heart disease of native coronary artery without angina pectoris: Secondary | ICD-10-CM | POA: Diagnosis not present

## 2019-04-20 DIAGNOSIS — Z01810 Encounter for preprocedural cardiovascular examination: Secondary | ICD-10-CM | POA: Diagnosis not present

## 2019-04-20 DIAGNOSIS — H3322 Serous retinal detachment, left eye: Secondary | ICD-10-CM | POA: Diagnosis not present

## 2019-04-20 DIAGNOSIS — I129 Hypertensive chronic kidney disease with stage 1 through stage 4 chronic kidney disease, or unspecified chronic kidney disease: Secondary | ICD-10-CM | POA: Diagnosis not present

## 2019-04-20 DIAGNOSIS — E119 Type 2 diabetes mellitus without complications: Secondary | ICD-10-CM | POA: Diagnosis not present

## 2019-04-20 DIAGNOSIS — E1122 Type 2 diabetes mellitus with diabetic chronic kidney disease: Secondary | ICD-10-CM | POA: Diagnosis not present

## 2019-04-20 DIAGNOSIS — H33002 Unspecified retinal detachment with retinal break, left eye: Secondary | ICD-10-CM | POA: Diagnosis not present

## 2019-04-20 DIAGNOSIS — H35372 Puckering of macula, left eye: Secondary | ICD-10-CM | POA: Diagnosis not present

## 2019-04-20 DIAGNOSIS — M1990 Unspecified osteoarthritis, unspecified site: Secondary | ICD-10-CM | POA: Diagnosis not present

## 2019-04-20 DIAGNOSIS — N183 Chronic kidney disease, stage 3 unspecified: Secondary | ICD-10-CM | POA: Diagnosis not present

## 2019-04-20 DIAGNOSIS — H33022 Retinal detachment with multiple breaks, left eye: Secondary | ICD-10-CM | POA: Diagnosis not present

## 2019-04-20 DIAGNOSIS — Z7982 Long term (current) use of aspirin: Secondary | ICD-10-CM | POA: Diagnosis not present

## 2019-05-05 ENCOUNTER — Other Ambulatory Visit: Payer: Self-pay | Admitting: Cardiology

## 2019-05-15 DIAGNOSIS — H33022 Retinal detachment with multiple breaks, left eye: Secondary | ICD-10-CM | POA: Diagnosis not present

## 2019-05-23 ENCOUNTER — Encounter: Payer: Self-pay | Admitting: Family Medicine

## 2019-05-23 ENCOUNTER — Telehealth (INDEPENDENT_AMBULATORY_CARE_PROVIDER_SITE_OTHER): Payer: Medicare Other | Admitting: Family Medicine

## 2019-05-23 ENCOUNTER — Encounter: Payer: Self-pay | Admitting: *Deleted

## 2019-05-23 VITALS — BP 139/76 | HR 76 | Ht 70.0 in | Wt 204.0 lb

## 2019-05-23 DIAGNOSIS — R079 Chest pain, unspecified: Secondary | ICD-10-CM

## 2019-05-23 DIAGNOSIS — I10 Essential (primary) hypertension: Secondary | ICD-10-CM | POA: Diagnosis not present

## 2019-05-23 DIAGNOSIS — E782 Mixed hyperlipidemia: Secondary | ICD-10-CM

## 2019-05-23 DIAGNOSIS — G1221 Amyotrophic lateral sclerosis: Secondary | ICD-10-CM | POA: Diagnosis not present

## 2019-05-23 NOTE — Progress Notes (Signed)
Virtual Visit via Telephone Note   This visit type was conducted due to national recommendations for restrictions regarding the COVID-19 Pandemic (e.g. social distancing) in an effort to limit this patient's exposure and mitigate transmission in our community.  Due to his co-morbid illnesses, this patient is at least at moderate risk for complications without adequate follow up.  This format is felt to be most appropriate for this patient at this time.  The patient did not have access to video technology/had technical difficulties with video requiring transitioning to audio format only (telephone).  All issues noted in this document were discussed and addressed.  No physical exam could be performed with this format.  Please refer to the patient's chart for his  consent to telehealth for [facility].   Date:  05/23/2019   ID:  John Bishop, DOB 1940-11-22, MRN [id number]  Patient Location: Home Provider Location: Office  PCP:  John Labrum, MD  Cardiologist:  John Dolly, MD  Electrophysiologist:  None   Evaluation Performed:  Follow-Up Visit  Chief Complaint: 40-month follow-up for chest pain, hyperlipidemia, CKD stage III,, diabetes type 2 and peripheral arterial disease, hypertension.  Status post right total hip replacement.  History of Present Illness:    John Bishop is a 78 y.o. male last encounter with John Bishop on December 15, 2018 for an office visit.  History of chest pain, CKD stage III, hypertension, DM type II.   He was being considered for hip replacement at last visit.  He is now status post right total hip arthroplasty with anterior approach in July 2020 at [facility].  Blood pressure  within normal limits today.  Patient states his blood pressures are usually good at home.  He had a recent lab work at PCP and states cholesterol levels were slightly abnormal and his hemoglobin A1c was 6.4%.  States he recently had retinal detachment surgery and  is doing well.  Cardiac medications reviewed with patient.  There have been no changes.  Patient has a new diagnosis of ALS.  He sees a Garment/textile technologist in [place] at [facility] [place].   The patient does not have symptoms concerning for COVID-19 infection (fever, chills, cough, or new shortness of breath).    Past Medical History:  Diagnosis Date  . Arthritis   . Diabetes mellitus, type II ([place])   . Fatty liver   . Hyperlipidemia   . Hypertension   . Kidney disease   . Progressive muscular atrophy ([place]) 04/27/2018   Past Surgical History:  Procedure Laterality Date  . COLONOSCOPY W/ POLYPECTOMY    . EYE SURGERY Bilateral    catarcts with lens  . MUSCLE BIOPSY Left 12/31/2017   Procedure: Left Deltoid Muscle Biopsy;  Surgeon: John Pall, MD;  Location: [facility];  Service: Neurosurgery;  Laterality: Left;  Left Deltoid Muscle Biopsy  . OTHER SURGICAL HISTORY     growths removed R buttock1970, RU arm 2001, and R wrist 2009.  Marland Kitchen TOTAL HIP ARTHROPLASTY Right 12/14/2018   Procedure: TOTAL HIP ARTHROPLASTY ANTERIOR APPROACH;  Surgeon: John Arabian, MD;  [facility];  Service: Orthopedics;  Laterality: Right;  167min     Current Meds  Medication Sig  . amLODipine (NORVASC) 10 MG tablet TAKE 1 TABLET BY MOUTH EVERY DAY  . aspirin EC 81 MG tablet Take 81 mg by mouth daily.  Marland Kitchen atorvastatin (LIPITOR) 40 MG tablet Take 1 tablet (40 mg total) by mouth daily.  . CONTOUR NEXT TEST  test strip USE AS DIRECTED THREE TIMES A DAY  . furosemide (LASIX) 20 MG tablet Take 20 mg by mouth daily.  . Insulin Pen Needle 32G X 4 MM MISC USE AS DIRECTED EVERY MORNING  . isosorbide mononitrate (IMDUR) 30 MG 24 hr tablet TAKE 1 TABLET BY MOUTH EVERY DAY  . latanoprost (XALATAN) 0.005 % ophthalmic solution Place 1 drop into the left eye at bedtime.  Marland Kitchen lisinopril (ZESTRIL) 20 MG tablet TAKE 1 TABLET BY MOUTH EVERY DAY  . metoprolol succinate (TOPROL-XL) 25 MG 24 hr tablet TAKE 1.5 TABLETS BY  MOUTH 2 TIMES DAILY.  . sodium bicarbonate 650 MG tablet Take 650 mg by mouth 3 (three) times daily.  John Bishop 100-3.6 UNIT-MG/ML SOPN INJECT 50 UNITS INTO THE SKIN DAILY WITH BREAKFAST.     Allergies:   Patient has no known allergies.   Social History   Tobacco Use  . Smoking status: Former Smoker    Quit date: 06/07/1986    Years since quitting: 32.9  . Smokeless tobacco: Never Used  Substance Use Topics  . Alcohol use: No    Alcohol/week: 0.0 standard drinks    Comment: quit 1974  . Drug use: No     Family Hx: The patient's family history includes Diabetes in his father, mother, and another family member; Heart disease in his father and another family member; High Cholesterol in his father and mother; Hyperlipidemia in an other family member; Hypertension in his father, mother, and another family member; Lung cancer in his mother.  ROS:   Please see the history of present illness.    All other systems reviewed and are negative.   Prior CV studies:   The following studies were reviewed today: Stress test performed in July 2017  Echocardiogram performed in September 2017 Study Conclusions  - Left ventricle: The cavity size was normal. Wall thickness was   normal. Systolic function was normal. The estimated ejection   fraction was in the range of 55% to 60%. Wall motion was normal;   there were no regional wall motion abnormalities. Doppler   parameters are consistent with abnormal left ventricular   relaxation (grade 1 diastolic dysfunction). - Aortic valve: Mildly calcified annulus. Trileaflet; normal   thickness leaflets. Valve area (VTI): 2.41 cm^2. Valve area   (Vmax): 2.35 cm^2. Valve area (Vmean): 2.05 cm^2. - Mitral valve: Mildly calcified annulus. Normal thickness leaflets   . - Technically adequate study.  Screening vascular aortic ultrasound December 2017 Normal caliber abdominal aorta, common and external iliac arteries, without focal  dilatation. Aorto-iliac atherosclerosis. > 50% stenosis left external iliac artery. The IVC is patent.    Labs/Other Tests and Data Reviewed:    EKG:  An ECG dated February 22, 2018 was personally reviewed today and demonstrated:  Normal sinus rhythm 68  Recent Labs: 12/07/2018: ALT 27 12/15/2018: BUN 23; Creatinine, Ser 1.45; Hemoglobin 11.6; Platelets 148; Potassium 5.0; Sodium 131   Recent Lipid Panel Lab Results  Component Value Date/Time   CHOL 112 03/07/2018 12:00 AM   TRIG 74 03/07/2018 12:00 AM   HDL 38 03/07/2018 12:00 AM   LDLCALC 59 03/07/2018 12:00 AM    Wt Readings from Last 3 Encounters:  05/23/19 204 lb (92.5 kg)  12/14/18 203 lb 9.6 oz (92.4 kg)  12/07/18 203 lb 9.6 oz (92.4 kg)     Objective:    Vital Signs:  BP 139/76   Pulse 76   Ht 5\' 10"  (1.778 m)   Wt 204  lb (92.5 kg)   BMI 29.27 kg/m    VITAL SIGNS:  reviewed   Normal speech pattern and responding appropriately to questions.  No evidence of cough, wheezing, or dyspnea.  ASSESSMENT & PLAN:    1. Essential hypertension Blood pressure within normal limits today.  States blood pressures have been doing well at home.  Continue Toprol-XL 25 mg daily, Lasix 20 mg daily, lisinopril 20 mg daily  2. Mixed hyperlipidemia Atorvastatin 40 mg daily.  Patient states he recently had lab work at PCP office we will attempt to get those lab results.  Continue atorvastatin.  3. Chest pain of uncertain etiology Patient denies any more recent episodes of chest pain or progressive anginal/dyspnea-like symptoms.  Continue aspirin 81 mg, Imdur 30 mg daily.  4. ALS  Sees [facility] in [place] Patient recently had an abnormal nerve conduction study at [facility].  Needle electromyogram demonstrates mixed picture of myopathic motor unit action potentials as well as large motor unit action potential with neurogenic recruitment pattern.  This can be seen with a variety of  conditions including inclusion body myositis, inflammatory myopathy (such as polymyositis), facial CO scapulohumeral dystrophy, or amyloidosis.  No evidence of underlying widespread large fiber neuropathy.  COVID-19 Education: The signs and symptoms of COVID-19 were discussed with the patient and how to seek care for testing (follow up with PCP or arrange E-visit).  The importance of social distancing was discussed today.  Time:   Today, I have spent 15 minutes with the patient with telehealth technology discussing the above problems.     Medication Adjustments/Labs and Tests Ordered: Current medicines are reviewed at length with the patient today.  Concerns regarding medicines are outlined above.   Tests Ordered: No orders of the defined types were placed in this encounter.   Medication Changes: No orders of the defined types were placed in this encounter.   Follow Up:  Either In Person or Virtual in 6 month(s)  Signed, Verta Ellen, NP  05/23/2019 1:00 PM    [place]

## 2019-05-23 NOTE — Patient Instructions (Signed)

## 2019-05-29 ENCOUNTER — Encounter: Payer: Self-pay | Admitting: "Endocrinology

## 2019-05-29 ENCOUNTER — Other Ambulatory Visit: Payer: Self-pay

## 2019-05-29 ENCOUNTER — Ambulatory Visit (INDEPENDENT_AMBULATORY_CARE_PROVIDER_SITE_OTHER): Payer: Medicare Other | Admitting: "Endocrinology

## 2019-05-29 VITALS — BP 139/76 | HR 71 | Ht 70.0 in | Wt 208.8 lb

## 2019-05-29 DIAGNOSIS — E782 Mixed hyperlipidemia: Secondary | ICD-10-CM

## 2019-05-29 DIAGNOSIS — I25118 Atherosclerotic heart disease of native coronary artery with other forms of angina pectoris: Secondary | ICD-10-CM

## 2019-05-29 DIAGNOSIS — N183 Chronic kidney disease, stage 3 unspecified: Secondary | ICD-10-CM

## 2019-05-29 DIAGNOSIS — I10 Essential (primary) hypertension: Secondary | ICD-10-CM | POA: Diagnosis not present

## 2019-05-29 DIAGNOSIS — E1122 Type 2 diabetes mellitus with diabetic chronic kidney disease: Secondary | ICD-10-CM

## 2019-05-29 LAB — POCT GLYCOSYLATED HEMOGLOBIN (HGB A1C): Hemoglobin A1C: 7.6 % — AB (ref 4.0–5.6)

## 2019-05-29 MED ORDER — GLIPIZIDE ER 2.5 MG PO TB24: 2.5000 mg | ORAL_TABLET | Freq: Every day | ORAL | 3 refills | Status: DC

## 2019-05-29 NOTE — Progress Notes (Signed)
05/29/2019  Endocrinology follow-up note   Subjective:    Patient ID: John Bishop, male    DOB: 1940-11-17. Patient is here for follow up in the management of type 2 diabetes complicated by stage 3-4 renal insufficiency, and hyperlipidemia.     Past Medical History:  Diagnosis Date  . Arthritis   . Diabetes mellitus, type II ([place])   . Fatty liver   . Hyperlipidemia   . Hypertension   . Kidney disease   . Progressive muscular atrophy ([place]) 04/27/2018   Past Surgical History:  Procedure Laterality Date  . COLONOSCOPY W/ POLYPECTOMY    . EYE SURGERY Bilateral    catarcts with lens  . MUSCLE BIOPSY Left 12/31/2017   Procedure: Left Deltoid Muscle Biopsy;  Surgeon: Ashok Pall, MD;  Location: [facility];  Service: Neurosurgery;  Laterality: Left;  Left Deltoid Muscle Biopsy  . OTHER SURGICAL HISTORY     growths removed R buttock1970, RU arm 2001, and R wrist 2009.  Marland Kitchen TOTAL HIP ARTHROPLASTY Right 12/14/2018   Procedure: TOTAL HIP ARTHROPLASTY ANTERIOR APPROACH;  Surgeon: Gaynelle Arabian, MD;  Location: [facility];  Service: Orthopedics;  Laterality: Right;  131min   Social History   Socioeconomic History  . Marital status: Married    Spouse name: Not on file  . Number of children: Not on file  . Years of education: Not on file  . Highest education level: Not on file  Occupational History  . Not on file  Tobacco Use  . Smoking status: Former Smoker    Quit date: 06/07/1986    Years since quitting: 32.9  . Smokeless tobacco: Never Used  Substance and Sexual Activity  . Alcohol use: No    Alcohol/week: 0.0 standard drinks    Comment: quit 1974  . Drug use: No  . Sexual activity: Not on file  Other Topics Concern  . Not on file  Social History Narrative   Lives home with wife, [name].  Education college.  One child in [place], [place].  Retired- Agricultural engineer.   Social Determinants of Health   Financial Resource Strain:   . Difficulty of Paying Living Expenses: Not on  file  Food Insecurity:   . Worried About Charity fundraiser in the Last Year: Not on file  . Ran Out of Food in the Last Year: Not on file  Transportation Needs:   . Lack of Transportation (Medical): Not on file  . Lack of Transportation (Non-Medical): Not on file  Physical Activity:   . Days of Exercise per Week: Not on file  . Minutes of Exercise per Session: Not on file  Stress:   . Feeling of Stress : Not on file  Social Connections:   . Frequency of Communication with Friends and Family: Not on file  . Frequency of Social Gatherings with Friends and Family: Not on file  . Attends Religious Services: Not on file  . Active Member of Clubs or Organizations: Not on file  . Attends Archivist Meetings: Not on file  . Marital Status: Not on file   Outpatient Encounter Medications as of 05/29/2019  Medication Sig  . Cholecalciferol (VITAMIN D3 PO) Take 4,000 mg by mouth 2 (two) times daily.  . Omega-3 Fatty Acids (FISH OIL) 1200 MG CAPS Take by mouth 2 (two) times daily.  Marland Kitchen amLODipine (NORVASC) 10 MG tablet TAKE 1 TABLET BY MOUTH EVERY DAY  . aspirin EC 81 MG tablet Take 81 mg by mouth daily.  Marland Kitchen  atorvastatin (LIPITOR) 40 MG tablet Take 1 tablet (40 mg total) by mouth daily.  . CONTOUR NEXT TEST test strip USE AS DIRECTED THREE TIMES A DAY  . furosemide (LASIX) 20 MG tablet Take 20 mg by mouth daily.  Marland Kitchen glipiZIDE (GLUCOTROL XL) 2.5 MG 24 hr tablet Take 1 tablet (2.5 mg total) by mouth daily with breakfast.  . Insulin Pen Needle 32G X 4 MM MISC USE AS DIRECTED EVERY MORNING  . isosorbide mononitrate (IMDUR) 30 MG 24 hr tablet TAKE 1 TABLET BY MOUTH EVERY DAY  . latanoprost (XALATAN) 0.005 % ophthalmic solution Place 1 drop into the left eye at bedtime.  Marland Kitchen lisinopril (ZESTRIL) 20 MG tablet TAKE 1 TABLET BY MOUTH EVERY DAY  . metoprolol succinate (TOPROL-XL) 25 MG 24 hr tablet TAKE 1.5 TABLETS BY MOUTH 2 TIMES DAILY.  . sodium bicarbonate 650 MG tablet Take 650 mg by mouth 3  (three) times daily.  [name] 100-3.6 UNIT-MG/ML SOPN INJECT 50 UNITS INTO THE SKIN DAILY WITH BREAKFAST.   No facility-administered encounter medications on file as of 05/29/2019.   ALLERGIES: No Known Allergies VACCINATION STATUS:  There is no immunization history on file for this patient.  Diabetes He presents for his follow-up diabetic visit. He has type 2 diabetes mellitus. Onset time: He was diagnosed at approximate age of 38 years. His disease course has been stable. There are no hypoglycemic associated symptoms. Pertinent negatives for hypoglycemia include no confusion, pallor or seizures. Pertinent negatives for diabetes include no fatigue, no polydipsia, no polyphagia, no polyuria and no weakness. There are no hypoglycemic complications. Symptoms are stable. Diabetic complications include nephropathy. Pertinent negatives for diabetic complications include no heart disease. Risk factors for coronary artery disease include diabetes mellitus, dyslipidemia, male sex and tobacco exposure. Current diabetic treatment includes insulin injections and oral agent (dual therapy). He is compliant with treatment most of the time. He is following a generally healthy diet. He has not had a previous visit with a dietitian. He participates in exercise intermittently. His home blood glucose trend is fluctuating minimally. His breakfast blood glucose range is generally 140-180 mg/dl. His overall blood glucose range is 140-180 mg/dl. An ACE inhibitor/angiotensin II receptor blocker is being taken. Eye exam is current (No retinopathy February 2019).  Hyperlipidemia This is a chronic problem. The current episode started more than 1 year ago. The problem is controlled. Exacerbating diseases include diabetes. Pertinent negatives include no myalgias. Current antihyperlipidemic treatment includes statins. Risk factors for coronary artery disease include dyslipidemia, diabetes mellitus, hypertension and male sex.     Review of systems: Limited as above  Objective:    BP 139/76   Pulse 71   Ht 5\' 10"  (1.778 m)   Wt 208 lb 12.8 oz (94.7 kg)   BMI 29.96 kg/m   Wt Readings from Last 3 Encounters:  05/29/19 208 lb 12.8 oz (94.7 kg)  05/23/19 204 lb (92.5 kg)  12/14/18 203 lb 9.6 oz (92.4 kg)     Physical Exam- Limited  Constitutional:  Body mass index is 29.96 kg/m. , not in acute distress, normal state of mind Eyes:  EOMI, no exophthalmos Neck: Supple Respiratory: Adequate breathing efforts Musculoskeletal: no gross deformities, strength intact in all four extremities, no gross restriction of joint movements Skin:  no rashes, no hyperemia Neurological: no tremor with outstretched hands.  CMP     Component Value Date/Time   NA 131 (L) 12/15/2018 0250   K 5.0 12/15/2018 0250   CL 103 12/15/2018  0250   CO2 20 (L) 12/15/2018 0250   GLUCOSE 313 (H) 12/15/2018 0250   BUN 23 12/15/2018 0250   BUN 32 (A) 09/05/2018 0000   CREATININE 1.45 (H) 12/15/2018 0250   CALCIUM 8.5 (L) 12/15/2018 0250   PROT 7.4 12/07/2018 1457   ALBUMIN 4.0 12/07/2018 1457   AST 24 12/07/2018 1457   ALT 27 12/07/2018 1457   ALKPHOS 86 12/07/2018 1457   BILITOT 0.8 12/07/2018 1457   GFRNONAA 46 (L) 12/15/2018 0250   GFRAA 53 (L) 12/15/2018 0250   Lipid Panel     Component Value Date/Time   CHOL 112 03/07/2018 0000   TRIG 74 03/07/2018 0000   HDL 38 03/07/2018 0000   LDLCALC 59 03/07/2018 0000   Diabetic Labs (most recent): Lab Results  Component Value Date   HGBA1C 7.6 (A) 05/29/2019   HGBA1C 7.5 (H) 12/07/2018   HGBA1C 7 09/05/2018    Completed labs from 07/24/2015 to be scanned into his records.  Assessment & Plan:   1. Diabetes mellitus with stage 3 chronic kidney disease ([place])  - Patient has currently controlled asymptomatic type 2 DM since  78 years of age. - He presents with near target fasting glycemic profile, she is point-of-care A1c 7.6%, progressively increasing from 7%.     -His  diabetes is complicated by stage 3 CKD ( which is stable GFR between 44-52).   - He remains at a high risk for more acute and chronic complications of diabetes which include CAD, CVA, CKD, retinopathy, and neuropathy. These are all discussed in detail with the patient.  - I have counseled the patient on diet management and weight loss, by adopting a carbohydrate restricted/protein rich diet.  - he  admits there is a room for improvement in his diet and drink choices. -  Suggestion is made for him to avoid simple carbohydrates  from his diet including Cakes, Sweet Desserts / Pastries, Ice Cream, Soda (diet and regular), Sweet Tea, Candies, Chips, Cookies, Sweet Pastries,  Store Bought Juices, Alcohol in Excess of  1-2 drinks a day, Artificial Sweeteners, Coffee Creamer, and "Sugar-free" Products. This will help patient to have stable blood glucose profile and potentially avoid unintended weight gain.   - Patient is advised to stick to a routine mealtimes to eat 3 meals  a day and avoid unnecessary snacks ( to snack only to correct hypoglycemia).   - I have approached patient with the following individualized plan to manage diabetes and patient agrees:   -  He has benefited from simplified treatment regimen, he is advised to continue  Xultophy  50/1.8 subcutaneously daily with breakfast, associated with strict monitoring of blood glucose daily before breakfast, and any time as needed.   - This product will help for both diabetes and weight control.  -He is encouraged  to call clinic for blood glucose levels less than 70 or above 200 mg /dl. -He will benefit from low-dose glipizide.  I discussed and added glipizide 2.5 mg XL p.o. daily at breakfast. -Patient is not a candidate for metformin, SGLT2 inhibitors due to CKD- renal function is proving with a GFR of 47.     2) Lipids/HPL: His recent lipid panel showed controlled LDL at 59.  He is advised to continue atorvastatin 40 mg p.o. nightly.       3) hypertension: His blood pressure is controlled to target. -He is on lisinopril 20 mg p.o. daily, isosorbide 30 mg p.o. daily, metoprolol 25 mg p.o. daily.  -  I advised patient to maintain close follow up with PMD  for primary care needs.   - Time spent with the patient: 25 min, of which >50% was spent in reviewing his blood glucose logs , discussing his hypoglycemia and hyperglycemia episodes, reviewing his current and  previous labs / studies and medications  doses and developing a plan to avoid hypoglycemia and hyperglycemia. Please refer to Patient Instructions for Blood Glucose Monitoring and Insulin/Medications Dosing Guide"  in media tab for additional information. Please  also refer to " Patient Self Inventory" in the Media  tab for reviewed elements of pertinent patient history.  [name] participated in the discussions, expressed understanding, and voiced agreement with the above plans.  All questions were answered to his satisfaction. he is encouraged to contact clinic should he have any questions or concerns prior to his return visit.   Follow up plan: - Return in about 4 months (around 09/27/2019) for Bring Meter and Logs- A1c in Office.  Glade Lloyd, MD Phone: [phone]  Fax: [phone]   -  This note was partially dictated with voice recognition software. Similar sounding words can be transcribed inadequately or may not  be corrected upon review.  05/29/2019, 4:02 PM

## 2019-06-05 ENCOUNTER — Other Ambulatory Visit: Payer: Self-pay | Admitting: Cardiology

## 2019-06-05 ENCOUNTER — Other Ambulatory Visit: Payer: Self-pay | Admitting: "Endocrinology

## 2019-06-26 ENCOUNTER — Telehealth: Payer: Self-pay | Admitting: *Deleted

## 2019-06-26 MED ORDER — METOPROLOL SUCCINATE ER 50 MG PO TB24: 50.0000 mg | ORAL_TABLET | Freq: Two times a day (BID) | ORAL | 1 refills | Status: DC

## 2019-06-26 NOTE — Telephone Encounter (Signed)
Pt verified current dose and will increase Toprol XL 50 mg bid

## 2019-06-26 NOTE — Telephone Encounter (Signed)
Verify taking toprol 37.5mg  bid, if so incrase to 50mg  bid   Zandra Abts MD

## 2019-06-26 NOTE — Telephone Encounter (Signed)
Pt says for the last few day BP has been running higher than normal - denies any symptoms at this time BP readings are 152/93 159/92 144/92 159/72 HR 70-80

## 2019-06-30 DIAGNOSIS — H33022 Retinal detachment with multiple breaks, left eye: Secondary | ICD-10-CM | POA: Diagnosis not present

## 2019-07-04 ENCOUNTER — Other Ambulatory Visit: Payer: Self-pay | Admitting: "Endocrinology

## 2019-07-07 DIAGNOSIS — I10 Essential (primary) hypertension: Secondary | ICD-10-CM | POA: Diagnosis not present

## 2019-07-07 DIAGNOSIS — E7849 Other hyperlipidemia: Secondary | ICD-10-CM | POA: Diagnosis not present

## 2019-07-19 DIAGNOSIS — Z9889 Other specified postprocedural states: Secondary | ICD-10-CM | POA: Diagnosis not present

## 2019-07-19 DIAGNOSIS — R531 Weakness: Secondary | ICD-10-CM | POA: Diagnosis not present

## 2019-07-19 DIAGNOSIS — G1221 Amyotrophic lateral sclerosis: Secondary | ICD-10-CM | POA: Diagnosis not present

## 2019-07-19 DIAGNOSIS — R292 Abnormal reflex: Secondary | ICD-10-CM | POA: Diagnosis not present

## 2019-07-19 DIAGNOSIS — G1220 Motor neuron disease, unspecified: Secondary | ICD-10-CM | POA: Diagnosis not present

## 2019-07-29 DIAGNOSIS — Z23 Encounter for immunization: Secondary | ICD-10-CM | POA: Diagnosis not present

## 2019-08-03 DIAGNOSIS — I10 Essential (primary) hypertension: Secondary | ICD-10-CM | POA: Diagnosis not present

## 2019-08-03 DIAGNOSIS — E1165 Type 2 diabetes mellitus with hyperglycemia: Secondary | ICD-10-CM | POA: Diagnosis not present

## 2019-08-04 DIAGNOSIS — E1165 Type 2 diabetes mellitus with hyperglycemia: Secondary | ICD-10-CM | POA: Diagnosis not present

## 2019-08-04 DIAGNOSIS — I10 Essential (primary) hypertension: Secondary | ICD-10-CM | POA: Diagnosis not present

## 2019-08-16 DIAGNOSIS — E1165 Type 2 diabetes mellitus with hyperglycemia: Secondary | ICD-10-CM | POA: Diagnosis not present

## 2019-08-16 DIAGNOSIS — I739 Peripheral vascular disease, unspecified: Secondary | ICD-10-CM | POA: Diagnosis not present

## 2019-08-16 DIAGNOSIS — N183 Chronic kidney disease, stage 3 unspecified: Secondary | ICD-10-CM | POA: Diagnosis not present

## 2019-08-16 DIAGNOSIS — E782 Mixed hyperlipidemia: Secondary | ICD-10-CM | POA: Diagnosis not present

## 2019-08-16 DIAGNOSIS — I10 Essential (primary) hypertension: Secondary | ICD-10-CM | POA: Diagnosis not present

## 2019-08-16 LAB — MICROALBUMIN, URINE: Microalb, Ur: 138.6

## 2019-08-17 LAB — TSH: TSH: 3.05 (ref 0.41–5.90)

## 2019-08-20 ENCOUNTER — Other Ambulatory Visit: Payer: Self-pay | Admitting: "Endocrinology

## 2019-08-21 DIAGNOSIS — M1611 Unilateral primary osteoarthritis, right hip: Secondary | ICD-10-CM | POA: Diagnosis not present

## 2019-08-21 DIAGNOSIS — G1221 Amyotrophic lateral sclerosis: Secondary | ICD-10-CM | POA: Diagnosis not present

## 2019-08-21 DIAGNOSIS — Z6829 Body mass index (BMI) 29.0-29.9, adult: Secondary | ICD-10-CM | POA: Diagnosis not present

## 2019-08-21 DIAGNOSIS — E1122 Type 2 diabetes mellitus with diabetic chronic kidney disease: Secondary | ICD-10-CM | POA: Diagnosis not present

## 2019-08-21 DIAGNOSIS — I10 Essential (primary) hypertension: Secondary | ICD-10-CM | POA: Diagnosis not present

## 2019-08-21 DIAGNOSIS — I739 Peripheral vascular disease, unspecified: Secondary | ICD-10-CM | POA: Diagnosis not present

## 2019-08-21 DIAGNOSIS — E782 Mixed hyperlipidemia: Secondary | ICD-10-CM | POA: Diagnosis not present

## 2019-08-21 DIAGNOSIS — I25721 Atherosclerosis of autologous artery coronary artery bypass graft(s) with angina pectoris with documented spasm: Secondary | ICD-10-CM | POA: Diagnosis not present

## 2019-08-26 DIAGNOSIS — Z23 Encounter for immunization: Secondary | ICD-10-CM | POA: Diagnosis not present

## 2019-09-06 DIAGNOSIS — I10 Essential (primary) hypertension: Secondary | ICD-10-CM | POA: Diagnosis not present

## 2019-09-06 DIAGNOSIS — E039 Hypothyroidism, unspecified: Secondary | ICD-10-CM | POA: Diagnosis not present

## 2019-09-07 DIAGNOSIS — N183 Chronic kidney disease, stage 3 unspecified: Secondary | ICD-10-CM | POA: Diagnosis not present

## 2019-09-07 DIAGNOSIS — G1221 Amyotrophic lateral sclerosis: Secondary | ICD-10-CM | POA: Diagnosis not present

## 2019-09-07 DIAGNOSIS — E1165 Type 2 diabetes mellitus with hyperglycemia: Secondary | ICD-10-CM | POA: Diagnosis not present

## 2019-09-07 DIAGNOSIS — Z6829 Body mass index (BMI) 29.0-29.9, adult: Secondary | ICD-10-CM | POA: Diagnosis not present

## 2019-09-07 DIAGNOSIS — Z0001 Encounter for general adult medical examination with abnormal findings: Secondary | ICD-10-CM | POA: Diagnosis not present

## 2019-09-07 DIAGNOSIS — I10 Essential (primary) hypertension: Secondary | ICD-10-CM | POA: Diagnosis not present

## 2019-09-11 DIAGNOSIS — H40002 Preglaucoma, unspecified, left eye: Secondary | ICD-10-CM | POA: Diagnosis not present

## 2019-09-11 DIAGNOSIS — E119 Type 2 diabetes mellitus without complications: Secondary | ICD-10-CM | POA: Diagnosis not present

## 2019-09-11 DIAGNOSIS — H35372 Puckering of macula, left eye: Secondary | ICD-10-CM | POA: Diagnosis not present

## 2019-09-11 DIAGNOSIS — H33022 Retinal detachment with multiple breaks, left eye: Secondary | ICD-10-CM | POA: Diagnosis not present

## 2019-09-21 DIAGNOSIS — H401121 Primary open-angle glaucoma, left eye, mild stage: Secondary | ICD-10-CM | POA: Diagnosis not present

## 2019-09-21 DIAGNOSIS — H33322 Round hole, left eye: Secondary | ICD-10-CM | POA: Diagnosis not present

## 2019-09-21 DIAGNOSIS — E1165 Type 2 diabetes mellitus with hyperglycemia: Secondary | ICD-10-CM | POA: Diagnosis not present

## 2019-09-21 DIAGNOSIS — H33002 Unspecified retinal detachment with retinal break, left eye: Secondary | ICD-10-CM | POA: Diagnosis not present

## 2019-09-28 ENCOUNTER — Encounter: Payer: Self-pay | Admitting: "Endocrinology

## 2019-09-28 ENCOUNTER — Other Ambulatory Visit: Payer: Self-pay

## 2019-09-28 ENCOUNTER — Ambulatory Visit (INDEPENDENT_AMBULATORY_CARE_PROVIDER_SITE_OTHER): Payer: Medicare Other | Admitting: "Endocrinology

## 2019-09-28 VITALS — BP 146/78 | HR 69 | Ht 70.0 in | Wt 211.4 lb

## 2019-09-28 DIAGNOSIS — I10 Essential (primary) hypertension: Secondary | ICD-10-CM | POA: Diagnosis not present

## 2019-09-28 DIAGNOSIS — E1122 Type 2 diabetes mellitus with diabetic chronic kidney disease: Secondary | ICD-10-CM | POA: Diagnosis not present

## 2019-09-28 DIAGNOSIS — E782 Mixed hyperlipidemia: Secondary | ICD-10-CM

## 2019-09-28 DIAGNOSIS — N183 Chronic kidney disease, stage 3 unspecified: Secondary | ICD-10-CM | POA: Diagnosis not present

## 2019-09-28 LAB — POCT GLYCOSYLATED HEMOGLOBIN (HGB A1C): Hemoglobin A1C: 6.5 % — AB (ref 4.0–5.6)

## 2019-09-28 NOTE — Progress Notes (Signed)
09/28/2019  Endocrinology follow-up note   Subjective:    Patient ID: John Bishop, male    DOB: 1940/12/06. Patient is here for follow up in the management of type 2 diabetes complicated by stage 3-4 renal insufficiency, and hyperlipidemia.     Past Medical History:  Diagnosis Date  . Arthritis   . Diabetes mellitus, type II ([place])   . Fatty liver   . Hyperlipidemia   . Hypertension   . Kidney disease   . Progressive muscular atrophy ([place]) 04/27/2018   Past Surgical History:  Procedure Laterality Date  . COLONOSCOPY W/ POLYPECTOMY    . EYE SURGERY Bilateral    catarcts with lens  . MUSCLE BIOPSY Left 12/31/2017   Procedure: Left Deltoid Muscle Biopsy;  Surgeon: Ashok Pall, MD;  Location: [facility];  Service: Neurosurgery;  Laterality: Left;  Left Deltoid Muscle Biopsy  . OTHER SURGICAL HISTORY     growths removed R buttock1970, RU arm 2001, and R wrist 2009.  Marland Kitchen TOTAL HIP ARTHROPLASTY Right 12/14/2018   Procedure: TOTAL HIP ARTHROPLASTY ANTERIOR APPROACH;  Surgeon: Gaynelle Arabian, MD;  Location: [facility];  Service: Orthopedics;  Laterality: Right;  146min   Social History   Socioeconomic History  . Marital status: Married    Spouse name: Not on file  . Number of children: Not on file  . Years of education: Not on file  . Highest education level: Not on file  Occupational History  . Not on file  Tobacco Use  . Smoking status: Former Smoker    Quit date: 06/07/1986    Years since quitting: 33.3  . Smokeless tobacco: Never Used  Substance and Sexual Activity  . Alcohol use: No    Alcohol/week: 0.0 standard drinks    Comment: quit 1974  . Drug use: No  . Sexual activity: Not on file  Other Topics Concern  . Not on file  Social History Narrative   Lives home with wife, [name].  Education college.  One child in [place], [place].  Retired- Agricultural engineer.   Social Determinants of Health   Financial Resource Strain:   . Difficulty of Paying Living Expenses:   Food  Insecurity:   . Worried About Charity fundraiser in the Last Year:   . Arboriculturist in the Last Year:   Transportation Needs:   . Film/video editor (Medical):   Marland Kitchen Lack of Transportation (Non-Medical):   Physical Activity:   . Days of Exercise per Week:   . Minutes of Exercise per Session:   Stress:   . Feeling of Stress :   Social Connections:   . Frequency of Communication with Friends and Family:   . Frequency of Social Gatherings with Friends and Family:   . Attends Religious Services:   . Active Member of Clubs or Organizations:   . Attends Archivist Meetings:   Marland Kitchen Marital Status:    Outpatient Encounter Medications as of 09/28/2019  Medication Sig  . amLODipine (NORVASC) 10 MG tablet TAKE 1 TABLET BY MOUTH EVERY DAY  . aspirin EC 81 MG tablet Take 81 mg by mouth daily.  Marland Kitchen atorvastatin (LIPITOR) 40 MG tablet TAKE 1 TABLET BY MOUTH EVERY DAY  . BD PEN NEEDLE NANO 2ND GEN 32G X 4 MM MISC USE AS DIRECTED EVERY MORNING  . Cholecalciferol (VITAMIN D3 PO) Take 4,000 mg by mouth 2 (two) times daily.  . CONTOUR NEXT TEST test strip USE AS DIRECTED THREE TIMES A DAY  .  furosemide (LASIX) 20 MG tablet Take 20 mg by mouth daily.  . isosorbide mononitrate (IMDUR) 30 MG 24 hr tablet TAKE 1 TABLET BY MOUTH EVERY DAY  . latanoprost (XALATAN) 0.005 % ophthalmic solution Place 1 drop into the left eye at bedtime.  Marland Kitchen lisinopril (ZESTRIL) 20 MG tablet TAKE 1 TABLET BY MOUTH EVERY DAY  . metoprolol succinate (TOPROL-XL) 50 MG 24 hr tablet Take 1 tablet (50 mg total) by mouth 2 (two) times daily. Take with or immediately following a meal.  . Omega-3 Fatty Acids (FISH OIL) 1200 MG CAPS Take by mouth 2 (two) times daily.  . sodium bicarbonate 650 MG tablet Take 650 mg by mouth 3 (three) times daily.  [name] 100-3.6 UNIT-MG/ML SOPN INJECT 50 UNITS INTO THE SKIN DAILY WITH BREAKFAST.  . [DISCONTINUED] glipiZIDE (GLUCOTROL XL) 2.5 MG 24 hr tablet TAKE 1 TABLET BY MOUTH EVERY DAY  WITH BREAKFAST   No facility-administered encounter medications on file as of 09/28/2019.   ALLERGIES: No Known Allergies VACCINATION STATUS:  There is no immunization history on file for this patient.  Diabetes He presents for his follow-up diabetic visit. He has type 2 diabetes mellitus. Onset time: He was diagnosed at approximate age of 32 years. His disease course has been improving. There are no hypoglycemic associated symptoms. Pertinent negatives for hypoglycemia include no confusion, pallor or seizures. Pertinent negatives for diabetes include no fatigue, no polydipsia, no polyphagia, no polyuria and no weakness. There are no hypoglycemic complications. Symptoms are improving. Diabetic complications include nephropathy. Pertinent negatives for diabetic complications include no heart disease. Risk factors for coronary artery disease include diabetes mellitus, dyslipidemia, male sex and tobacco exposure. Current diabetic treatment includes insulin injections and oral agent (dual therapy). He is compliant with treatment most of the time. His weight is increasing steadily. He is following a generally healthy diet. He has not had a previous visit with a dietitian. He participates in exercise intermittently. His home blood glucose trend is decreasing steadily. His breakfast blood glucose range is generally 130-140 mg/dl. His overall blood glucose range is 140-180 mg/dl. An ACE inhibitor/angiotensin II receptor blocker is being taken. Eye exam is current (No retinopathy February 2019).  Hyperlipidemia This is a chronic problem. The current episode started more than 1 year ago. The problem is controlled. Exacerbating diseases include diabetes. Pertinent negatives include no myalgias. Current antihyperlipidemic treatment includes statins. Risk factors for coronary artery disease include dyslipidemia, diabetes mellitus, hypertension and male sex.     Review of systems  Constitutional: + Minimally  fluctuating body weight,  current  Body mass index is 30.33 kg/m. , no fatigue, no subjective hyperthermia, no subjective hypothermia Eyes: no blurry vision, no xerophthalmia ENT: no sore throat, no nodules palpated in throat, no dysphagia/odynophagia, no hoarseness Cardiovascular: no Chest Pain, no Shortness of Breath, no palpitations, no leg swelling Respiratory: no cough, no shortness of breath Gastrointestinal: no Nausea/Vomiting/Diarhhea Musculoskeletal: no muscle/joint aches Skin: no rashes, no hyperemia Neurological: no tremors, no numbness, no tingling, no dizziness Psychiatric: no depression, no anxiety   Objective:    BP (!) 146/78   Pulse 69   Ht 5\' 10"  (1.778 m)   Wt 211 lb 6.4 oz (95.9 kg)   BMI 30.33 kg/m   Wt Readings from Last 3 Encounters:  09/28/19 211 lb 6.4 oz (95.9 kg)  05/29/19 208 lb 12.8 oz (94.7 kg)  05/23/19 204 lb (92.5 kg)      Physical Exam- Limited  Constitutional:  Body mass index  is 30.33 kg/m. , not in acute distress, normal state of mind Eyes:  EOMI, no exophthalmos Neck: Supple Thyroid: No gross goiter Respiratory: Adequate breathing efforts Musculoskeletal: no gross deformities, strength intact in all four extremities, no gross restriction of joint movements Skin:  no rashes, no hyperemia Neurological: no tremor with outstretched hands,    CMP     Component Value Date/Time   NA 131 (L) 12/15/2018 0250   K 5.0 12/15/2018 0250   CL 103 12/15/2018 0250   CO2 20 (L) 12/15/2018 0250   GLUCOSE 313 (H) 12/15/2018 0250   BUN 23 12/15/2018 0250   BUN 32 (A) 09/05/2018 0000   CREATININE 1.45 (H) 12/15/2018 0250   CALCIUM 8.5 (L) 12/15/2018 0250   PROT 7.4 12/07/2018 1457   ALBUMIN 4.0 12/07/2018 1457   AST 24 12/07/2018 1457   ALT 27 12/07/2018 1457   ALKPHOS 86 12/07/2018 1457   BILITOT 0.8 12/07/2018 1457   GFRNONAA 46 (L) 12/15/2018 0250   GFRAA 53 (L) 12/15/2018 0250   Lipid Panel     Component Value Date/Time   CHOL 112  03/07/2018 0000   TRIG 74 03/07/2018 0000   HDL 38 03/07/2018 0000   LDLCALC 59 03/07/2018 0000   Diabetic Labs (most recent): Lab Results  Component Value Date   HGBA1C 6.5 (A) 09/28/2019   HGBA1C 7.6 (A) 05/29/2019   HGBA1C 7.5 (H) 12/07/2018    Completed labs from 07/24/2015 to be scanned into his records.  Assessment & Plan:   1. Diabetes mellitus with stage 3 chronic kidney disease ([place])  - Patient has currently controlled asymptomatic type 2 DM since  79 years of age. - He presents with near target fasting glycemic profile, point-of-care A1c today 6.5% improving from 7.6%.  No reported nor documented hypoglycemia.   -His diabetes is complicated by stage 3 CKD ( which is stable GFR between 44-54).  - He remains at a high risk for more acute and chronic complications of diabetes which include CAD, CVA, CKD, retinopathy, and neuropathy. These are all discussed in detail with the patient.  - I have counseled the patient on diet management and weight loss, by adopting a carbohydrate restricted/protein rich diet.  - he  admits there is a room for improvement in his diet and drink choices. -  Suggestion is made for him to avoid simple carbohydrates  from his diet including Cakes, Sweet Desserts / Pastries, Ice Cream, Soda (diet and regular), Sweet Tea, Candies, Chips, Cookies, Sweet Pastries,  Store Bought Juices, Alcohol in Excess of  1-2 drinks a day, Artificial Sweeteners, Coffee Creamer, and "Sugar-free" Products. This will help patient to have stable blood glucose profile and potentially avoid unintended weight gain.  - Patient is advised to stick to a routine mealtimes to eat 3 meals  a day and avoid unnecessary snacks ( to snack only to correct hypoglycemia).   - I have approached patient with the following individualized plan to manage diabetes and patient agrees:   -  He has benefited from simplified treatment regimen.  He is advised to continue  Xultophy  50/1.8  subcutaneously daily with breakfast, associated with strict monitoring of blood glucose daily before breakfast, and any time as needed.   - This product will help for both diabetes and weight control.  -He is encouraged  to call clinic for blood glucose levels less than 70 or above 200 mg /dl. -He has benefited from low-dose glipizide intervention.  He is advised to continue  glipizide 2.5 mg XL p.o. daily at breakfast. -Patient is not a candidate for metformin, SGLT2 inhibitors due to CKD- renal function is proving with a GFR of 47.     2) Lipids/HPL: His recent lipid panel showed controlled LDL at 59.  He is advised to continue atorvastatin 40 mg p.o. nightly.    3) hypertension: His blood pressure is not controlled to target. -He is on lisinopril 20 mg p.o. daily, isosorbide 30 mg p.o. daily, metoprolol 25 mg p.o. daily.  4) weight management: His current BMI is 30.3.  He is a candidate for some weight loss.  Exercise and carbs management discussed in detail with him.  - I advised patient to maintain close follow up with PMD  for primary care needs.   - Time spent on this patient care encounter:  35 min, of which > 50% was spent in  counseling and the rest reviewing his blood glucose logs , discussing his hypoglycemia and hyperglycemia episodes, reviewing his current and  previous labs / studies  ( including abstraction from other facilities) and medications  doses and developing a  long term treatment plan and documenting his care.   Please refer to Patient Instructions for Blood Glucose Monitoring and Insulin/Medications Dosing Guide"  in media tab for additional information. Please  also refer to " Patient Self Inventory" in the Media  tab for reviewed elements of pertinent patient history.  [name] participated in the discussions, expressed understanding, and voiced agreement with the above plans.  All questions were answered to his satisfaction. he is encouraged to contact  clinic should he have any questions or concerns prior to his return visit.    Follow up plan: - Return in about 6 months (around 03/29/2020), or he gets labs at nephro and PMD, for Bring Meter and Logs- A1c in Office.  Glade Lloyd, MD Phone: [phone]  Fax: [phone]   -  This note was partially dictated with voice recognition software. Similar sounding words can be transcribed inadequately or may not  be corrected upon review.  09/28/2019, 12:44 PM

## 2019-09-28 NOTE — Patient Instructions (Signed)

## 2019-10-02 ENCOUNTER — Other Ambulatory Visit: Payer: Self-pay | Admitting: "Endocrinology

## 2019-10-04 NOTE — Telephone Encounter (Signed)
done

## 2019-10-04 NOTE — Telephone Encounter (Signed)
Please advise 

## 2019-10-05 DIAGNOSIS — E1165 Type 2 diabetes mellitus with hyperglycemia: Secondary | ICD-10-CM | POA: Diagnosis not present

## 2019-10-05 DIAGNOSIS — I10 Essential (primary) hypertension: Secondary | ICD-10-CM | POA: Diagnosis not present

## 2019-10-06 DIAGNOSIS — Z471 Aftercare following joint replacement surgery: Secondary | ICD-10-CM | POA: Diagnosis not present

## 2019-10-06 DIAGNOSIS — I10 Essential (primary) hypertension: Secondary | ICD-10-CM | POA: Diagnosis not present

## 2019-10-06 DIAGNOSIS — E1165 Type 2 diabetes mellitus with hyperglycemia: Secondary | ICD-10-CM | POA: Diagnosis not present

## 2019-10-06 DIAGNOSIS — Z96641 Presence of right artificial hip joint: Secondary | ICD-10-CM | POA: Diagnosis not present

## 2019-10-10 DIAGNOSIS — N183 Chronic kidney disease, stage 3 unspecified: Secondary | ICD-10-CM | POA: Diagnosis not present

## 2019-10-10 DIAGNOSIS — E1165 Type 2 diabetes mellitus with hyperglycemia: Secondary | ICD-10-CM | POA: Diagnosis not present

## 2019-10-10 DIAGNOSIS — Z6829 Body mass index (BMI) 29.0-29.9, adult: Secondary | ICD-10-CM | POA: Diagnosis not present

## 2019-10-10 DIAGNOSIS — R600 Localized edema: Secondary | ICD-10-CM | POA: Diagnosis not present

## 2019-10-10 DIAGNOSIS — G1221 Amyotrophic lateral sclerosis: Secondary | ICD-10-CM | POA: Diagnosis not present

## 2019-10-17 DIAGNOSIS — E1122 Type 2 diabetes mellitus with diabetic chronic kidney disease: Secondary | ICD-10-CM | POA: Diagnosis not present

## 2019-10-17 DIAGNOSIS — I129 Hypertensive chronic kidney disease with stage 1 through stage 4 chronic kidney disease, or unspecified chronic kidney disease: Secondary | ICD-10-CM | POA: Diagnosis not present

## 2019-10-17 DIAGNOSIS — Z7984 Long term (current) use of oral hypoglycemic drugs: Secondary | ICD-10-CM | POA: Diagnosis not present

## 2019-10-17 DIAGNOSIS — G1225 Progressive spinal muscle atrophy: Secondary | ICD-10-CM | POA: Diagnosis not present

## 2019-10-17 DIAGNOSIS — N183 Chronic kidney disease, stage 3 unspecified: Secondary | ICD-10-CM | POA: Diagnosis not present

## 2019-10-17 DIAGNOSIS — G1221 Amyotrophic lateral sclerosis: Secondary | ICD-10-CM | POA: Diagnosis not present

## 2019-10-17 DIAGNOSIS — D649 Anemia, unspecified: Secondary | ICD-10-CM | POA: Diagnosis not present

## 2019-10-17 DIAGNOSIS — Z79899 Other long term (current) drug therapy: Secondary | ICD-10-CM | POA: Diagnosis not present

## 2019-10-17 DIAGNOSIS — Z7982 Long term (current) use of aspirin: Secondary | ICD-10-CM | POA: Diagnosis not present

## 2019-11-02 ENCOUNTER — Other Ambulatory Visit: Payer: Self-pay | Admitting: Cardiology

## 2019-11-02 DIAGNOSIS — N183 Chronic kidney disease, stage 3 unspecified: Secondary | ICD-10-CM | POA: Diagnosis not present

## 2019-11-02 DIAGNOSIS — I1310 Hypertensive heart and chronic kidney disease without heart failure, with stage 1 through stage 4 chronic kidney disease, or unspecified chronic kidney disease: Secondary | ICD-10-CM | POA: Diagnosis not present

## 2019-11-02 DIAGNOSIS — N189 Chronic kidney disease, unspecified: Secondary | ICD-10-CM | POA: Diagnosis not present

## 2019-11-23 ENCOUNTER — Ambulatory Visit (INDEPENDENT_AMBULATORY_CARE_PROVIDER_SITE_OTHER): Payer: Medicare Other | Admitting: Cardiology

## 2019-11-23 ENCOUNTER — Encounter: Payer: Self-pay | Admitting: Cardiology

## 2019-11-23 VITALS — BP 170/90 | HR 64 | Ht 70.0 in | Wt 207.8 lb

## 2019-11-23 DIAGNOSIS — I10 Essential (primary) hypertension: Secondary | ICD-10-CM

## 2019-11-23 DIAGNOSIS — R0602 Shortness of breath: Secondary | ICD-10-CM

## 2019-11-23 DIAGNOSIS — R0789 Other chest pain: Secondary | ICD-10-CM | POA: Diagnosis not present

## 2019-11-23 MED ORDER — HYDRALAZINE HCL 25 MG PO TABS
25.0000 mg | ORAL_TABLET | Freq: Two times a day (BID) | ORAL | 1 refills | Status: DC
Start: 2019-11-23 — End: 2020-01-23

## 2019-11-23 NOTE — Patient Instructions (Signed)
Your physician recommends that you schedule a follow-up appointment in: [place] has recommended you make the following change in your medication:   STOP AMLODIPINE   START HYDRALAZINE 25 MG TWICE DAILY   Your physician has requested that you have an echocardiogram. Echocardiography is a painless test that uses sound waves to create images of your heart. It provides your doctor with information about the size and shape of your heart and how well your heart's chambers and valves are working. This procedure takes approximately one hour. There are no restrictions for this procedure.  Thank you for choosing [facility]!!

## 2019-11-23 NOTE — Progress Notes (Signed)
Clinical Summary Mr. Criger is a 79 y.o.male seen today for follow up of the following medical problems.  1. Chest pain - several year history of chest pain - occurs with exertion only. Tightness mid to left chest, 7/10. No other associated symptoms. Comes on 4 laps around track, can keep going 6-9 laps. Then pain resolves. Overall stable in severity and frequency.   - 12/2015 lexiscan showed small area of mild ischemia inferior wall, LVEF 50%. Low risk study.  - 02/2016 echo LVEF 55-60%, no WMAs, grade I diastolic dysfunction CAD risk factors: DM2, hyperlipidemia, HTN, former tobacco, father MI 64s    - no recent chest pain.   2. CKD III -followed at [facility].  3. Hyperlipidemia - labs follow by pcp - compliant with statin  4. HTN - pcp stopped norvasc due to leg swelling  5. DM2 - followed by endocrinology Dr Dorris Fetch   6.PAD - history of tobacco abuse - no evidence of aneurysm by 05/2016 [place], did show some PAD. He denies claudication.   7. ALS - followed by neuro  8. LE edema - seen by nephrology, they increased his lasix to 40mg  daily.  - also norvasc was recently stopped  Past Medical History:  Diagnosis Date  . Arthritis   . Diabetes mellitus, type II ([place])   . Fatty liver   . Hyperlipidemia   . Hypertension   . Kidney disease   . Progressive muscular atrophy ([place]) 04/27/2018     No Known Allergies   Current Outpatient Medications  Medication Sig Dispense Refill  . amLODipine (NORVASC) 10 MG tablet TAKE 1 TABLET BY MOUTH EVERY DAY 90 tablet 3  . aspirin EC 81 MG tablet Take 81 mg by mouth daily.    Marland Kitchen atorvastatin (LIPITOR) 40 MG tablet TAKE 1 TABLET BY MOUTH EVERY DAY 90 tablet 3  . BD PEN NEEDLE NANO 2ND GEN 32G X 4 MM MISC USE AS DIRECTED EVERY MORNING 100 each 5  . Cholecalciferol (VITAMIN D3 PO) Take 4,000 mg by mouth 2 (two) times daily.    . CONTOUR NEXT TEST test strip USE AS DIRECTED THREE TIMES A DAY 100 each 1  . furosemide  (LASIX) 20 MG tablet Take 20 mg by mouth daily.    . isosorbide mononitrate (IMDUR) 30 MG 24 hr tablet TAKE 1 TABLET BY MOUTH EVERY DAY 90 tablet 1  . latanoprost (XALATAN) 0.005 % ophthalmic solution Place 1 drop into the left eye at bedtime.    Marland Kitchen lisinopril (ZESTRIL) 20 MG tablet TAKE 1 TABLET BY MOUTH EVERY DAY 90 tablet 3  . metoprolol succinate (TOPROL-XL) 50 MG 24 hr tablet Take 1 tablet (50 mg total) by mouth 2 (two) times daily. Take with or immediately following a meal. 180 tablet 1  . Omega-3 Fatty Acids (FISH OIL) 1200 MG CAPS Take by mouth 2 (two) times daily.    . sodium bicarbonate 650 MG tablet Take 650 mg by mouth 3 (three) times daily.    [name] 100-3.6 UNIT-MG/ML SOPN INJECT 50 UNITS INTO THE SKIN DAILY WITH BREAKFAST. 5 pen 2   No current facility-administered medications for this visit.     Past Surgical History:  Procedure Laterality Date  . COLONOSCOPY W/ POLYPECTOMY    . EYE SURGERY Bilateral    catarcts with lens  . MUSCLE BIOPSY Left 12/31/2017   Procedure: Left Deltoid Muscle Biopsy;  Surgeon: Ashok Pall, MD;  Location: [facility];  Service: Neurosurgery;  Laterality: Left;  Left Deltoid Muscle Biopsy  . OTHER SURGICAL HISTORY     growths removed R buttock1970, RU arm 2001, and R wrist 2009.  Marland Kitchen TOTAL HIP ARTHROPLASTY Right 12/14/2018   Procedure: TOTAL HIP ARTHROPLASTY ANTERIOR APPROACH;  Surgeon: Gaynelle Arabian, MD;  Location: [facility];  Service: Orthopedics;  Laterality: Right;  138min     No Known Allergies    Family History  Problem Relation Age of Onset  . Diabetes Other   . Heart disease Other   . Hyperlipidemia Other   . Hypertension Other   . Lung cancer Mother   . Hypertension Mother   . Diabetes Mother   . High Cholesterol Mother   . Heart disease Father   . Hypertension Father   . Diabetes Father   . High Cholesterol Father      Social History Mr. Habeeb reports that he quit smoking about 33 years ago. He has never used smokeless  tobacco. Mr. Ruscitti reports no history of alcohol use.   Review of Systems CONSTITUTIONAL: No weight loss, fever, chills, weakness or fatigue.  HEENT: Eyes: No visual loss, blurred vision, double vision or yellow sclerae.No hearing loss, sneezing, congestion, runny nose or sore throat.  SKIN: No rash or itching.  CARDIOVASCULAR: per hpi RESPIRATORY: No shortness of breath, cough or sputum.  GASTROINTESTINAL: No anorexia, nausea, vomiting or diarrhea. No abdominal pain or blood.  GENITOURINARY: No burning on urination, no polyuria NEUROLOGICAL: No headache, dizziness, syncope, paralysis, ataxia, numbness or tingling in the extremities. No change in bowel or bladder control.  MUSCULOSKELETAL: No muscle, back pain, joint pain or stiffness.  LYMPHATICS: No enlarged nodes. No history of splenectomy.  PSYCHIATRIC: No history of depression or anxiety.  ENDOCRINOLOGIC: No reports of sweating, cold or heat intolerance. No polyuria or polydipsia.  Marland Kitchen   Physical Examination Today's Vitals   11/23/19 1347  BP: (!) 170/90  Pulse: 64  SpO2: 98%  Weight: 207 lb 12.8 oz (94.3 kg)  Height: 5\' 10"  (1.778 m)   Body mass index is 29.82 kg/m.  Gen: resting comfortably, no acute distress HEENT: no scleral icterus, pupils equal round and reactive, no palptable cervical adenopathy,  CV: RRR, no m/r/g, no jvd Resp: Clear to auscultation bilaterally GI: abdomen is soft, non-tender, non-distended, normal bowel sounds, no hepatosplenomegaly MSK: extremities are warm, 1+ bilatearl LE edema Skin: warm, no rash Neuro:  no focal deficits Psych: appropriate affect   Diagnostic Studies     Assessment and Plan  1. Chest pain - no recent symptoms, continue to monitor  2. HTN - elevated bp's - norvasc recentlty stopped due to LE edema, unclear so far if related - start hydralazine 25mg  bid. Limited options given his renal dysfunction history   3. LE edema - unclear cause - we will obtain echo  to look for any cardiac dysfunction  norvasc just stopped, monitor for improvement - diuretics increased by nephrology, defer dosing to them      Arnoldo Lenis, M.D.

## 2019-12-02 ENCOUNTER — Other Ambulatory Visit: Payer: Self-pay | Admitting: "Endocrinology

## 2019-12-07 ENCOUNTER — Other Ambulatory Visit: Payer: Self-pay

## 2019-12-07 ENCOUNTER — Ambulatory Visit (INDEPENDENT_AMBULATORY_CARE_PROVIDER_SITE_OTHER): Payer: Medicare Other

## 2019-12-07 DIAGNOSIS — R0602 Shortness of breath: Secondary | ICD-10-CM | POA: Diagnosis not present

## 2019-12-19 ENCOUNTER — Telehealth: Payer: Self-pay | Admitting: *Deleted

## 2019-12-19 NOTE — Telephone Encounter (Signed)
-----   Message from Arnoldo Lenis, MD sent at 12/18/2019 12:48 PM EDT ----- Echo looks good, normal heart function   Zandra Abts MD

## 2019-12-19 NOTE — Telephone Encounter (Signed)
Pt aware - routed to pcp  

## 2020-01-01 ENCOUNTER — Other Ambulatory Visit: Payer: Self-pay | Admitting: "Endocrinology

## 2020-01-05 DIAGNOSIS — I129 Hypertensive chronic kidney disease with stage 1 through stage 4 chronic kidney disease, or unspecified chronic kidney disease: Secondary | ICD-10-CM | POA: Diagnosis not present

## 2020-01-05 DIAGNOSIS — E1122 Type 2 diabetes mellitus with diabetic chronic kidney disease: Secondary | ICD-10-CM | POA: Diagnosis not present

## 2020-01-05 DIAGNOSIS — I25721 Atherosclerosis of autologous artery coronary artery bypass graft(s) with angina pectoris with documented spasm: Secondary | ICD-10-CM | POA: Diagnosis not present

## 2020-01-05 DIAGNOSIS — N183 Chronic kidney disease, stage 3 unspecified: Secondary | ICD-10-CM | POA: Diagnosis not present

## 2020-01-05 DIAGNOSIS — Z87891 Personal history of nicotine dependence: Secondary | ICD-10-CM | POA: Diagnosis not present

## 2020-01-17 DIAGNOSIS — G1221 Amyotrophic lateral sclerosis: Secondary | ICD-10-CM | POA: Diagnosis not present

## 2020-01-17 DIAGNOSIS — R531 Weakness: Secondary | ICD-10-CM | POA: Diagnosis not present

## 2020-01-17 DIAGNOSIS — R292 Abnormal reflex: Secondary | ICD-10-CM | POA: Diagnosis not present

## 2020-01-23 ENCOUNTER — Ambulatory Visit (INDEPENDENT_AMBULATORY_CARE_PROVIDER_SITE_OTHER): Payer: Medicare Other | Admitting: Cardiology

## 2020-01-23 ENCOUNTER — Encounter: Payer: Self-pay | Admitting: Cardiology

## 2020-01-23 VITALS — BP 138/80 | HR 74 | Ht 70.0 in | Wt 207.2 lb

## 2020-01-23 DIAGNOSIS — I10 Essential (primary) hypertension: Secondary | ICD-10-CM | POA: Diagnosis not present

## 2020-01-23 DIAGNOSIS — R600 Localized edema: Secondary | ICD-10-CM

## 2020-01-23 MED ORDER — HYDRALAZINE HCL 50 MG PO TABS
50.0000 mg | ORAL_TABLET | Freq: Two times a day (BID) | ORAL | 1 refills | Status: DC
Start: 2020-01-23 — End: 2020-02-08

## 2020-01-23 NOTE — Patient Instructions (Signed)
Your physician recommends that you schedule a follow-up appointment in: [place] has recommended you make the following change in your medication:   INCREASE HYDRALAZINE 50 MG TWICE DAILY  RECORD BLOOD PRESSURE DAILY FOR 2 WEEKS - CHECK BLOOD PRESSURE AT THE SAME TIME DAILY AT LEAST 1 HOUR AFTER TAKING MEDICATIONS - CALL us WITH READINGS  Thank you for choosing [facility]!!

## 2020-01-23 NOTE — Progress Notes (Signed)
Clinical Summary John Bishop is a 79 y.o.male seen today for follow up of the following medical problems. This is a focused visit on recent issues with HTN and leg edema.   1. HTN - pcp stopped norvasc due to leg swelling - last visit we started hydralazine 25mg  bid.  - home bp's remain elevated    2. LE edema - seen by nephrology, they increased his lasix to 40mg  daily.  - also norvasc was recently stopped  - 12/2019 echo LVEF 55-60%, no WMAs, indet DDx - swelling much improved.    Past Medical History:  Diagnosis Date  . Arthritis   . Diabetes mellitus, type II ([place])   . Fatty liver   . Hyperlipidemia   . Hypertension   . Kidney disease   . Progressive muscular atrophy ([place]) 04/27/2018     No Known Allergies   Current Outpatient Medications  Medication Sig Dispense Refill  . aspirin EC 81 MG tablet Take 81 mg by mouth daily.    Marland Kitchen atorvastatin (LIPITOR) 40 MG tablet TAKE 1 TABLET BY MOUTH EVERY DAY 90 tablet 3  . BD PEN NEEDLE NANO 2ND GEN 32G X 4 MM MISC USE AS DIRECTED EVERY MORNING 100 each 5  . Cholecalciferol (VITAMIN D3 PO) Take 4,000 mg by mouth 2 (two) times daily.    . CONTOUR NEXT TEST test strip USE TO TEST BLOOD SUGAR 3 TIMES A DAY 100 strip 1  . furosemide (LASIX) 20 MG tablet Take 40 mg by mouth daily.     . hydrALAZINE (APRESOLINE) 25 MG tablet Take 1 tablet (25 mg total) by mouth in the morning and at bedtime. 180 tablet 1  . isosorbide mononitrate (IMDUR) 30 MG 24 hr tablet TAKE 1 TABLET BY MOUTH EVERY DAY 90 tablet 1  . latanoprost (XALATAN) 0.005 % ophthalmic solution Place 1 drop into the left eye at bedtime.    Marland Kitchen lisinopril (ZESTRIL) 20 MG tablet TAKE 1 TABLET BY MOUTH EVERY DAY 90 tablet 3  . metoprolol succinate (TOPROL-XL) 50 MG 24 hr tablet Take 1 tablet (50 mg total) by mouth 2 (two) times daily. Take with or immediately following a meal. 180 tablet 1  . Omega-3 Fatty Acids (FISH OIL) 1200 MG CAPS Take by mouth 2 (two) times daily.      . sodium bicarbonate 650 MG tablet Take 650 mg by mouth 3 (three) times daily.    [name] 100-3.6 UNIT-MG/ML SOPN INJECT 50 UNITS INTO THE SKIN DAILY WITH BREAKFAST 30 mL 0   No current facility-administered medications for this visit.     Past Surgical History:  Procedure Laterality Date  . COLONOSCOPY W/ POLYPECTOMY    . EYE SURGERY Bilateral    catarcts with lens  . MUSCLE BIOPSY Left 12/31/2017   Procedure: Left Deltoid Muscle Biopsy;  Surgeon: Ashok Pall, MD;  Location: [facility];  Service: Neurosurgery;  Laterality: Left;  Left Deltoid Muscle Biopsy  . OTHER SURGICAL HISTORY     growths removed R buttock1970, RU arm 2001, and R wrist 2009.  Marland Kitchen TOTAL HIP ARTHROPLASTY Right 12/14/2018   Procedure: TOTAL HIP ARTHROPLASTY ANTERIOR APPROACH;  Surgeon: Gaynelle Arabian, MD;  Location: [facility];  Service: Orthopedics;  Laterality: Right;  155min     No Known Allergies    Family History  Problem Relation Age of Onset  . Diabetes Other   . Heart disease Other   . Hyperlipidemia Other   . Hypertension Other   . Lung  cancer Mother   . Hypertension Mother   . Diabetes Mother   . High Cholesterol Mother   . Heart disease Father   . Hypertension Father   . Diabetes Father   . High Cholesterol Father      Social History John Bishop reports that he quit smoking about 33 years ago. He has never used smokeless tobacco. John Bishop reports no history of alcohol use.   Review of Systems CONSTITUTIONAL: No weight loss, fever, chills, weakness or fatigue.  HEENT: Eyes: No visual loss, blurred vision, double vision or yellow sclerae.No hearing loss, sneezing, congestion, runny nose or sore throat.  SKIN: No rash or itching.  CARDIOVASCULAR: per hpi RESPIRATORY: No shortness of breath, cough or sputum.  GASTROINTESTINAL: No anorexia, nausea, vomiting or diarrhea. No abdominal pain or blood.  GENITOURINARY: No burning on urination, no polyuria NEUROLOGICAL: No headache, dizziness, syncope,  paralysis, ataxia, numbness or tingling in the extremities. No change in bowel or bladder control.  MUSCULOSKELETAL: No muscle, back pain, joint pain or stiffness.  LYMPHATICS: No enlarged nodes. No history of splenectomy.  PSYCHIATRIC: No history of depression or anxiety.  ENDOCRINOLOGIC: No reports of sweating, cold or heat intolerance. No polyuria or polydipsia.  Marland Kitchen   Physical Examination Today's Vitals   01/23/20 0942  BP: 138/80  Pulse: 74  SpO2: 95%  Weight: 207 lb 3.2 oz (94 kg)  Height: 5\' 10"  (1.778 m)   Body mass index is 29.73 kg/m.  Gen: resting comfortably, no acute distress HEENT: no scleral icterus, pupils equal round and reactive, no palptable cervical adenopathy,  CV: RRR, no m/r/g, no jvd Resp: Clear to auscultation bilaterally GI: abdomen is soft, non-tender, non-distended, normal bowel sounds, no hepatosplenomegaly MSK: extremities are warm, no edema.  Skin: warm, no rash Neuro:  no focal deficits Psych: appropriate affect   Diagnostic Studies  12/2019 echo IMPRESSIONS    1. Left ventricular ejection fraction, by estimation, is 55 to 60%. The  left ventricle has normal function. The left ventricle has no regional  wall motion abnormalities. Left ventricular diastolic parameters are  indeterminate.  2. Right ventricular systolic function is normal. The right ventricular  size is normal. There is normal pulmonary artery systolic pressure. The  estimated right ventricular systolic pressure is 76.5 mmHg.  3. The mitral valve is grossly normal. Mild mitral valve regurgitation.  4. The aortic valve is tricuspid, mild annular calcification. Aortic  valve regurgitation is not visualized.  5. The inferior vena cava is normal in size with greater than 50%  respiratory variability, suggesting right atrial pressure of 3 mmHg.    Assessment and Plan  1. HTN - remains above goal, increase hydralazine to 50mg  bid - he is to call us with home bp's in 2  weeks   2. LE edema - echo without significant change - improved off norvasc with higher lasix dosing - continue to monitor.    F/u 3 months   Arnoldo Lenis, M.D.

## 2020-02-06 DIAGNOSIS — Z87891 Personal history of nicotine dependence: Secondary | ICD-10-CM | POA: Diagnosis not present

## 2020-02-06 DIAGNOSIS — I25721 Atherosclerosis of autologous artery coronary artery bypass graft(s) with angina pectoris with documented spasm: Secondary | ICD-10-CM | POA: Diagnosis not present

## 2020-02-06 DIAGNOSIS — E1122 Type 2 diabetes mellitus with diabetic chronic kidney disease: Secondary | ICD-10-CM | POA: Diagnosis not present

## 2020-02-06 DIAGNOSIS — I129 Hypertensive chronic kidney disease with stage 1 through stage 4 chronic kidney disease, or unspecified chronic kidney disease: Secondary | ICD-10-CM | POA: Diagnosis not present

## 2020-02-06 DIAGNOSIS — N183 Chronic kidney disease, stage 3 unspecified: Secondary | ICD-10-CM | POA: Diagnosis not present

## 2020-02-07 ENCOUNTER — Telehealth: Payer: Self-pay | Admitting: Cardiology

## 2020-02-07 NOTE — Telephone Encounter (Signed)
Calling to give BP readings   [phone]

## 2020-02-07 NOTE — Telephone Encounter (Signed)
01/24/20 133/84 01/25/20 141/84 01/26/20 128/76 01/27/20 121/74 01/28/20 not taken 01/29/20 133/80 01/30/20 149/89 01/31/20 150/94 02/01/20 160/90 02/02/20 145/86 02/03/20 134/83 02/04/20 not taken 02/05/20 131/79 02/06/20 150/89 02/07/20 150/89  Took all readings at same time, same arm, same cuff Did not miss any doses of medications Did not change diet Medications reviewed

## 2020-02-08 MED ORDER — HYDRALAZINE HCL 50 MG PO TABS
75.0000 mg | ORAL_TABLET | Freq: Two times a day (BID) | ORAL | 1 refills | Status: DC
Start: 2020-02-08 — End: 2020-04-18

## 2020-02-08 NOTE — Telephone Encounter (Signed)
Bp's on average too high, would increase hydralazine to 75mg  bid   Zandra Abts MD

## 2020-02-08 NOTE — Telephone Encounter (Signed)
Patient informed and verbalized understanding of plan. 

## 2020-02-09 ENCOUNTER — Other Ambulatory Visit: Payer: Self-pay | Admitting: Cardiology

## 2020-02-16 DIAGNOSIS — E1165 Type 2 diabetes mellitus with hyperglycemia: Secondary | ICD-10-CM | POA: Diagnosis not present

## 2020-02-16 DIAGNOSIS — E782 Mixed hyperlipidemia: Secondary | ICD-10-CM | POA: Diagnosis not present

## 2020-02-16 DIAGNOSIS — I10 Essential (primary) hypertension: Secondary | ICD-10-CM | POA: Diagnosis not present

## 2020-02-16 LAB — BASIC METABOLIC PANEL
BUN: 22 — AB (ref 4–21)
Creatinine: 1.5 — AB (ref 0.6–1.3)

## 2020-02-16 LAB — COMPREHENSIVE METABOLIC PANEL
Calcium: 9.4 (ref 8.7–10.7)
GFR calc Af Amer: 53
GFR calc non Af Amer: 45

## 2020-02-16 LAB — HEMOGLOBIN A1C: Hemoglobin A1C: 6.6

## 2020-02-17 LAB — LIPID PANEL
Cholesterol: 135 (ref 0–200)
HDL: 39 (ref 35–70)
LDL Cholesterol: 78
Triglycerides: 93 (ref 40–160)

## 2020-02-20 DIAGNOSIS — E1122 Type 2 diabetes mellitus with diabetic chronic kidney disease: Secondary | ICD-10-CM | POA: Diagnosis not present

## 2020-02-20 DIAGNOSIS — I10 Essential (primary) hypertension: Secondary | ICD-10-CM | POA: Diagnosis not present

## 2020-02-20 DIAGNOSIS — E1165 Type 2 diabetes mellitus with hyperglycemia: Secondary | ICD-10-CM | POA: Diagnosis not present

## 2020-02-20 DIAGNOSIS — E782 Mixed hyperlipidemia: Secondary | ICD-10-CM | POA: Diagnosis not present

## 2020-02-20 DIAGNOSIS — N183 Chronic kidney disease, stage 3 unspecified: Secondary | ICD-10-CM | POA: Diagnosis not present

## 2020-02-20 DIAGNOSIS — G1221 Amyotrophic lateral sclerosis: Secondary | ICD-10-CM | POA: Diagnosis not present

## 2020-02-20 DIAGNOSIS — I25721 Atherosclerosis of autologous artery coronary artery bypass graft(s) with angina pectoris with documented spasm: Secondary | ICD-10-CM | POA: Diagnosis not present

## 2020-02-20 DIAGNOSIS — Z6829 Body mass index (BMI) 29.0-29.9, adult: Secondary | ICD-10-CM | POA: Diagnosis not present

## 2020-03-01 ENCOUNTER — Other Ambulatory Visit: Payer: Self-pay | Admitting: "Endocrinology

## 2020-03-05 ENCOUNTER — Other Ambulatory Visit: Payer: Self-pay | Admitting: "Endocrinology

## 2020-03-06 ENCOUNTER — Other Ambulatory Visit: Payer: Self-pay | Admitting: "Endocrinology

## 2020-03-06 NOTE — Telephone Encounter (Signed)
Can you call pt about his insulin refill he keeps requesting. It says denied because the refill is not appropriate.

## 2020-03-06 NOTE — Telephone Encounter (Signed)
done

## 2020-03-07 DIAGNOSIS — I129 Hypertensive chronic kidney disease with stage 1 through stage 4 chronic kidney disease, or unspecified chronic kidney disease: Secondary | ICD-10-CM | POA: Diagnosis not present

## 2020-03-07 DIAGNOSIS — N183 Chronic kidney disease, stage 3 unspecified: Secondary | ICD-10-CM | POA: Diagnosis not present

## 2020-03-07 DIAGNOSIS — E1122 Type 2 diabetes mellitus with diabetic chronic kidney disease: Secondary | ICD-10-CM | POA: Diagnosis not present

## 2020-03-12 DIAGNOSIS — Z23 Encounter for immunization: Secondary | ICD-10-CM | POA: Diagnosis not present

## 2020-03-13 ENCOUNTER — Other Ambulatory Visit: Payer: Self-pay | Admitting: "Endocrinology

## 2020-03-21 DIAGNOSIS — N183 Chronic kidney disease, stage 3 unspecified: Secondary | ICD-10-CM | POA: Diagnosis not present

## 2020-03-21 DIAGNOSIS — I129 Hypertensive chronic kidney disease with stage 1 through stage 4 chronic kidney disease, or unspecified chronic kidney disease: Secondary | ICD-10-CM | POA: Diagnosis not present

## 2020-03-21 DIAGNOSIS — Z79899 Other long term (current) drug therapy: Secondary | ICD-10-CM | POA: Diagnosis not present

## 2020-03-29 ENCOUNTER — Encounter: Payer: Self-pay | Admitting: Nurse Practitioner

## 2020-03-29 ENCOUNTER — Ambulatory Visit (INDEPENDENT_AMBULATORY_CARE_PROVIDER_SITE_OTHER): Payer: Medicare Other | Admitting: Nurse Practitioner

## 2020-03-29 ENCOUNTER — Other Ambulatory Visit: Payer: Self-pay

## 2020-03-29 VITALS — BP 128/76 | HR 73 | Ht 70.0 in | Wt 203.4 lb

## 2020-03-29 DIAGNOSIS — E782 Mixed hyperlipidemia: Secondary | ICD-10-CM

## 2020-03-29 DIAGNOSIS — I10 Essential (primary) hypertension: Secondary | ICD-10-CM

## 2020-03-29 DIAGNOSIS — N183 Chronic kidney disease, stage 3 unspecified: Secondary | ICD-10-CM

## 2020-03-29 DIAGNOSIS — E1122 Type 2 diabetes mellitus with diabetic chronic kidney disease: Secondary | ICD-10-CM

## 2020-03-29 MED ORDER — ACCU-CHEK GUIDE VI STRP: ORAL_STRIP | 12 refills | Status: DC

## 2020-03-29 NOTE — Progress Notes (Signed)
03/29/2020  Endocrinology follow-up note   Subjective:    Patient ID: John Bishop, male    DOB: 20-Nov-1940. Patient is here for follow up in the management of type 2 diabetes complicated by stage 3-4 renal insufficiency, and hyperlipidemia.     Past Medical History:  Diagnosis Date  . Arthritis   . Diabetes mellitus, type II ([place])   . Fatty liver   . Hyperlipidemia   . Hypertension   . Kidney disease   . Progressive muscular atrophy ([place]) 04/27/2018   Past Surgical History:  Procedure Laterality Date  . COLONOSCOPY W/ POLYPECTOMY    . EYE SURGERY Bilateral    catarcts with lens  . MUSCLE BIOPSY Left 12/31/2017   Procedure: Left Deltoid Muscle Biopsy;  Surgeon: Ashok Pall, MD;  Location: [facility];  Service: Neurosurgery;  Laterality: Left;  Left Deltoid Muscle Biopsy  . OTHER SURGICAL HISTORY     growths removed R buttock1970, RU arm 2001, and R wrist 2009.  Marland Kitchen TOTAL HIP ARTHROPLASTY Right 12/14/2018   Procedure: TOTAL HIP ARTHROPLASTY ANTERIOR APPROACH;  Surgeon: Gaynelle Arabian, MD;  Location: [facility];  Service: Orthopedics;  Laterality: Right;  153min   Social History   Socioeconomic History  . Marital status: Married    Spouse name: Not on file  . Number of children: Not on file  . Years of education: Not on file  . Highest education level: Not on file  Occupational History  . Not on file  Tobacco Use  . Smoking status: Former Smoker    Quit date: 06/07/1986    Years since quitting: 33.8  . Smokeless tobacco: Never Used  Vaping Use  . Vaping Use: Never used  Substance and Sexual Activity  . Alcohol use: No    Alcohol/week: 0.0 standard drinks    Comment: quit 1974  . Drug use: No  . Sexual activity: Not on file  Other Topics Concern  . Not on file  Social History Narrative   Lives home with wife, John Bishop.  Education college.  One child in [place], [place].  Retired- Agricultural engineer.   Social Determinants of Health   Financial Resource Strain:   .  Difficulty of Paying Living Expenses: Not on file  Food Insecurity:   . Worried About Charity fundraiser in the Last Year: Not on file  . Ran Out of Food in the Last Year: Not on file  Transportation Needs:   . Lack of Transportation (Medical): Not on file  . Lack of Transportation (Non-Medical): Not on file  Physical Activity:   . Days of Exercise per Week: Not on file  . Minutes of Exercise per Session: Not on file  Stress:   . Feeling of Stress : Not on file  Social Connections:   . Frequency of Communication with Friends and Family: Not on file  . Frequency of Social Gatherings with Friends and Family: Not on file  . Attends Religious Services: Not on file  . Active Member of Clubs or Organizations: Not on file  . Attends Archivist Meetings: Not on file  . Marital Status: Not on file   Outpatient Encounter Medications as of 03/29/2020  Medication Sig  . aspirin EC 81 MG tablet Take 81 mg by mouth daily.  Marland Kitchen atorvastatin (LIPITOR) 40 MG tablet TAKE 1 TABLET BY MOUTH EVERY DAY  . BD PEN NEEDLE NANO 2ND GEN 32G X 4 MM MISC USE AS DIRECTED EVERY MORNING  . Cholecalciferol (VITAMIN D3 PO)  Take 4,000 mg by mouth 2 (two) times daily.  . furosemide (LASIX) 20 MG tablet Take 40 mg by mouth daily.   . hydrALAZINE (APRESOLINE) 50 MG tablet Take 1.5 tablets (75 mg total) by mouth 2 (two) times daily.  . isosorbide mononitrate (IMDUR) 30 MG 24 hr tablet TAKE 1 TABLET BY MOUTH EVERY DAY  . latanoprost (XALATAN) 0.005 % ophthalmic solution Place 1 drop into the left eye at bedtime.  Marland Kitchen lisinopril (ZESTRIL) 20 MG tablet TAKE 1 TABLET BY MOUTH EVERY DAY  . metoprolol succinate (TOPROL-XL) 50 MG 24 hr tablet TAKE 1 TABLET BY MOUTH 2 (TWO) TIMES DAILY. TAKE WITH OR IMMEDIATELY FOLLOWING A MEAL.  Marland Kitchen Omega-3 Fatty Acids (FISH OIL) 1200 MG CAPS Take by mouth 2 (two) times daily.  . sodium bicarbonate 650 MG tablet Take 650 mg by mouth 3 (three) times daily.  [name] 100-3.6 UNIT-MG/ML  SOPN INJECT 50 UNITS INTO THE SKIN DAILY WITH BREAKFAST  . [DISCONTINUED] CONTOUR NEXT TEST test strip USE TO TEST BLOOD SUGAR 3 TIMES A DAY  . glucose blood (ACCU-CHEK GUIDE) test strip Use as instructed   No facility-administered encounter medications on file as of 03/29/2020.   ALLERGIES: No Known Allergies VACCINATION STATUS:  There is no immunization history on file for this patient.  Diabetes He presents for his follow-up diabetic visit. He has type 2 diabetes mellitus. Onset time: He was diagnosed at approximate age of 60 years. His disease course has been stable. There are no hypoglycemic associated symptoms. Pertinent negatives for hypoglycemia include no confusion, pallor or seizures. Pertinent negatives for diabetes include no fatigue, no polydipsia, no polyphagia, no polyuria and no weakness. There are no hypoglycemic complications. Symptoms are stable. Diabetic complications include nephropathy. Pertinent negatives for diabetic complications include no heart disease. Risk factors for coronary artery disease include diabetes mellitus, dyslipidemia, male sex, tobacco exposure and hypertension. Current diabetic treatment includes insulin injections. He is compliant with treatment all of the time. His weight is fluctuating minimally. He is following a generally healthy diet. Meal planning includes avoidance of concentrated sweets. He has not had a previous visit with a dietitian. He participates in exercise intermittently. His home blood glucose trend is fluctuating minimally. His breakfast blood glucose range is generally 90-110 mg/dl. His overall blood glucose range is 130-140 mg/dl. (He presents today with his meter and logs showing at goal fasting and postprandial glycemic profile.  His previsit A1C was 6.6% on 02/16/20, unchanged from previous visit.  He stopped taking his Glipizide about 1 year ago because he thought it was affecting his kidney function.  He denies any major episodes of  hypoglycemia.) An ACE inhibitor/angiotensin II receptor blocker is being taken. He does not see a podiatrist.Eye exam is current.  Hyperlipidemia This is a chronic problem. The current episode started more than 1 year ago. The problem is controlled. Recent lipid tests were reviewed and are normal. Exacerbating diseases include diabetes. There are no known factors aggravating his hyperlipidemia. Pertinent negatives include no myalgias. Current antihyperlipidemic treatment includes statins. The current treatment provides moderate improvement of lipids. There are no compliance problems.  Risk factors for coronary artery disease include dyslipidemia, diabetes mellitus, hypertension and male sex.    Review of systems  Constitutional: + Minimally fluctuating body weight,  current Body mass index is 29.18 kg/m. , no fatigue, no subjective hyperthermia, no subjective hypothermia Eyes: no blurry vision, no xerophthalmia ENT: no sore throat, no nodules palpated in throat, no dysphagia/odynophagia, no hoarseness Cardiovascular:  no chest pain, no shortness of breath, no palpitations, no leg swelling Respiratory: no cough, no shortness of breath Gastrointestinal: no nausea/vomiting/diarrhea Musculoskeletal: no muscle/joint aches Skin: no rashes, no hyperemia Neurological: no tremors, no numbness, no tingling, no dizziness Psychiatric: no depression, no anxiety   Objective:    BP 128/76 (BP Location: Right Arm, Patient Position: Sitting)   Pulse 73   Ht 5\' 10"  (1.778 m)   Wt 203 lb 6.4 oz (92.3 kg)   BMI 29.18 kg/m   Wt Readings from Last 3 Encounters:  03/29/20 203 lb 6.4 oz (92.3 kg)  01/23/20 207 lb 3.2 oz (94 kg)  11/23/19 207 lb 12.8 oz (94.3 kg)    BP Readings from Last 3 Encounters:  03/29/20 128/76  01/23/20 138/80  11/23/19 (!) 170/90     Physical Exam- Limited  Constitutional:  Body mass index is 29.18 kg/m. , not in acute distress, normal state of mind Eyes:  EOMI, no  exophthalmos Neck: Supple Thyroid: No gross goiter Cardiovascular: RRR, no murmers, rubs, or gallops, no edema Respiratory: Adequate breathing efforts, no crackles, rales, rhonchi, or wheezing Musculoskeletal: no gross deformities, strength intact in all four extremities, no gross restriction of joint movements Skin:  no rashes, no hyperemia Neurological: no tremor with outstretched hands   CMP     Component Value Date/Time   NA 131 (L) 12/15/2018 0250   K 5.0 12/15/2018 0250   CL 103 12/15/2018 0250   CO2 20 (L) 12/15/2018 0250   GLUCOSE 313 (H) 12/15/2018 0250   BUN 22 (A) 02/16/2020 0000   CREATININE 1.5 (A) 02/16/2020 0000   CREATININE 1.45 (H) 12/15/2018 0250   CALCIUM 9.4 02/16/2020 0000   PROT 7.4 12/07/2018 1457   ALBUMIN 4.0 12/07/2018 1457   AST 24 12/07/2018 1457   ALT 27 12/07/2018 1457   ALKPHOS 86 12/07/2018 1457   BILITOT 0.8 12/07/2018 1457   GFRNONAA 45 02/16/2020 0000   GFRAA 53 02/16/2020 0000   Lipid Panel     Component Value Date/Time   CHOL 135 02/16/2020 0000   TRIG 93 02/16/2020 0000   HDL 39 02/16/2020 0000   LDLCALC 78 02/16/2020 0000   Diabetic Labs (most recent): Lab Results  Component Value Date   HGBA1C 6.6 02/16/2020   HGBA1C 6.5 (A) 09/28/2019   HGBA1C 7.6 (A) 05/29/2019    Completed labs from 07/24/2015 to be scanned into his records.  Assessment & Plan:   1. Diabetes mellitus with stage 3 chronic kidney disease ([place])  - Patient has currently controlled asymptomatic type 2 DM since  79 years of age.  He presents today with his meter and logs showing at goal fasting and postprandial glycemic profile.  His previsit A1C was 6.6% on 02/16/20, unchanged from previous visit.  He stopped taking his Glipizide about 1 year ago because he thought it was affecting his kidney function.  He denies any major episodes of hypoglycemia.   -His diabetes is complicated by stage 3 CKD ( which is stable GFR between 43-54).  - He remains at a high  risk for more acute and chronic complications of diabetes which include CAD, CVA, CKD, retinopathy, and neuropathy. These are all discussed in detail with the patient.  - Nutritional counseling repeated at each appointment due to patients tendency to fall back in to old habits.  - The patient admits there is a room for improvement in their diet and drink choices. -  Suggestion is made for the patient to avoid  simple carbohydrates from their diet including Cakes, Sweet Desserts / Pastries, Ice Cream, Soda (diet and regular), Sweet Tea, Candies, Chips, Cookies, Sweet Pastries,  Store Bought Juices, Alcohol in Excess of  1-2 drinks a day, Artificial Sweeteners, Coffee Creamer, and "Sugar-free" Products. This will help patient to have stable blood glucose profile and potentially avoid unintended weight gain.   - I encouraged the patient to switch to  unprocessed or minimally processed complex starch and increased protein intake (animal or plant source), fruits, and vegetables.   - Patient is advised to stick to a routine mealtimes to eat 3 meals  a day and avoid unnecessary snacks ( to snack only to correct hypoglycemia).  - I have approached patient with the following individualized plan to manage diabetes and patient agrees:   - Given his continued control of his diabetes to target, he is advised to continue Xultophy 100/3.6 50 units SQ daily with breakfast. This product will help for both diabetes and weight control.  -He is encouraged to continue monitoring blood glucose at least twice daily, before breakfast and before bed, and call the clinic if he has readings less than 70 or greater than 200 for 3 tests in a row.  (He was given a sample meter Accu Chek Guide Me from the office today due to his reader being grossly outdated).   -Patient is not a candidate for metformin, SGLT2 inhibitors due to CKD- renal function is proving with a GFR of 47.    2) Lipids/HPL: His most recent lipid panel from  02/16/20 shows controlled LDL of 78.  He is advised to continue Atorvastatin 40 mg po daily at bedtime.  Side effects and precautions discussed with him.  3) Hypertension: His blood pressure is controlled to target.  He is advised to continue Lasix 40 mg po daily, Hydralazine 75 mg po twice daily, Imdur 30 mg po daily, Lisinopril 20 mg po daily, and Metoprolol 50 mg po daily.  4) Weight management: His Body mass index is 29.18 kg/m.--- not a candidate for major weight loss. Exercise and carbs management discussed in detail with him.  - I advised patient to maintain close follow up with his PCP for primary care needs.   - Time spent on this patient care encounter:  35 min, of which > 50% was spent in  counseling and the rest reviewing his blood glucose logs , discussing his hypoglycemia and hyperglycemia episodes, reviewing his current and  previous labs / studies  ( including abstraction from other facilities) and medications  doses and developing a  long term treatment plan and documenting his care.   Please refer to Patient Instructions for Blood Glucose Monitoring and Insulin/Medications Dosing Guide"  in media tab for additional information. Please  also refer to " Patient Self Inventory" in the Media  tab for reviewed elements of pertinent patient history.  [name] participated in the discussions, expressed understanding, and voiced agreement with the above plans.  All questions were answered to his satisfaction. he is encouraged to contact clinic should he have any questions or concerns prior to his return visit.    Follow up plan: - Return in about 6 months (around 09/27/2020) for Diabetes follow up with A1c in office, Previsit labs, Bring glucometer and logs.  Rayetta Pigg, [facility] [address] [place], [place] [address] Phone: [phone] Fax: [phone]  03/29/2020, 11:08 AM

## 2020-03-29 NOTE — Patient Instructions (Signed)

## 2020-03-30 ENCOUNTER — Encounter: Payer: Self-pay | Admitting: Nurse Practitioner

## 2020-04-01 ENCOUNTER — Other Ambulatory Visit: Payer: Self-pay

## 2020-04-01 DIAGNOSIS — Z23 Encounter for immunization: Secondary | ICD-10-CM | POA: Diagnosis not present

## 2020-04-01 DIAGNOSIS — N183 Chronic kidney disease, stage 3 unspecified: Secondary | ICD-10-CM

## 2020-04-01 MED ORDER — ACCU-CHEK GUIDE VI STRP: ORAL_STRIP | 3 refills | Status: DC

## 2020-04-01 MED ORDER — ACCU-CHEK SOFTCLIX LANCETS MISC: 3 refills | Status: AC

## 2020-04-06 DIAGNOSIS — E1122 Type 2 diabetes mellitus with diabetic chronic kidney disease: Secondary | ICD-10-CM | POA: Diagnosis not present

## 2020-04-06 DIAGNOSIS — N183 Chronic kidney disease, stage 3 unspecified: Secondary | ICD-10-CM | POA: Diagnosis not present

## 2020-04-06 DIAGNOSIS — I129 Hypertensive chronic kidney disease with stage 1 through stage 4 chronic kidney disease, or unspecified chronic kidney disease: Secondary | ICD-10-CM | POA: Diagnosis not present

## 2020-04-18 ENCOUNTER — Ambulatory Visit (INDEPENDENT_AMBULATORY_CARE_PROVIDER_SITE_OTHER): Payer: Medicare Other | Admitting: Cardiology

## 2020-04-18 ENCOUNTER — Encounter: Payer: Self-pay | Admitting: Cardiology

## 2020-04-18 VITALS — BP 130/64 | HR 78 | Ht 70.0 in | Wt 202.0 lb

## 2020-04-18 DIAGNOSIS — R600 Localized edema: Secondary | ICD-10-CM

## 2020-04-18 DIAGNOSIS — E782 Mixed hyperlipidemia: Secondary | ICD-10-CM

## 2020-04-18 DIAGNOSIS — I10 Essential (primary) hypertension: Secondary | ICD-10-CM

## 2020-04-18 MED ORDER — HYDRALAZINE HCL 25 MG PO TABS: 25.0000 mg | ORAL_TABLET | Freq: Three times a day (TID) | ORAL | 3 refills | Status: DC

## 2020-04-18 MED ORDER — HYDRALAZINE HCL 50 MG PO TABS: 50.0000 mg | ORAL_TABLET | Freq: Two times a day (BID) | ORAL | 1 refills | Status: DC

## 2020-04-18 MED ORDER — HYDRALAZINE HCL 25 MG PO TABS: 25.0000 mg | ORAL_TABLET | Freq: Two times a day (BID) | ORAL | 1 refills | Status: DC

## 2020-04-18 NOTE — Patient Instructions (Signed)
Your physician wants you to follow-up in: [place] will receive a reminder letter in the mail two months in advance. If you don't receive a letter, please call our office to schedule the follow-up appointment.  Your physician has recommended you make the following change in your medication:   UPDATED YOUR HYDRALAZINE:  TAKE 50 MG TWICE DAILY   TAKE 25 MG TWICE DAILY   Thank you for choosing [facility]!!

## 2020-04-18 NOTE — Progress Notes (Signed)
Clinical Summary John Bishop is a 79 y.o.male seen today for follow up of the following medical problems.   1. HTN - pcp stopped norvasc due toleg swelling - last visit we started hydralazine 25mg  bid.  - home bp's remain elevated  - most recently we increased hydralazine to 75mg  bid - home bp's 110s-130s/60s-80s  2. LE edema - seen by nephrology, they increased his lasix to 40mg  daily. - also norvasc was recently stopped  - 12/2019 echo LVEF 55-60%, no WMAs, indet DDx  - swelling much improved, takes lasix 40mg  once daily.    3. Chest pain - several year history of chest pain - occurs with exertion only. Tightness mid to left chest, 7/10. No other associated symptoms. Comes on 4 laps around track, can keep going 6-9 laps. Then pain resolves. Overall stable in severity and frequency.   - 12/2015 lexiscan showed small area of mild ischemia inferior wall, LVEF 50%. Low risk study.  - 02/2016 echo LVEF 55-60%, no WMAs, grade I diastolic dysfunction CAD risk factors: DM2, hyperlipidemia, HTN, former tobacco, father MI 55s   - no recent chest pains.    4. CKD III -followed at [facility]. - Cr stable around 1.5  5. Hyperlipidemia  02/2020 TC 135 TG 93 HDL 39 LDL LDL 78 - compliant with statin  6. DM2 - followed by endocrinology Dr Dorris Fetch   7.PAD - history of tobacco abuse - no evidence of aneurysm by 05/2016 [place], did show some PAD. He denies claudication. - has been on ASA, statin  8. ALS - followed by neuro at [facility]       SH: has had covid vaccine moderna x 3.  Past Medical History:  Diagnosis Date  . Arthritis   . Diabetes mellitus, type II ([place])   . Fatty liver   . Hyperlipidemia   . Hypertension   . Kidney disease   . Progressive muscular atrophy ([place]) 04/27/2018     No Known Allergies   Current Outpatient Medications  Medication Sig Dispense Refill  . Accu-Chek Softclix Lancets lancets Use as instructed to test blood glucose  twice daily 200 each 3  . aspirin EC 81 MG tablet Take 81 mg by mouth daily.    Marland Kitchen atorvastatin (LIPITOR) 40 MG tablet TAKE 1 TABLET BY MOUTH EVERY DAY 90 tablet 3  . BD PEN NEEDLE NANO 2ND GEN 32G X 4 MM MISC USE AS DIRECTED EVERY MORNING 100 each 5  . Cholecalciferol (VITAMIN D3 PO) Take 4,000 mg by mouth 2 (two) times daily.    . furosemide (LASIX) 20 MG tablet Take 40 mg by mouth daily.     Marland Kitchen glucose blood (ACCU-CHEK GUIDE) test strip Use as instructed to test blood glucose twice daily 200 each 3  . hydrALAZINE (APRESOLINE) 50 MG tablet Take 1.5 tablets (75 mg total) by mouth 2 (two) times daily. 270 tablet 1  . isosorbide mononitrate (IMDUR) 30 MG 24 hr tablet TAKE 1 TABLET BY MOUTH EVERY DAY 90 tablet 1  . latanoprost (XALATAN) 0.005 % ophthalmic solution Place 1 drop into the left eye at bedtime.    Marland Kitchen lisinopril (ZESTRIL) 20 MG tablet TAKE 1 TABLET BY MOUTH EVERY DAY 90 tablet 0  . metoprolol succinate (TOPROL-XL) 50 MG 24 hr tablet TAKE 1 TABLET BY MOUTH 2 (TWO) TIMES DAILY. TAKE WITH OR IMMEDIATELY FOLLOWING A MEAL. 180 tablet 1  . Omega-3 Fatty Acids (FISH OIL) 1200 MG CAPS Take by mouth 2 (two) times daily.    Marland Kitchen  sodium bicarbonate 650 MG tablet Take 650 mg by mouth 3 (three) times daily.    [name] 100-3.6 UNIT-MG/ML SOPN INJECT 50 UNITS INTO THE SKIN DAILY WITH BREAKFAST 30 mL 1   No current facility-administered medications for this visit.     Past Surgical History:  Procedure Laterality Date  . COLONOSCOPY W/ POLYPECTOMY    . EYE SURGERY Bilateral    catarcts with lens  . MUSCLE BIOPSY Left 12/31/2017   Procedure: Left Deltoid Muscle Biopsy;  Surgeon: Ashok Pall, MD;  Location: [facility];  Service: Neurosurgery;  Laterality: Left;  Left Deltoid Muscle Biopsy  . OTHER SURGICAL HISTORY     growths removed R buttock1970, RU arm 2001, and R wrist 2009.  Marland Kitchen TOTAL HIP ARTHROPLASTY Right 12/14/2018   Procedure: TOTAL HIP ARTHROPLASTY ANTERIOR APPROACH;  Surgeon: Gaynelle Arabian, MD;   Location[facility];  Service: Orthopedics;  Laterality: Right;  181min     No Known Allergies    Family History  Problem Relation Age of Onset  . Diabetes Other   . Heart disease Other   . Hyperlipidemia Other   . Hypertension Other   . Lung cancer Mother   . Hypertension Mother   . Diabetes Mother   . High Cholesterol Mother   . Heart disease Father   . Hypertension Father   . Diabetes Father   . High Cholesterol Father      Social History John Bishop reports that he quit smoking about 33 years ago. He has never used smokeless tobacco. John Bishop reports no history of alcohol use.   Review of Systems CONSTITUTIONAL: No weight loss, fever, chills, weakness or fatigue.  HEENT: Eyes: No visual loss, blurred vision, double vision or yellow sclerae.No hearing loss, sneezing, congestion, runny nose or sore throat.  SKIN: No rash or itching.  CARDIOVASCULAR: per hpi RESPIRATORY: No shortness of breath, cough or sputum.  GASTROINTESTINAL: No anorexia, nausea, vomiting or diarrhea. No abdominal pain or blood.  GENITOURINARY: No burning on urination, no polyuria NEUROLOGICAL: No headache, dizziness, syncope, paralysis, ataxia, numbness or tingling in the extremities. No change in bowel or bladder control.  MUSCULOSKELETAL: No muscle, back pain, joint pain or stiffness.  LYMPHATICS: No enlarged nodes. No history of splenectomy.  PSYCHIATRIC: No history of depression or anxiety.  ENDOCRINOLOGIC: No reports of sweating, cold or heat intolerance. No polyuria or polydipsia.  Marland Kitchen   Physical Examination Today's Vitals   04/18/20 0924  BP: 130/64  Pulse: 78  SpO2: 98%  Weight: 202 lb (91.6 kg)  Height: 5\' 10"  (1.778 m)   Body mass index is 28.98 kg/m.  Gen: resting comfortably, no acute distress HEENT: no scleral icterus, pupils equal round and reactive, no palptable cervical adenopathy,  CV: RRR, no m/rg, no jvd Resp: Clear to auscultation bilaterally GI: abdomen is soft,  non-tender, non-distended, normal bowel sounds, no hepatosplenomegaly MSK: extremities are warm, no edema.  Skin: warm, no rash Neuro:  no focal deficits Psych: appropriate affect   Diagnostic Studies 12/2019 echo IMPRESSIONS    1. Left ventricular ejection fraction, by estimation, is 55 to 60%. The  left ventricle has normal function. The left ventricle has no regional  wall motion abnormalities. Left ventricular diastolic parameters are  indeterminate.  2. Right ventricular systolic function is normal. The right ventricular  size is normal. There is normal pulmonary artery systolic pressure. The  estimated right ventricular systolic pressure is 67.2 mmHg.  3. The mitral valve is grossly normal. Mild mitral valve  regurgitation.  4. The aortic valve is tricuspid, mild annular calcification. Aortic  valve regurgitation is not visualized.  5. The inferior vena cava is normal in size with greater than 50%  respiratory variability, suggesting right atrial pressure of 3 mmHg.     Assessment and Plan  1. HTN - at goal, continue current meds.    2. LE edema - echo without significant change - improved off norvasc with higher lasix dosing - overall doing well, continue current therapy.   3. Hyperlipidemia - at goal, continue current meds    F/u 6 months      Arnoldo Lenis, M.D.

## 2020-05-07 DIAGNOSIS — I129 Hypertensive chronic kidney disease with stage 1 through stage 4 chronic kidney disease, or unspecified chronic kidney disease: Secondary | ICD-10-CM | POA: Diagnosis not present

## 2020-05-07 DIAGNOSIS — N183 Chronic kidney disease, stage 3 unspecified: Secondary | ICD-10-CM | POA: Diagnosis not present

## 2020-05-07 DIAGNOSIS — E1122 Type 2 diabetes mellitus with diabetic chronic kidney disease: Secondary | ICD-10-CM | POA: Diagnosis not present

## 2020-05-13 ENCOUNTER — Other Ambulatory Visit: Payer: Self-pay | Admitting: Cardiology

## 2020-06-11 ENCOUNTER — Other Ambulatory Visit: Payer: Self-pay | Admitting: "Endocrinology

## 2020-06-26 ENCOUNTER — Other Ambulatory Visit: Payer: Self-pay | Admitting: Cardiology

## 2020-06-26 ENCOUNTER — Other Ambulatory Visit: Payer: Self-pay | Admitting: "Endocrinology

## 2020-07-04 ENCOUNTER — Ambulatory Visit: Payer: Medicare Other | Admitting: Nurse Practitioner

## 2020-07-06 DIAGNOSIS — I25721 Atherosclerosis of autologous artery coronary artery bypass graft(s) with angina pectoris with documented spasm: Secondary | ICD-10-CM | POA: Diagnosis not present

## 2020-07-06 DIAGNOSIS — E1122 Type 2 diabetes mellitus with diabetic chronic kidney disease: Secondary | ICD-10-CM | POA: Diagnosis not present

## 2020-07-06 DIAGNOSIS — Z87891 Personal history of nicotine dependence: Secondary | ICD-10-CM | POA: Diagnosis not present

## 2020-07-06 DIAGNOSIS — I129 Hypertensive chronic kidney disease with stage 1 through stage 4 chronic kidney disease, or unspecified chronic kidney disease: Secondary | ICD-10-CM | POA: Diagnosis not present

## 2020-07-06 DIAGNOSIS — N183 Chronic kidney disease, stage 3 unspecified: Secondary | ICD-10-CM | POA: Diagnosis not present

## 2020-07-17 DIAGNOSIS — Z7409 Other reduced mobility: Secondary | ICD-10-CM | POA: Diagnosis not present

## 2020-07-17 DIAGNOSIS — G1221 Amyotrophic lateral sclerosis: Secondary | ICD-10-CM | POA: Diagnosis not present

## 2020-07-17 DIAGNOSIS — R531 Weakness: Secondary | ICD-10-CM | POA: Diagnosis not present

## 2020-07-17 DIAGNOSIS — R292 Abnormal reflex: Secondary | ICD-10-CM | POA: Diagnosis not present

## 2020-07-17 DIAGNOSIS — G1220 Motor neuron disease, unspecified: Secondary | ICD-10-CM | POA: Diagnosis not present

## 2020-07-17 DIAGNOSIS — R29898 Other symptoms and signs involving the musculoskeletal system: Secondary | ICD-10-CM | POA: Diagnosis not present

## 2020-08-03 ENCOUNTER — Other Ambulatory Visit: Payer: Self-pay | Admitting: "Endocrinology

## 2020-08-03 ENCOUNTER — Other Ambulatory Visit: Payer: Self-pay | Admitting: Cardiology

## 2020-08-05 DIAGNOSIS — E782 Mixed hyperlipidemia: Secondary | ICD-10-CM | POA: Diagnosis not present

## 2020-08-05 DIAGNOSIS — I10 Essential (primary) hypertension: Secondary | ICD-10-CM | POA: Diagnosis not present

## 2020-08-05 DIAGNOSIS — E1165 Type 2 diabetes mellitus with hyperglycemia: Secondary | ICD-10-CM | POA: Diagnosis not present

## 2020-08-05 DIAGNOSIS — E1122 Type 2 diabetes mellitus with diabetic chronic kidney disease: Secondary | ICD-10-CM | POA: Diagnosis not present

## 2020-08-05 DIAGNOSIS — N183 Chronic kidney disease, stage 3 unspecified: Secondary | ICD-10-CM | POA: Diagnosis not present

## 2020-08-20 DIAGNOSIS — E782 Mixed hyperlipidemia: Secondary | ICD-10-CM | POA: Diagnosis not present

## 2020-08-20 DIAGNOSIS — I10 Essential (primary) hypertension: Secondary | ICD-10-CM | POA: Diagnosis not present

## 2020-08-20 DIAGNOSIS — N183 Chronic kidney disease, stage 3 unspecified: Secondary | ICD-10-CM | POA: Diagnosis not present

## 2020-08-20 DIAGNOSIS — E7849 Other hyperlipidemia: Secondary | ICD-10-CM | POA: Diagnosis not present

## 2020-08-20 DIAGNOSIS — K769 Liver disease, unspecified: Secondary | ICD-10-CM | POA: Diagnosis not present

## 2020-08-20 DIAGNOSIS — E1165 Type 2 diabetes mellitus with hyperglycemia: Secondary | ICD-10-CM | POA: Diagnosis not present

## 2020-08-20 DIAGNOSIS — E1122 Type 2 diabetes mellitus with diabetic chronic kidney disease: Secondary | ICD-10-CM | POA: Diagnosis not present

## 2020-08-20 DIAGNOSIS — Z1329 Encounter for screening for other suspected endocrine disorder: Secondary | ICD-10-CM | POA: Diagnosis not present

## 2020-08-23 DIAGNOSIS — M7989 Other specified soft tissue disorders: Secondary | ICD-10-CM | POA: Diagnosis not present

## 2020-08-23 DIAGNOSIS — E1122 Type 2 diabetes mellitus with diabetic chronic kidney disease: Secondary | ICD-10-CM | POA: Diagnosis not present

## 2020-08-23 DIAGNOSIS — I10 Essential (primary) hypertension: Secondary | ICD-10-CM | POA: Diagnosis not present

## 2020-08-23 DIAGNOSIS — L989 Disorder of the skin and subcutaneous tissue, unspecified: Secondary | ICD-10-CM | POA: Diagnosis not present

## 2020-08-23 DIAGNOSIS — S90222A Contusion of left lesser toe(s) with damage to nail, initial encounter: Secondary | ICD-10-CM | POA: Diagnosis not present

## 2020-08-23 DIAGNOSIS — E7849 Other hyperlipidemia: Secondary | ICD-10-CM | POA: Diagnosis not present

## 2020-08-23 DIAGNOSIS — E1165 Type 2 diabetes mellitus with hyperglycemia: Secondary | ICD-10-CM | POA: Diagnosis not present

## 2020-08-23 DIAGNOSIS — G1221 Amyotrophic lateral sclerosis: Secondary | ICD-10-CM | POA: Diagnosis not present

## 2020-09-05 ENCOUNTER — Other Ambulatory Visit: Payer: Self-pay | Admitting: "Endocrinology

## 2020-09-09 DIAGNOSIS — R600 Localized edema: Secondary | ICD-10-CM | POA: Diagnosis not present

## 2020-09-09 DIAGNOSIS — R609 Edema, unspecified: Secondary | ICD-10-CM | POA: Diagnosis not present

## 2020-09-12 DIAGNOSIS — D485 Neoplasm of uncertain behavior of skin: Secondary | ICD-10-CM | POA: Diagnosis not present

## 2020-09-12 DIAGNOSIS — D2339 Other benign neoplasm of skin of other parts of face: Secondary | ICD-10-CM | POA: Diagnosis not present

## 2020-09-18 DIAGNOSIS — Z23 Encounter for immunization: Secondary | ICD-10-CM | POA: Diagnosis not present

## 2020-09-19 DIAGNOSIS — N186 End stage renal disease: Secondary | ICD-10-CM | POA: Diagnosis not present

## 2020-09-19 DIAGNOSIS — N183 Chronic kidney disease, stage 3 unspecified: Secondary | ICD-10-CM | POA: Diagnosis not present

## 2020-09-19 DIAGNOSIS — E1122 Type 2 diabetes mellitus with diabetic chronic kidney disease: Secondary | ICD-10-CM | POA: Diagnosis not present

## 2020-09-23 DIAGNOSIS — E119 Type 2 diabetes mellitus without complications: Secondary | ICD-10-CM | POA: Diagnosis not present

## 2020-09-23 DIAGNOSIS — H33022 Retinal detachment with multiple breaks, left eye: Secondary | ICD-10-CM | POA: Diagnosis not present

## 2020-09-23 DIAGNOSIS — H35372 Puckering of macula, left eye: Secondary | ICD-10-CM | POA: Diagnosis not present

## 2020-09-27 ENCOUNTER — Ambulatory Visit: Payer: Medicare Other | Admitting: Nurse Practitioner

## 2020-09-30 ENCOUNTER — Other Ambulatory Visit: Payer: Self-pay

## 2020-09-30 ENCOUNTER — Encounter: Payer: Self-pay | Admitting: Nurse Practitioner

## 2020-09-30 ENCOUNTER — Ambulatory Visit (INDEPENDENT_AMBULATORY_CARE_PROVIDER_SITE_OTHER): Payer: Medicare Other | Admitting: Nurse Practitioner

## 2020-09-30 VITALS — BP 151/82 | HR 73 | Ht 70.0 in | Wt 202.0 lb

## 2020-09-30 DIAGNOSIS — I10 Essential (primary) hypertension: Secondary | ICD-10-CM | POA: Diagnosis not present

## 2020-09-30 DIAGNOSIS — E1122 Type 2 diabetes mellitus with diabetic chronic kidney disease: Secondary | ICD-10-CM | POA: Diagnosis not present

## 2020-09-30 DIAGNOSIS — E559 Vitamin D deficiency, unspecified: Secondary | ICD-10-CM | POA: Diagnosis not present

## 2020-09-30 DIAGNOSIS — E782 Mixed hyperlipidemia: Secondary | ICD-10-CM | POA: Diagnosis not present

## 2020-09-30 DIAGNOSIS — N183 Chronic kidney disease, stage 3 unspecified: Secondary | ICD-10-CM

## 2020-09-30 LAB — POCT GLYCOSYLATED HEMOGLOBIN (HGB A1C): HbA1c, POC (controlled diabetic range): 6 % (ref 0.0–7.0)

## 2020-09-30 MED ORDER — XULTOPHY 100-3.6 UNIT-MG/ML ~~LOC~~ SOPN: 40.0000 [IU] | PEN_INJECTOR | Freq: Every morning | SUBCUTANEOUS | 1 refills | Status: DC

## 2020-09-30 NOTE — Progress Notes (Signed)
09/30/2020  Endocrinology follow-up note   Subjective:    Patient ID: John Bishop, male    DOB: [birth year]. Patient is here for follow up in the management of type 2 diabetes complicated by stage 3-4 renal insufficiency, and hyperlipidemia.     Past Medical History:  Diagnosis Date  . Arthritis   . Diabetes mellitus, type II ([place])   . Fatty liver   . Hyperlipidemia   . Hypertension   . Kidney disease   . Progressive muscular atrophy ([place]) 04/27/2018   Past Surgical History:  Procedure Laterality Date  . COLONOSCOPY W/ POLYPECTOMY    . EYE SURGERY Bilateral    catarcts with lens  . MUSCLE BIOPSY Left 12/31/2017   Procedure: Left Deltoid Muscle Biopsy;  Surgeon: Ashok Pall, MD;  Location: [place];  Service: Neurosurgery;  Laterality: Left;  Left Deltoid Muscle Biopsy  . OTHER SURGICAL HISTORY     growths removed R buttock1970, RU arm 2001, and R wrist 2009.  Marland Kitchen TOTAL HIP ARTHROPLASTY Right 12/14/2018   Procedure: TOTAL HIP ARTHROPLASTY ANTERIOR APPROACH;  Surgeon: Gaynelle Arabian, MD;  Location: [facility];  Service: Orthopedics;  Laterality: Right;  133min   Social History   Socioeconomic History  . Marital status: Married    Spouse name: Not on file  . Number of children: Not on file  . Years of education: Not on file  . Highest education level: Not on file  Occupational History  . Not on file  Tobacco Use  . Smoking status: Former Smoker    Quit date: 06/07/1986    Years since quitting: 34.3  . Smokeless tobacco: Never Used  Vaping Use  . Vaping Use: Never used  Substance and Sexual Activity  . Alcohol use: No    Alcohol/week: 0.0 standard drinks    Comment: quit 1974  . Drug use: No  . Sexual activity: Not on file  Other Topics Concern  . Not on file  Social History Narrative   Lives home with wife, [name].  Education college.  One child in [place], [place].  Retired- Agricultural engineer.   Social Determinants of Health   Financial Resource Strain: Not on file   Food Insecurity: Not on file  Transportation Needs: Not on file  Physical Activity: Not on file  Stress: Not on file  Social Connections: Not on file   Outpatient Encounter Medications as of 09/30/2020  Medication Sig  . Accu-Chek Softclix Lancets lancets Use as instructed to test blood glucose twice daily  . aspirin EC 81 MG tablet Take 81 mg by mouth daily.  Marland Kitchen atorvastatin (LIPITOR) 40 MG tablet TAKE 1 TABLET BY MOUTH EVERY DAY  . BD PEN NEEDLE NANO 2ND GEN 32G X 4 MM MISC USE AS DIRECTED EVERY MORNING  . Cholecalciferol (VITAMIN D3 PO) Take 4,000 mg by mouth 2 (two) times daily.  . furosemide (LASIX) 20 MG tablet Take 40 mg by mouth daily.   Marland Kitchen glucose blood (ACCU-CHEK GUIDE) test strip Use as instructed to test blood glucose twice daily  . hydrALAZINE (APRESOLINE) 25 MG tablet Take 1 tablet (25 mg total) by mouth in the morning and at bedtime.  . hydrALAZINE (APRESOLINE) 50 MG tablet Take 1 tablet (50 mg total) by mouth 2 (two) times daily.  . isosorbide mononitrate (IMDUR) 30 MG 24 hr tablet TAKE 1 TABLET BY MOUTH EVERY DAY  . latanoprost (XALATAN) 0.005 % ophthalmic solution Place 1 drop into the left eye at bedtime.  Marland Kitchen lisinopril (ZESTRIL) 20  MG tablet TAKE 1 TABLET BY MOUTH EVERY DAY  . metoprolol succinate (TOPROL-XL) 50 MG 24 hr tablet TAKE 1 TABLET BY MOUTH 2 (TWO) TIMES DAILY. TAKE WITH OR IMMEDIATELY FOLLOWING A MEAL.  Marland Kitchen Omega-3 Fatty Acids (FISH OIL) 1200 MG CAPS Take by mouth 2 (two) times daily.  . sodium bicarbonate 650 MG tablet Take 650 mg by mouth 3 (three) times daily.  . [DISCONTINUED] XULTOPHY 100-3.6 UNIT-MG/ML SOPN INJECT 50 UNITS INTO THE SKIN DAILY WITH BREAKFAST  . Insulin Degludec-Liraglutide (XULTOPHY) 100-3.6 UNIT-MG/ML SOPN Inject 40 Units into the skin every morning.   No facility-administered encounter medications on file as of 09/30/2020.   ALLERGIES: No Known Allergies VACCINATION STATUS:  There is no immunization history on file for this  patient.  Diabetes He presents for his follow-up diabetic visit. He has type 2 diabetes mellitus. Onset time: He was diagnosed at approximate age of 24 years. His disease course has been stable. Hypoglycemia symptoms include sweats and tremors. Pertinent negatives for hypoglycemia include no confusion, pallor or seizures. Pertinent negatives for diabetes include no fatigue, no polydipsia, no polyphagia, no polyuria and no weakness. There are no hypoglycemic complications. Symptoms are stable. Diabetic complications include nephropathy. Pertinent negatives for diabetic complications include no heart disease. Risk factors for coronary artery disease include diabetes mellitus, dyslipidemia, male sex, tobacco exposure and hypertension. Current diabetic treatment includes insulin injections. He is compliant with treatment all of the time. His weight is fluctuating minimally. He is following a generally healthy diet. Meal planning includes avoidance of concentrated sweets. He has not had a previous visit with a dietitian. He participates in exercise intermittently. His home blood glucose trend is fluctuating minimally. His breakfast blood glucose range is generally 70-90 mg/dl. His overall blood glucose range is 70-90 mg/dl. (He presents today, accompanied by his wife, with his meter and logs showing tight fasting glycemic profile.  His POCT A1c today is 6%, decreasing from previous visit of 6.6%.  He does have some mild fasting hypoglycemia at times.) An ACE inhibitor/angiotensin II receptor blocker is being taken. He does not see a podiatrist.Eye exam is current.  Hyperlipidemia This is a chronic problem. The current episode started more than 1 year ago. The problem is controlled. Recent lipid tests were reviewed and are normal. Exacerbating diseases include diabetes. There are no known factors aggravating his hyperlipidemia. Pertinent negatives include no myalgias. Current antihyperlipidemic treatment includes  statins. The current treatment provides moderate improvement of lipids. There are no compliance problems.  Risk factors for coronary artery disease include dyslipidemia, diabetes mellitus, hypertension and male sex.    Review of systems  Constitutional: + Minimally fluctuating body weight,  current Body mass index is 28.98 kg/m. , no fatigue, no subjective hyperthermia, no subjective hypothermia Eyes: no blurry vision, no xerophthalmia ENT: no sore throat, no nodules palpated in throat, no dysphagia/odynophagia, no hoarseness Cardiovascular: no chest pain, no shortness of breath, no palpitations, no leg swelling Respiratory: no cough, no shortness of breath Gastrointestinal: no nausea/vomiting/diarrhea Musculoskeletal: no muscle/joint aches Skin: no rashes, no hyperemia Neurological: no tremors, no numbness, no tingling, no dizziness Psychiatric: no depression, no anxiety   Objective:    BP (!) 151/82 (BP Location: Left Arm)   Pulse 73   Ht 5\' 10"  (1.778 m)   Wt 202 lb (91.6 kg)   BMI 28.98 kg/m   Wt Readings from Last 3 Encounters:  09/30/20 202 lb (91.6 kg)  04/18/20 202 lb (91.6 kg)  03/29/20 203 lb 6.4 oz (  92.3 kg)    BP Readings from Last 3 Encounters:  09/30/20 (!) 151/82  04/18/20 130/64  03/29/20 128/76    Physical Exam- Limited  Constitutional:  Body mass index is 28.98 kg/m. , not in acute distress, normal state of mind Eyes:  EOMI, no exophthalmos Neck: Supple Cardiovascular: RRR, no murmers, rubs, or gallops, no edema Respiratory: Adequate breathing efforts, no crackles, rales, rhonchi, or wheezing Musculoskeletal: no gross deformities, strength intact in all four extremities, no gross restriction of joint movements Skin:  no rashes, no hyperemia Neurological: no tremor with outstretched hands    POCT ABI Results 09/30/20   Right ABI:  1.17      Left ABI:  1.22  Right leg systolic / diastolic: 010/27 mmHg Left leg systolic / diastolic: 253/66  mmHg  Arm systolic / diastolic: 440/34 mmHG  Detailed report will be scanned into patient chart.    CMP     Component Value Date/Time   NA 131 (L) 12/15/2018 0250   K 5.0 12/15/2018 0250   CL 103 12/15/2018 0250   CO2 20 (L) 12/15/2018 0250   GLUCOSE 313 (H) 12/15/2018 0250   BUN 22 (A) 02/16/2020 0000   CREATININE 1.5 (A) 02/16/2020 0000   CREATININE 1.45 (H) 12/15/2018 0250   CALCIUM 9.4 02/16/2020 0000   PROT 7.4 12/07/2018 1457   ALBUMIN 4.0 12/07/2018 1457   AST 24 12/07/2018 1457   ALT 27 12/07/2018 1457   ALKPHOS 86 12/07/2018 1457   BILITOT 0.8 12/07/2018 1457   GFRNONAA 45 02/16/2020 0000   GFRAA 53 02/16/2020 0000   Lipid Panel     Component Value Date/Time   CHOL 135 02/16/2020 0000   TRIG 93 02/16/2020 0000   HDL 39 02/16/2020 0000   LDLCALC 78 02/16/2020 0000   Diabetic Labs (most recent): Lab Results  Component Value Date   HGBA1C 6.0 09/30/2020   HGBA1C 6.6 02/16/2020   HGBA1C 6.5 (A) 09/28/2019    Completed labs from 07/24/2015 to be scanned into his records.  Assessment & Plan:   1) Diabetes mellitus with stage 3 chronic kidney disease ([facility])  - Patient has currently controlled asymptomatic type 2 DM since  80 years of age.  He presents today, accompanied by his wife, with his meter and logs showing tight fasting glycemic profile.  His POCT A1c today is 6%, decreasing from previous visit of 6.6%.  He does have some mild fasting hypoglycemia at times.   -His diabetes is complicated by stage 3 CKD ( which is stable GFR between 43-54).  - He remains at a high risk for more acute and chronic complications of diabetes which include CAD, CVA, CKD, retinopathy, and neuropathy. These are all discussed in detail with the patient.  - Nutritional counseling repeated at each appointment due to patients tendency to fall back in to old habits.  - The patient admits there is a room for improvement in their diet and drink choices. -  Suggestion is made for  the patient to avoid simple carbohydrates from their diet including Cakes, Sweet Desserts / Pastries, Ice Cream, Soda (diet and regular), Sweet Tea, Candies, Chips, Cookies, Sweet Pastries,  Store Bought Juices, Alcohol in Excess of  1-2 drinks a day, Artificial Sweeteners, Coffee Creamer, and "Sugar-free" Products. This will help patient to have stable blood glucose profile and potentially avoid unintended weight gain.   - I encouraged the patient to switch to  unprocessed or minimally processed complex starch and increased protein intake (animal  or plant source), fruits, and vegetables.   - Patient is advised to stick to a routine mealtimes to eat 3 meals  a day and avoid unnecessary snacks ( to snack only to correct hypoglycemia).  - I have approached patient with the following individualized plan to manage diabetes and patient agrees:   - Given his continued control of his diabetes to target and tight fasting glucose readings, he is advised to lower his dose Xultophy 100/3.6 to 40 units SQ daily with breakfast. This product will help for both diabetes and weight control.  -He is encouraged to continue monitoring blood glucose at least twice daily, before breakfast and before bed, and call the clinic if he has readings less than 70 or greater than 200 for 3 tests in a row.    -Patient is not a candidate for metformin, SGLT2 inhibitors due to CKD- renal function is proving with a GFR of 47.    2) Lipids/HPL: His most recent lipid panel from 02/16/20 shows controlled LDL of 78.  He is advised to continue Atorvastatin 40 mg po daily at bedtime.  Side effects and precautions discussed with him.  3) Hypertension: His blood pressure is controlled to target.  He is advised to continue Lasix 40 mg po daily, Hydralazine 75 mg po twice daily, Imdur 30 mg po daily, Lisinopril 20 mg po daily, and Metoprolol 50 mg po daily.  4) Weight management: His Body mass index is 28.98 kg/m.--- not a candidate for  major weight loss. Exercise and carbs management discussed in detail with him.  - I advised patient to maintain close follow up with his PCP for primary care needs.     I spent 40 minutes in the care of the patient today including review of labs from [facility], Lipids, Thyroid Function, Hematology (current and previous including abstractions from other facilities); face-to-face time discussing  his blood glucose readings/logs, discussing hypoglycemia and hyperglycemia episodes and symptoms, medications doses, his options of short and long term treatment based on the latest standards of care / guidelines;  discussion about incorporating lifestyle medicine;  and documenting the encounter.    Please refer to Patient Instructions for Blood Glucose Monitoring and Insulin/Medications Dosing Guide"  in media tab for additional information. Please  also refer to " Patient Self Inventory" in the Media  tab for reviewed elements of pertinent patient history.  [name] participated in the discussions, expressed understanding, and voiced agreement with the above plans.  All questions were answered to his satisfaction. he is encouraged to contact clinic should he have any questions or concerns prior to his return visit.    Follow up plan: - Return in about 6 months (around 04/01/2021) for Diabetes follow up- A1c and urine micro in office, Previsit labs, Bring glucometer and logs.  Rayetta Pigg, [facility] [address] [place], [place] [address] Phone: [phone] Fax: [phone]  09/30/2020, 10:14 AM

## 2020-10-02 DIAGNOSIS — E1165 Type 2 diabetes mellitus with hyperglycemia: Secondary | ICD-10-CM | POA: Diagnosis not present

## 2020-10-02 DIAGNOSIS — H40011 Open angle with borderline findings, low risk, right eye: Secondary | ICD-10-CM | POA: Diagnosis not present

## 2020-10-02 DIAGNOSIS — H401121 Primary open-angle glaucoma, left eye, mild stage: Secondary | ICD-10-CM | POA: Diagnosis not present

## 2020-10-02 DIAGNOSIS — H33322 Round hole, left eye: Secondary | ICD-10-CM | POA: Diagnosis not present

## 2020-10-02 DIAGNOSIS — H33002 Unspecified retinal detachment with retinal break, left eye: Secondary | ICD-10-CM | POA: Diagnosis not present

## 2020-10-05 DIAGNOSIS — I129 Hypertensive chronic kidney disease with stage 1 through stage 4 chronic kidney disease, or unspecified chronic kidney disease: Secondary | ICD-10-CM | POA: Diagnosis not present

## 2020-10-05 DIAGNOSIS — N183 Chronic kidney disease, stage 3 unspecified: Secondary | ICD-10-CM | POA: Diagnosis not present

## 2020-10-05 DIAGNOSIS — E1122 Type 2 diabetes mellitus with diabetic chronic kidney disease: Secondary | ICD-10-CM | POA: Diagnosis not present

## 2020-10-05 DIAGNOSIS — I25721 Atherosclerosis of autologous artery coronary artery bypass graft(s) with angina pectoris with documented spasm: Secondary | ICD-10-CM | POA: Diagnosis not present

## 2020-10-05 DIAGNOSIS — Z87891 Personal history of nicotine dependence: Secondary | ICD-10-CM | POA: Diagnosis not present

## 2020-10-27 ENCOUNTER — Other Ambulatory Visit: Payer: Self-pay | Admitting: "Endocrinology

## 2020-10-28 ENCOUNTER — Encounter: Payer: Self-pay | Admitting: *Deleted

## 2020-10-28 ENCOUNTER — Ambulatory Visit (INDEPENDENT_AMBULATORY_CARE_PROVIDER_SITE_OTHER): Payer: Medicare Other | Admitting: Cardiology

## 2020-10-28 ENCOUNTER — Encounter: Payer: Self-pay | Admitting: Cardiology

## 2020-10-28 VITALS — BP 160/80 | HR 64 | Ht 70.0 in | Wt 201.0 lb

## 2020-10-28 DIAGNOSIS — E782 Mixed hyperlipidemia: Secondary | ICD-10-CM | POA: Diagnosis not present

## 2020-10-28 DIAGNOSIS — I10 Essential (primary) hypertension: Secondary | ICD-10-CM | POA: Diagnosis not present

## 2020-10-28 DIAGNOSIS — R600 Localized edema: Secondary | ICD-10-CM

## 2020-10-28 MED ORDER — FUROSEMIDE 20 MG PO TABS: 40.0000 mg | ORAL_TABLET | Freq: Every day | ORAL | 3 refills | Status: AC

## 2020-10-28 NOTE — Patient Instructions (Addendum)
Medication Instructions:   Change Lasix to 40mg  daily, may take an additional 20mg  as needed for swelling.  Continue all other medications.    Labwork: none  Testing/Procedures: none  Follow-Up: 6 months   Any Other Special Instructions Will Be Listed Below (If Applicable).  If you need a refill on your cardiac medications before your next appointment, please call your pharmacy.

## 2020-10-28 NOTE — Progress Notes (Signed)
Clinical Summary John Bishop is a 80 y.o.male seen today for follow up of the following medical problems.   1. HTN - pcp stopped norvasc due toleg swelling - home bp's 120s-130s/70-80s  2. LE edema - seen by nephrology, they increased his lasix to 40mg  daily. - also norvasc was recently stopped  - 12/2019 echo LVEF 55-60%, no WMAs, indet DDx  - some increased swelling since last visit   3. Chest pain - several year history of chest pain - occurs with exertion only. Tightness mid to left chest, 7/10. No other associated symptoms. Comes on 4 laps around track, can keep going 6-9 laps. Then pain resolves. Overall stable in severity and frequency.   - 12/2015 lexiscan showed small area of mild ischemia inferior wall, LVEF 50%. Low risk study.  - 02/2016 echo LVEF 55-60%, no WMAs, grade I diastolic dysfunction CAD risk factors: DM2, hyperlipidemia, HTN, former tobacco, father MI 53s   - denies any recent symptoms symptoms.    4. CKD III -followed at [facility].   5. Hyperlipidemia  02/2020 TC 135 TG 93 HDL 39 LDL LDL 78 - compliant with statin  - reports more recent labs 07/2020 with pcp  6. DM2 - followed by endocrinology Dr Dorris Fetch   7.PAD - history of tobacco abuse - no evidence of aneurysm by 05/2016 [place], did show some PAD. He denies claudication. - has been on ASA, statin  8. ALS - followed by neuro at [facility] - reports some progression in last year, primarily arm weakness R>L  Past Medical History:  Diagnosis Date  . Arthritis   . Diabetes mellitus, type II ([place] [place])   . Fatty liver   . Hyperlipidemia   . Hypertension   . Kidney disease   . Progressive muscular atrophy ([place]) 04/27/2018     No Known Allergies   Current Outpatient Medications  Medication Sig Dispense Refill  . Accu-Chek Softclix Lancets lancets Use as instructed to test blood glucose twice daily 200 each 3  . aspirin EC 81 MG tablet Take 81 mg by mouth daily.    Marland Kitchen  atorvastatin (LIPITOR) 40 MG tablet TAKE 1 TABLET BY MOUTH EVERY DAY 90 tablet 1  . BD PEN NEEDLE NANO 2ND GEN 32G X 4 MM MISC USE AS DIRECTED EVERY MORNING 100 each 5  . Cholecalciferol (VITAMIN D3 PO) Take 4,000 mg by mouth 2 (two) times daily.    . furosemide (LASIX) 20 MG tablet Take 40 mg by mouth daily.     Marland Kitchen glucose blood (ACCU-CHEK GUIDE) test strip Use as instructed to test blood glucose twice daily 200 each 3  . hydrALAZINE (APRESOLINE) 25 MG tablet Take 1 tablet (25 mg total) by mouth in the morning and at bedtime. 180 tablet 1  . hydrALAZINE (APRESOLINE) 50 MG tablet Take 1 tablet (50 mg total) by mouth 2 (two) times daily. 180 tablet 1  . Insulin Degludec-Liraglutide (XULTOPHY) 100-3.6 UNIT-MG/ML SOPN Inject 40 Units into the skin every morning. 30 mL 1  . isosorbide mononitrate (IMDUR) 30 MG 24 hr tablet TAKE 1 TABLET BY MOUTH EVERY DAY 90 tablet 1  . latanoprost (XALATAN) 0.005 % ophthalmic solution Place 1 drop into the left eye at bedtime.    Marland Kitchen lisinopril (ZESTRIL) 20 MG tablet TAKE 1 TABLET BY MOUTH EVERY DAY 90 tablet 0  . metoprolol succinate (TOPROL-XL) 50 MG 24 hr tablet TAKE 1 TABLET BY MOUTH 2 (TWO) TIMES DAILY. TAKE WITH OR IMMEDIATELY FOLLOWING A MEAL. [place]  tablet 3  . Omega-3 Fatty Acids (FISH OIL) 1200 MG CAPS Take by mouth 2 (two) times daily.    . sodium bicarbonate 650 MG tablet Take 650 mg by mouth 3 (three) times daily.     No current facility-administered medications for this visit.     Past Surgical History:  Procedure Laterality Date  . COLONOSCOPY W/ POLYPECTOMY    . EYE SURGERY Bilateral    catarcts with lens  . MUSCLE BIOPSY Left 12/31/2017   Procedure: Left Deltoid Muscle Biopsy;  Surgeon: Ashok Pall, MD;  Location: [place];  Service: Neurosurgery;  Laterality: Left;  Left Deltoid Muscle Biopsy  . OTHER SURGICAL HISTORY     growths removed R buttock1970, RU arm 2001, and R wrist 2009.  Marland Kitchen TOTAL HIP ARTHROPLASTY Right 12/14/2018   Procedure: TOTAL HIP  ARTHROPLASTY ANTERIOR APPROACH;  Surgeon: Gaynelle Arabian, MD;  Location[facility];  Service: Orthopedics;  Laterality: Right;  126min     No Known Allergies    Family History  Problem Relation Age of Onset  . Diabetes Other   . Heart disease Other   . Hyperlipidemia Other   . Hypertension Other   . Lung cancer Mother   . Hypertension Mother   . Diabetes Mother   . High Cholesterol Mother   . Heart disease Father   . Hypertension Father   . Diabetes Father   . High Cholesterol Father      Social History John Bishop reports that he quit smoking about 34 years ago. He has never used smokeless tobacco. John Bishop reports no history of alcohol use.   Review of Systems CONSTITUTIONAL: No weight loss, fever, chills, weakness or fatigue.  HEENT: Eyes: No visual loss, blurred vision, double vision or yellow sclerae.No hearing loss, sneezing, congestion, runny nose or sore throat.  SKIN: No rash or itching.  CARDIOVASCULAR: per hpi RESPIRATORY: No shortness of breath, cough or sputum.  GASTROINTESTINAL: No anorexia, nausea, vomiting or diarrhea. No abdominal pain or blood.  GENITOURINARY: No burning on urination, no polyuria NEUROLOGICAL: No headache, dizziness, syncope, paralysis, ataxia, numbness or tingling in the extremities. No change in bowel or bladder control.  MUSCULOSKELETAL: No muscle, back pain, joint pain or stiffness.  LYMPHATICS: No enlarged nodes. No history of splenectomy.  PSYCHIATRIC: No history of depression or anxiety.  ENDOCRINOLOGIC: No reports of sweating, cold or heat intolerance. No polyuria or polydipsia.  Marland Kitchen   Physical Examination Today's Vitals   10/28/20 0953  BP: (!) 160/80  Pulse: 64  SpO2: 96%  Weight: 201 lb (91.2 kg)  Height: 5\' 10"  (1.778 m)   Body mass index is 28.84 kg/m.  Gen: resting comfortably, no acute distress HEENT: no scleral icterus, pupils equal round and reactive, no palptable cervical adenopathy,  CV: RRR, no m/r/g, no  jvd Resp: Clear to auscultation bilaterally GI: abdomen is soft, non-tender, non-distended, normal bowel sounds, no hepatosplenomegaly MSK: extremities are warm, no edema.  Skin: warm, no rash Neuro:  no focal deficits Psych: appropriate affect   Diagnostic Studies  12/2019 echo IMPRESSIONS    1. Left ventricular ejection fraction, by estimation, is 55 to 60%. The  left ventricle has normal function. The left ventricle has no regional  wall motion abnormalities. Left ventricular diastolic parameters are  indeterminate.  2. Right ventricular systolic function is normal. The right ventricular  size is normal. There is normal pulmonary artery systolic pressure. The  estimated right ventricular systolic pressure is 29.5 mmHg.  3. The mitral  valve is grossly normal. Mild mitral valve regurgitation.  4. The aortic valve is tricuspid, mild annular calcification. Aortic  valve regurgitation is not visualized.  5. The inferior vena cava is normal in size with greater than 50%  respiratory variability, suggesting right atrial pressure of 3 mmHg.    Assessment and Plan  1.HTN - elevated in clnic but detailed home bp' report is at goal -continue current meds   2. LE edema - some increased edema, change lasix to 40mg  may take additional 20mg  prn swelling.   3. Hyperlipidemia - continue statin, request pcp labs. Has been at goal  EKG today shows NSR  F/u 6 months   Arnoldo Lenis, M.D.

## 2020-11-04 DIAGNOSIS — Z87891 Personal history of nicotine dependence: Secondary | ICD-10-CM | POA: Diagnosis not present

## 2020-11-04 DIAGNOSIS — E1122 Type 2 diabetes mellitus with diabetic chronic kidney disease: Secondary | ICD-10-CM | POA: Diagnosis not present

## 2020-11-04 DIAGNOSIS — N183 Chronic kidney disease, stage 3 unspecified: Secondary | ICD-10-CM | POA: Diagnosis not present

## 2020-11-04 DIAGNOSIS — I25721 Atherosclerosis of autologous artery coronary artery bypass graft(s) with angina pectoris with documented spasm: Secondary | ICD-10-CM | POA: Diagnosis not present

## 2020-11-04 DIAGNOSIS — I129 Hypertensive chronic kidney disease with stage 1 through stage 4 chronic kidney disease, or unspecified chronic kidney disease: Secondary | ICD-10-CM | POA: Diagnosis not present

## 2020-11-10 ENCOUNTER — Other Ambulatory Visit: Payer: Self-pay | Admitting: Cardiology

## 2020-11-19 DIAGNOSIS — D631 Anemia in chronic kidney disease: Secondary | ICD-10-CM | POA: Diagnosis not present

## 2020-11-19 DIAGNOSIS — N183 Chronic kidney disease, stage 3 unspecified: Secondary | ICD-10-CM | POA: Diagnosis not present

## 2020-11-19 DIAGNOSIS — I129 Hypertensive chronic kidney disease with stage 1 through stage 4 chronic kidney disease, or unspecified chronic kidney disease: Secondary | ICD-10-CM | POA: Diagnosis not present

## 2020-11-19 DIAGNOSIS — R609 Edema, unspecified: Secondary | ICD-10-CM | POA: Diagnosis not present

## 2020-11-19 DIAGNOSIS — Z79899 Other long term (current) drug therapy: Secondary | ICD-10-CM | POA: Diagnosis not present

## 2020-11-19 DIAGNOSIS — E1122 Type 2 diabetes mellitus with diabetic chronic kidney disease: Secondary | ICD-10-CM | POA: Diagnosis not present

## 2020-11-19 DIAGNOSIS — G1221 Amyotrophic lateral sclerosis: Secondary | ICD-10-CM | POA: Diagnosis not present

## 2020-11-20 DIAGNOSIS — H40011 Open angle with borderline findings, low risk, right eye: Secondary | ICD-10-CM | POA: Diagnosis not present

## 2020-11-20 DIAGNOSIS — H401121 Primary open-angle glaucoma, left eye, mild stage: Secondary | ICD-10-CM | POA: Diagnosis not present

## 2020-11-22 DIAGNOSIS — Z6827 Body mass index (BMI) 27.0-27.9, adult: Secondary | ICD-10-CM | POA: Diagnosis not present

## 2020-11-22 DIAGNOSIS — M7989 Other specified soft tissue disorders: Secondary | ICD-10-CM | POA: Diagnosis not present

## 2020-11-22 DIAGNOSIS — G1221 Amyotrophic lateral sclerosis: Secondary | ICD-10-CM | POA: Diagnosis not present

## 2020-12-05 ENCOUNTER — Other Ambulatory Visit: Payer: Self-pay | Admitting: "Endocrinology

## 2020-12-05 DIAGNOSIS — E1122 Type 2 diabetes mellitus with diabetic chronic kidney disease: Secondary | ICD-10-CM | POA: Diagnosis not present

## 2020-12-05 DIAGNOSIS — Z87891 Personal history of nicotine dependence: Secondary | ICD-10-CM | POA: Diagnosis not present

## 2020-12-05 DIAGNOSIS — N183 Chronic kidney disease, stage 3 unspecified: Secondary | ICD-10-CM | POA: Diagnosis not present

## 2020-12-05 DIAGNOSIS — I129 Hypertensive chronic kidney disease with stage 1 through stage 4 chronic kidney disease, or unspecified chronic kidney disease: Secondary | ICD-10-CM | POA: Diagnosis not present

## 2020-12-05 DIAGNOSIS — I25721 Atherosclerosis of autologous artery coronary artery bypass graft(s) with angina pectoris with documented spasm: Secondary | ICD-10-CM | POA: Diagnosis not present

## 2020-12-14 ENCOUNTER — Other Ambulatory Visit: Payer: Self-pay | Admitting: Cardiology

## 2021-01-05 DIAGNOSIS — I129 Hypertensive chronic kidney disease with stage 1 through stage 4 chronic kidney disease, or unspecified chronic kidney disease: Secondary | ICD-10-CM | POA: Diagnosis not present

## 2021-01-05 DIAGNOSIS — I25721 Atherosclerosis of autologous artery coronary artery bypass graft(s) with angina pectoris with documented spasm: Secondary | ICD-10-CM | POA: Diagnosis not present

## 2021-01-05 DIAGNOSIS — Z87891 Personal history of nicotine dependence: Secondary | ICD-10-CM | POA: Diagnosis not present

## 2021-01-05 DIAGNOSIS — N183 Chronic kidney disease, stage 3 unspecified: Secondary | ICD-10-CM | POA: Diagnosis not present

## 2021-01-05 DIAGNOSIS — E1122 Type 2 diabetes mellitus with diabetic chronic kidney disease: Secondary | ICD-10-CM | POA: Diagnosis not present

## 2021-01-06 DIAGNOSIS — R6881 Early satiety: Secondary | ICD-10-CM | POA: Diagnosis not present

## 2021-01-06 DIAGNOSIS — R059 Cough, unspecified: Secondary | ICD-10-CM | POA: Diagnosis not present

## 2021-01-06 DIAGNOSIS — R140 Abdominal distension (gaseous): Secondary | ICD-10-CM | POA: Diagnosis not present

## 2021-01-06 DIAGNOSIS — D1803 Hemangioma of intra-abdominal structures: Secondary | ICD-10-CM | POA: Diagnosis not present

## 2021-01-06 DIAGNOSIS — Z6827 Body mass index (BMI) 27.0-27.9, adult: Secondary | ICD-10-CM | POA: Diagnosis not present

## 2021-01-06 DIAGNOSIS — G1221 Amyotrophic lateral sclerosis: Secondary | ICD-10-CM | POA: Diagnosis not present

## 2021-01-08 DIAGNOSIS — G1221 Amyotrophic lateral sclerosis: Secondary | ICD-10-CM | POA: Diagnosis not present

## 2021-01-08 DIAGNOSIS — Z736 Limitation of activities due to disability: Secondary | ICD-10-CM | POA: Diagnosis not present

## 2021-01-11 ENCOUNTER — Other Ambulatory Visit: Payer: Self-pay | Admitting: "Endocrinology

## 2021-01-14 DIAGNOSIS — K7689 Other specified diseases of liver: Secondary | ICD-10-CM | POA: Diagnosis not present

## 2021-01-14 DIAGNOSIS — G1221 Amyotrophic lateral sclerosis: Secondary | ICD-10-CM | POA: Diagnosis not present

## 2021-01-14 DIAGNOSIS — D1803 Hemangioma of intra-abdominal structures: Secondary | ICD-10-CM | POA: Diagnosis not present

## 2021-01-18 ENCOUNTER — Other Ambulatory Visit: Payer: Self-pay | Admitting: Cardiology

## 2021-01-20 DIAGNOSIS — N183 Chronic kidney disease, stage 3 unspecified: Secondary | ICD-10-CM | POA: Diagnosis not present

## 2021-01-29 ENCOUNTER — Encounter: Payer: Self-pay | Admitting: Internal Medicine

## 2021-02-05 DIAGNOSIS — I25721 Atherosclerosis of autologous artery coronary artery bypass graft(s) with angina pectoris with documented spasm: Secondary | ICD-10-CM | POA: Diagnosis not present

## 2021-02-05 DIAGNOSIS — E1122 Type 2 diabetes mellitus with diabetic chronic kidney disease: Secondary | ICD-10-CM | POA: Diagnosis not present

## 2021-02-05 DIAGNOSIS — I129 Hypertensive chronic kidney disease with stage 1 through stage 4 chronic kidney disease, or unspecified chronic kidney disease: Secondary | ICD-10-CM | POA: Diagnosis not present

## 2021-02-05 DIAGNOSIS — N183 Chronic kidney disease, stage 3 unspecified: Secondary | ICD-10-CM | POA: Diagnosis not present

## 2021-02-05 DIAGNOSIS — Z87891 Personal history of nicotine dependence: Secondary | ICD-10-CM | POA: Diagnosis not present

## 2021-02-20 DIAGNOSIS — E1165 Type 2 diabetes mellitus with hyperglycemia: Secondary | ICD-10-CM | POA: Diagnosis not present

## 2021-02-20 DIAGNOSIS — K769 Liver disease, unspecified: Secondary | ICD-10-CM | POA: Diagnosis not present

## 2021-02-20 DIAGNOSIS — E1122 Type 2 diabetes mellitus with diabetic chronic kidney disease: Secondary | ICD-10-CM | POA: Diagnosis not present

## 2021-02-20 DIAGNOSIS — E782 Mixed hyperlipidemia: Secondary | ICD-10-CM | POA: Diagnosis not present

## 2021-02-20 DIAGNOSIS — E7849 Other hyperlipidemia: Secondary | ICD-10-CM | POA: Diagnosis not present

## 2021-02-20 LAB — BASIC METABOLIC PANEL: Glucose: 75

## 2021-02-20 LAB — LIPID PANEL
LDL Cholesterol: 72
Triglycerides: 81 (ref 40–160)

## 2021-02-20 LAB — COMPREHENSIVE METABOLIC PANEL: GFR calc Af Amer: 40

## 2021-02-20 LAB — HEMOGLOBIN A1C: Hemoglobin A1C: 5.9

## 2021-02-25 DIAGNOSIS — I25721 Atherosclerosis of autologous artery coronary artery bypass graft(s) with angina pectoris with documented spasm: Secondary | ICD-10-CM | POA: Diagnosis not present

## 2021-02-25 DIAGNOSIS — D1803 Hemangioma of intra-abdominal structures: Secondary | ICD-10-CM | POA: Diagnosis not present

## 2021-02-25 DIAGNOSIS — I739 Peripheral vascular disease, unspecified: Secondary | ICD-10-CM | POA: Diagnosis not present

## 2021-02-25 DIAGNOSIS — R140 Abdominal distension (gaseous): Secondary | ICD-10-CM | POA: Diagnosis not present

## 2021-02-25 DIAGNOSIS — E782 Mixed hyperlipidemia: Secondary | ICD-10-CM | POA: Diagnosis not present

## 2021-02-25 DIAGNOSIS — M7989 Other specified soft tissue disorders: Secondary | ICD-10-CM | POA: Diagnosis not present

## 2021-02-25 DIAGNOSIS — Z6827 Body mass index (BMI) 27.0-27.9, adult: Secondary | ICD-10-CM | POA: Diagnosis not present

## 2021-02-25 DIAGNOSIS — R6881 Early satiety: Secondary | ICD-10-CM | POA: Diagnosis not present

## 2021-02-25 DIAGNOSIS — G1221 Amyotrophic lateral sclerosis: Secondary | ICD-10-CM | POA: Diagnosis not present

## 2021-02-25 DIAGNOSIS — E1122 Type 2 diabetes mellitus with diabetic chronic kidney disease: Secondary | ICD-10-CM | POA: Diagnosis not present

## 2021-02-25 DIAGNOSIS — N183 Chronic kidney disease, stage 3 unspecified: Secondary | ICD-10-CM | POA: Diagnosis not present

## 2021-02-25 DIAGNOSIS — I10 Essential (primary) hypertension: Secondary | ICD-10-CM | POA: Diagnosis not present

## 2021-03-07 DIAGNOSIS — I10 Essential (primary) hypertension: Secondary | ICD-10-CM | POA: Diagnosis not present

## 2021-03-07 DIAGNOSIS — E1165 Type 2 diabetes mellitus with hyperglycemia: Secondary | ICD-10-CM | POA: Diagnosis not present

## 2021-03-07 DIAGNOSIS — M170 Bilateral primary osteoarthritis of knee: Secondary | ICD-10-CM | POA: Diagnosis not present

## 2021-03-07 DIAGNOSIS — Z7984 Long term (current) use of oral hypoglycemic drugs: Secondary | ICD-10-CM | POA: Diagnosis not present

## 2021-03-08 DIAGNOSIS — Z23 Encounter for immunization: Secondary | ICD-10-CM | POA: Diagnosis not present

## 2021-03-16 ENCOUNTER — Other Ambulatory Visit: Payer: Self-pay | Admitting: "Endocrinology

## 2021-03-19 DIAGNOSIS — H40011 Open angle with borderline findings, low risk, right eye: Secondary | ICD-10-CM | POA: Diagnosis not present

## 2021-03-19 DIAGNOSIS — H401121 Primary open-angle glaucoma, left eye, mild stage: Secondary | ICD-10-CM | POA: Diagnosis not present

## 2021-03-24 DIAGNOSIS — Z23 Encounter for immunization: Secondary | ICD-10-CM | POA: Diagnosis not present

## 2021-03-25 DIAGNOSIS — E1122 Type 2 diabetes mellitus with diabetic chronic kidney disease: Secondary | ICD-10-CM | POA: Diagnosis not present

## 2021-03-25 DIAGNOSIS — N186 End stage renal disease: Secondary | ICD-10-CM | POA: Diagnosis not present

## 2021-03-31 NOTE — Patient Instructions (Signed)

## 2021-04-01 ENCOUNTER — Other Ambulatory Visit: Payer: Self-pay

## 2021-04-01 ENCOUNTER — Ambulatory Visit (INDEPENDENT_AMBULATORY_CARE_PROVIDER_SITE_OTHER): Payer: Medicare Other | Admitting: Nurse Practitioner

## 2021-04-01 ENCOUNTER — Encounter: Payer: Self-pay | Admitting: Nurse Practitioner

## 2021-04-01 VITALS — BP 145/78 | HR 69 | Ht 70.0 in | Wt 188.0 lb

## 2021-04-01 DIAGNOSIS — N183 Chronic kidney disease, stage 3 unspecified: Secondary | ICD-10-CM

## 2021-04-01 DIAGNOSIS — E782 Mixed hyperlipidemia: Secondary | ICD-10-CM

## 2021-04-01 DIAGNOSIS — E1122 Type 2 diabetes mellitus with diabetic chronic kidney disease: Secondary | ICD-10-CM

## 2021-04-01 DIAGNOSIS — I10 Essential (primary) hypertension: Secondary | ICD-10-CM

## 2021-04-01 DIAGNOSIS — E559 Vitamin D deficiency, unspecified: Secondary | ICD-10-CM | POA: Diagnosis not present

## 2021-04-01 MED ORDER — XULTOPHY 100-3.6 UNIT-MG/ML ~~LOC~~ SOPN: 60.0000 [IU] | PEN_INJECTOR | Freq: Every day | SUBCUTANEOUS | 1 refills | Status: DC

## 2021-04-01 MED ORDER — XULTOPHY 100-3.6 UNIT-MG/ML ~~LOC~~ SOPN: 20.0000 [IU] | PEN_INJECTOR | Freq: Every day | SUBCUTANEOUS | 1 refills | Status: DC

## 2021-04-01 NOTE — Progress Notes (Signed)
04/01/2021  Endocrinology follow-up note   Subjective:    Patient ID: John Bishop, male    DOB: [birth year]. Patient is here for follow up in the management of type 2 diabetes complicated by stage 3-4 renal insufficiency, and hyperlipidemia.     Past Medical History:  Diagnosis Date   Arthritis    Diabetes mellitus, type II ([place])    Fatty liver    Hyperlipidemia    Hypertension    Kidney disease    Progressive muscular atrophy ([place]) 04/27/2018   Past Surgical History:  Procedure Laterality Date   COLONOSCOPY W/ POLYPECTOMY     EYE SURGERY Bilateral    catarcts with lens   MUSCLE BIOPSY Left 12/31/2017   Procedure: Left Deltoid Muscle Biopsy;  Surgeon: Ashok Pall, MD;  Location: [facility];  Service: Neurosurgery;  Laterality: Left;  Left Deltoid Muscle Biopsy   OTHER SURGICAL HISTORY     growths removed R buttock1970, RU arm 2001, and R wrist 2009.   TOTAL HIP ARTHROPLASTY Right 12/14/2018   Procedure: TOTAL HIP ARTHROPLASTY ANTERIOR APPROACH;  Surgeon: Gaynelle Arabian, MD;  [facility];  Service: Orthopedics;  Laterality: Right;  18min   Social History   Socioeconomic History   Marital status: Married    Spouse name: Not on file   Number of children: Not on file   Years of education: Not on file   Highest education level: Not on file  Occupational History   Not on file  Tobacco Use   Smoking status: Former    Types: Cigarettes    Quit date: 06/07/1986    Years since quitting: 34.8   Smokeless tobacco: Never  Vaping Use   Vaping Use: Never used  Substance and Sexual Activity   Alcohol use: No    Alcohol/week: 0.0 standard drinks    Comment: quit 1974   Drug use: No   Sexual activity: Not on file  Other Topics Concern   Not on file  Social History Narrative   Lives home with wife, [name].  Education college.  One child in [place], [place].  Retired- Agricultural engineer.   Social Determinants of Health   Financial Resource Strain: Not on file  Food  Insecurity: Not on file  Transportation Needs: Not on file  Physical Activity: Not on file  Stress: Not on file  Social Connections: Not on file   Outpatient Encounter Medications as of 04/01/2021  Medication Sig   Accu-Chek Softclix Lancets lancets Use as instructed to test blood glucose twice daily   aspirin EC 81 MG tablet Take 81 mg by mouth daily.   atorvastatin (LIPITOR) 40 MG tablet TAKE 1 TABLET BY MOUTH EVERY DAY   BD PEN NEEDLE NANO 2ND GEN 32G X 4 MM MISC USE AS DIRECTED EVERY MORNING   Cholecalciferol (VITAMIN D3 PO) Take 4,000 mg by mouth 2 (two) times daily.   dorzolamide-timolol (COSOPT) 22.3-6.8 MG/ML ophthalmic solution Place 1 drop into the left eye 2 (two) times daily.   furosemide (LASIX) 20 MG tablet Take 2 tablets (40 mg total) by mouth daily. (MAY TAKE AN ADDITIONAL 20MG  TAB AS NEEDED FOR SWELLING)   glucose blood (ACCU-CHEK GUIDE) test strip Use as instructed to test blood glucose twice daily   hydrALAZINE (APRESOLINE) 25 MG tablet Take 1 tablet (25 mg total) by mouth in the morning and at bedtime.   hydrALAZINE (APRESOLINE) 50 MG tablet TAKE 1.5 TABLETS (75 MG TOTAL) BY MOUTH 2 (TWO) TIMES DAILY.   Insulin Degludec-Liraglutide (XULTOPHY)  100-3.6 UNIT-MG/ML SOPN Inject 20 Units into the skin daily with breakfast.   isosorbide mononitrate (IMDUR) 30 MG 24 hr tablet TAKE 1 TABLET BY MOUTH EVERY DAY   latanoprost (XALATAN) 0.005 % ophthalmic solution Place 1 drop into the left eye at bedtime.   lisinopril (ZESTRIL) 20 MG tablet TAKE 1 TABLET BY MOUTH EVERY DAY   metoprolol succinate (TOPROL-XL) 50 MG 24 hr tablet TAKE 1 TABLET BY MOUTH 2 (TWO) TIMES DAILY. TAKE WITH OR IMMEDIATELY FOLLOWING A MEAL.   Omega-3 Fatty Acids (FISH OIL) 1200 MG CAPS Take by mouth 2 (two) times daily.   sodium bicarbonate 650 MG tablet Take 650 mg by mouth 3 (three) times daily.   [DISCONTINUED] Insulin Degludec-Liraglutide (XULTOPHY) 100-3.6 UNIT-MG/ML SOPN Inject 60 Units into the skin daily  with breakfast.   [DISCONTINUED] XULTOPHY 100-3.6 UNIT-MG/ML SOPN INJECT 50 UNITS INTO THE SKIN DAILY WITH BREAKFAST (Patient taking differently: 40 Units every morning.)   No facility-administered encounter medications on file as of 04/01/2021.   ALLERGIES: No Known Allergies VACCINATION STATUS:  There is no immunization history on file for this patient.  Diabetes He presents for his follow-up diabetic visit. He has type 2 diabetes mellitus. Onset time: He was diagnosed at approximate age of 22 years. His disease course has been stable. Hypoglycemia symptoms include sweats and tremors. Pertinent negatives for hypoglycemia include no confusion, pallor or seizures. Pertinent negatives for diabetes include no fatigue, no polydipsia, no polyphagia, no polyuria and no weakness. Hypoglycemia complications include nocturnal hypoglycemia. Symptoms are stable. Diabetic complications include nephropathy. Pertinent negatives for diabetic complications include no heart disease. Risk factors for coronary artery disease include diabetes mellitus, dyslipidemia, male sex, tobacco exposure and hypertension. Current diabetic treatment includes insulin injections. He is compliant with treatment all of the time. His weight is decreasing rapidly. He is following a generally healthy diet. Meal planning includes avoidance of concentrated sweets. He has not had a previous visit with a dietitian. He participates in exercise intermittently. His home blood glucose trend is decreasing steadily. His breakfast blood glucose range is generally 70-90 mg/dl. His overall blood glucose range is 70-90 mg/dl. (He presents today, accompanied by his wife, with his meter and logs showing tight fasting glycemic profile.  His previsit A1c was 5.9% on 02/20/21 at his PCP office.  He has frequent nocturnal hypoglycemia.  His ALS has also advanced and he is unable to perform his ADLs, therefore the wife helps him with most tasks, including blood  glucose checks.  They are asking about a CGM today.) An ACE inhibitor/angiotensin II receptor blocker is being taken. He does not see a podiatrist.Eye exam is current.  Hyperlipidemia This is a chronic problem. The current episode started more than 1 year ago. The problem is controlled. Recent lipid tests were reviewed and are normal. Exacerbating diseases include diabetes. There are no known factors aggravating his hyperlipidemia. Pertinent negatives include no myalgias. Current antihyperlipidemic treatment includes statins. The current treatment provides moderate improvement of lipids. There are no compliance problems.  Risk factors for coronary artery disease include dyslipidemia, diabetes mellitus, hypertension and male sex.   Review of systems  Constitutional: + drastically decreasing body weight,  current Body mass index is 26.98 kg/m. , no fatigue, no subjective hyperthermia, no subjective hypothermia Eyes: no blurry vision, no xerophthalmia ENT: no sore throat, no nodules palpated in throat, no dysphagia/odynophagia, no hoarseness Cardiovascular: no chest pain, no shortness of breath, no palpitations, no leg swelling Respiratory: no cough, no shortness of breath  Gastrointestinal: no nausea/vomiting/diarrhea Musculoskeletal: no muscle/joint aches- has advanced ALS- requires assistance with all ADLs Skin: no rashes, no hyperemia Neurological: no tremors, no numbness, no tingling, no dizziness Psychiatric: no depression, no anxiety   Objective:    BP (!) 145/78   Pulse 69   Ht 5\' 10"  (1.778 m)   Wt 188 lb (85.3 kg)   BMI 26.98 kg/m   Wt Readings from Last 3 Encounters:  04/01/21 188 lb (85.3 kg)  10/28/20 201 lb (91.2 kg)  09/30/20 202 lb (91.6 kg)    BP Readings from Last 3 Encounters:  04/01/21 (!) 145/78  10/28/20 (!) 160/80  09/30/20 (!) 151/82     Physical Exam- Limited  Constitutional:  Body mass index is 26.98 kg/m. , not in acute distress, normal state of  mind Eyes:  EOMI, no exophthalmos Neck: Supple- wearing neck brace Cardiovascular: RRR, no murmurs, rubs, or gallops, no edema Respiratory: Adequate breathing efforts, no crackles, rales, rhonchi, or wheezing Musculoskeletal: no gross deformities, has advanced ALS- requiring assistance with all ADLs Skin:  no rashes, no hyperemia Neurological: no tremor with outstretched hands      CMP     Component Value Date/Time   NA 131 (L) 12/15/2018 0250   K 5.0 12/15/2018 0250   CL 103 12/15/2018 0250   CO2 20 (L) 12/15/2018 0250   GLUCOSE 313 (H) 12/15/2018 0250   BUN 22 (A) 02/16/2020 0000   CREATININE 1.5 (A) 02/16/2020 0000   CREATININE 1.45 (H) 12/15/2018 0250   CALCIUM 9.4 02/16/2020 0000   PROT 7.4 12/07/2018 1457   ALBUMIN 4.0 12/07/2018 1457   AST 24 12/07/2018 1457   ALT 27 12/07/2018 1457   ALKPHOS 86 12/07/2018 1457   BILITOT 0.8 12/07/2018 1457   GFRNONAA 45 02/16/2020 0000   GFRAA 40 02/20/2021 0000   Lipid Panel     Component Value Date/Time   CHOL 135 02/16/2020 0000   TRIG 81 02/20/2021 0000   HDL 39 02/16/2020 0000   LDLCALC 72 02/20/2021 0000   Diabetic Labs (most recent): Lab Results  Component Value Date   HGBA1C 5.9 02/20/2021   HGBA1C 6.0 09/30/2020   HGBA1C 6.6 02/16/2020    Completed labs from 07/24/2015 to be scanned into his records.  Assessment & Plan:   1) Diabetes mellitus with stage 3 chronic kidney disease ([place])  - Patient has currently controlled asymptomatic type 2 DM since  80 years of age.  He presents today, accompanied by his wife, with his meter and logs showing tight fasting glycemic profile.  His previsit A1c was 5.9% on 02/20/21 at his PCP office.  He has frequent nocturnal hypoglycemia.  His ALS has also advanced and he is unable to perform his ADLs, therefore the wife helps him with most tasks, including blood glucose checks.  They are asking about a CGM today.   -His diabetes is complicated by stage 3 CKD ( which is stable  GFR between 43-54).  - He remains at a high risk for more acute and chronic complications of diabetes which include CAD, CVA, CKD, retinopathy, and neuropathy. These are all discussed in detail with the patient.  - Nutritional counseling repeated at each appointment due to patients tendency to fall back in to old habits.  - The patient admits there is a room for improvement in their diet and drink choices. -  Suggestion is made for the patient to avoid simple carbohydrates from their diet including Cakes, Sweet Desserts / Pastries, Ice Cream,  Soda (diet and regular), Sweet Tea, Candies, Chips, Cookies, Sweet Pastries, Store Bought Juices, Alcohol in Excess of 1-2 drinks a day, Artificial Sweeteners, Coffee Creamer, and "Sugar-free" Products. This will help patient to have stable blood glucose profile and potentially avoid unintended weight gain.   - I encouraged the patient to switch to unprocessed or minimally processed complex starch and increased protein intake (animal or plant source), fruits, and vegetables.   - Patient is advised to stick to a routine mealtimes to eat 3 meals a day and avoid unnecessary snacks (to snack only to correct hypoglycemia).  - I have approached patient with the following individualized plan to manage diabetes and patient agrees:   - Given his frequent nocturnal hypoglycemia, he is advised to lower his Xultophy 100/3.6 to 20 units SQ daily with breakfast.    -He is encouraged to continue monitoring blood glucose at least twice daily, before breakfast and before bed, and call the clinic if he has readings less than 70 or greater than 200 for 3 tests in a row.   He is an excellent candidate for CGM device given his dexterity problems, will send in for Dexcom to [facility].   -Patient is not a candidate for metformin, SGLT2 inhibitors due to CKD- renal function is proving with a GFR of 47.    2) Lipids/HPL: His most recent lipid panel from 02/20/21 shows controlled LDL  of 72.  He is advised to continue Atorvastatin 40 mg po daily at bedtime.  Side effects and precautions discussed with him.  3) Hypertension: His blood pressure is controlled to target.  He is advised to continue Lasix 40 mg po daily, Hydralazine 75 mg po twice daily, Imdur 30 mg po daily, Lisinopril 20 mg po daily, and Metoprolol 50 mg po twice daily.  4) Weight management: His Body mass index is 26.98 kg/m.--- not a candidate for major weight loss. Exercise and carbs management discussed in detail with him.  - I advised patient to maintain close follow up with his PCP for primary care needs.      I spent 30 minutes in the care of the patient today including review of labs from [facility], Lipids, Thyroid Function, Hematology (current and previous including abstractions from other facilities); face-to-face time discussing  his blood glucose readings/logs, discussing hypoglycemia and hyperglycemia episodes and symptoms, medications doses, his options of short and long term treatment based on the latest standards of care / guidelines;  discussion about incorporating lifestyle medicine;  and documenting the encounter.    Please refer to Patient Instructions for Blood Glucose Monitoring and Insulin/Medications Dosing Guide"  in media tab for additional information. Please  also refer to " Patient Self Inventory" in the Media  tab for reviewed elements of pertinent patient history.  John Bishop participated in the discussions, expressed understanding, and voiced agreement with the above plans.  All questions were answered to his satisfaction. he is encouraged to contact clinic should he have any questions or concerns prior to his return visit.    Follow up plan: - Return in about 6 months (around 09/30/2021) for Diabetes F/U with A1c in office, No previsit labs, Bring meter and logs.  Rayetta Pigg, [facility] [address] [place], [place] [address] Phone:  [phone] Fax: [phone]  04/01/2021, 11:24 AM

## 2021-04-02 DIAGNOSIS — K2971 Gastritis, unspecified, with bleeding: Secondary | ICD-10-CM | POA: Diagnosis not present

## 2021-04-07 DIAGNOSIS — N183 Chronic kidney disease, stage 3 unspecified: Secondary | ICD-10-CM | POA: Diagnosis not present

## 2021-04-07 DIAGNOSIS — I129 Hypertensive chronic kidney disease with stage 1 through stage 4 chronic kidney disease, or unspecified chronic kidney disease: Secondary | ICD-10-CM | POA: Diagnosis not present

## 2021-04-07 DIAGNOSIS — Z87891 Personal history of nicotine dependence: Secondary | ICD-10-CM | POA: Diagnosis not present

## 2021-04-07 DIAGNOSIS — I25721 Atherosclerosis of autologous artery coronary artery bypass graft(s) with angina pectoris with documented spasm: Secondary | ICD-10-CM | POA: Diagnosis not present

## 2021-04-07 DIAGNOSIS — E1122 Type 2 diabetes mellitus with diabetic chronic kidney disease: Secondary | ICD-10-CM | POA: Diagnosis not present

## 2021-04-08 ENCOUNTER — Ambulatory Visit (INDEPENDENT_AMBULATORY_CARE_PROVIDER_SITE_OTHER): Payer: Medicare Other | Admitting: Gastroenterology

## 2021-04-08 ENCOUNTER — Encounter: Payer: Self-pay | Admitting: Gastroenterology

## 2021-04-08 ENCOUNTER — Other Ambulatory Visit: Payer: Self-pay

## 2021-04-08 DIAGNOSIS — R6881 Early satiety: Secondary | ICD-10-CM | POA: Diagnosis not present

## 2021-04-08 DIAGNOSIS — R634 Abnormal weight loss: Secondary | ICD-10-CM | POA: Diagnosis not present

## 2021-04-08 MED ORDER — PANTOPRAZOLE SODIUM 40 MG PO TBEC: 40.0000 mg | DELAYED_RELEASE_TABLET | Freq: Every day | ORAL | 3 refills | Status: AC

## 2021-04-08 NOTE — Patient Instructions (Signed)
I am ordering a gastric emptying study to be done soon.  I have sent in Protonix to take once each morning, 30 minutes before breakfast.  We will be in touch once we get results back from the study!  I will see you in 3-4 months!  It was a pleasure to see you today. I want to create trusting relationships with patients to provide genuine, compassionate, and quality care. I value your feedback. If you receive a survey regarding your visit,  I greatly appreciate you taking time to fill this out.   Annitta Needs, PhD, ANP-BC [facility]

## 2021-04-08 NOTE — Progress Notes (Signed)
Primary Care Physician:  Curlene Labrum, MD Referring Physician: Dr. Pleas Koch Primary Gastroenterologist:  Dr. Gala Romney  Chief Complaint  Patient presents with   Bloated   early satiety   Weight Loss    Has lost approx 27 lbs in past year    HPI:   John Bishop is a 80 y.o. male presenting today at the request of Dr. Pleas Koch due to bloating, early satiety, and weight loss. He has a history significant for ALS, diagnosed several years ago. Followed by Neurology at [facility]. MRI liver on file from Aug 2022 with stable small hemangioma of liver. Outside labs from March 2022 with A1c 6.4, Creatinine 1.71, BUN 20, Tbili 0.7, Alk Phos 116, AST 35, ALT 26. TSH 2.530.    Positive H.pylori stool antigen in Sept 2022. Negative stool antigen on file after treatment with amoxicillin and clarithromycin. Got better with this for awhile. No abdominal pain. No nausea. No vomiting. No dysphagia. No GERD symptoms. No overt GI bleeding. Was 212 in 2020. Now 185. Most of this was lost since April. Eating small portions. Feels very full. Has a BM about every day, Bristol stool scale #5.    Colonoscopy: age 37. History of polyps. Last in [place]. Wife, [name] is present.   Anesthesia has stated not a candidate for endoscopic evaluation here at [facility] in light of ALS. Patient is not eager to pursue any endoscopic measures at this time unless absolutely necessary.      Past Medical History:  Diagnosis Date   Arthritis    Diabetes mellitus, type II ([place])    Fatty liver    Hyperlipidemia    Hypertension    Kidney disease    Progressive muscular atrophy ([place]) 04/27/2018    Past Surgical History:  Procedure Laterality Date   COLONOSCOPY W/ POLYPECTOMY     EYE SURGERY Bilateral    catarcts with lens   MUSCLE BIOPSY Left 12/31/2017   Procedure: Left Deltoid Muscle Biopsy;  Surgeon: Ashok Pall, MD;  Location: [facility];  Service: Neurosurgery;  Laterality: Left;  Left Deltoid Muscle  Biopsy   OTHER SURGICAL HISTORY     growths removed R buttock1970, RU arm 2001, and R wrist 2009.   TOTAL HIP ARTHROPLASTY Right 12/14/2018   Procedure: TOTAL HIP ARTHROPLASTY ANTERIOR APPROACH;  Surgeon: Gaynelle Arabian, MD;  Location: [facility];  Service: Orthopedics;  Laterality: Right;  146mn    Current Outpatient Medications  Medication Sig Dispense Refill   Accu-Chek Softclix Lancets lancets Use as instructed to test blood glucose twice daily 200 each 3   aspirin EC 81 MG tablet Take 81 mg by mouth daily.     atorvastatin (LIPITOR) 40 MG tablet TAKE 1 TABLET BY MOUTH EVERY DAY 90 tablet 1   BD PEN NEEDLE NANO 2ND GEN 32G X 4 MM MISC USE AS DIRECTED EVERY MORNING 100 each 5   Cholecalciferol (VITAMIN D3 PO) Take 2,000 mg by mouth 2 (two) times daily.     dorzolamide-timolol (COSOPT) 22.3-6.8 MG/ML ophthalmic solution Place 1 drop into the left eye 2 (two) times daily.     furosemide (LASIX) 20 MG tablet Take 2 tablets (40 mg total) by mouth daily. (MAY TAKE AN ADDITIONAL 20MG TAB AS NEEDED FOR SWELLING) 225 tablet 3   glucose blood (ACCU-CHEK GUIDE) test strip Use as instructed to test blood glucose twice daily 200 each 3   hydrALAZINE (APRESOLINE) 25 MG tablet Take 1 tablet (25 mg total)  by mouth in the morning and at bedtime. (Patient taking differently: Take 25 mg by mouth in the morning and at bedtime. Takes in addition to 55m tablet=754mtwice daily) 180 tablet 1   hydrALAZINE (APRESOLINE) 50 MG tablet TAKE 1.5 TABLETS (75 MG TOTAL) BY MOUTH 2 (TWO) TIMES DAILY. (Patient taking differently: Take 50 mg by mouth 2 (two) times daily. Takes with 2560mablet to=47m33mice daily) 270 tablet 1   Insulin Degludec-Liraglutide (XULTOPHY) 100-3.6 UNIT-MG/ML SOPN Inject 20 Units into the skin daily with breakfast. 15 mL 1   isosorbide mononitrate (IMDUR) 30 MG 24 hr tablet TAKE 1 TABLET BY MOUTH EVERY DAY 90 tablet 2   latanoprost (XALATAN) 0.005 % ophthalmic solution Place 1 drop into the left eye at  bedtime.     lisinopril (ZESTRIL) 20 MG tablet TAKE 1 TABLET BY MOUTH EVERY DAY 90 tablet 0   metoprolol succinate (TOPROL-XL) 50 MG 24 hr tablet TAKE 1 TABLET BY MOUTH 2 (TWO) TIMES DAILY. TAKE WITH OR IMMEDIATELY FOLLOWING A MEAL. 180 tablet 3   Omega-3 Fatty Acids (FISH OIL) 1200 MG CAPS Take by mouth daily.     sodium bicarbonate 650 MG tablet Take 650 mg by mouth 3 (three) times daily.     No current facility-administered medications for this visit.    Allergies as of 04/08/2021   (No Known Allergies)    Family History  Problem Relation Age of Onset   Diabetes Other    Heart disease Other    Hyperlipidemia Other    Hypertension Other    Lung cancer Mother    Hypertension Mother    Diabetes Mother    High Cholesterol Mother    Heart disease Father    Hypertension Father    Diabetes Father    High Cholesterol Father     Social History   Socioeconomic History   Marital status: Married    Spouse name: Not on file   Number of children: Not on file   Years of education: Not on file   Highest education level: Not on file  Occupational History   Not on file  Tobacco Use   Smoking status: Former    Types: Cigarettes    Quit date: 06/07/1986    Years since quitting: 34.8   Smokeless tobacco: Never  Vaping Use   Vaping Use: Never used  Substance and Sexual Activity   Alcohol use: No    Alcohol/week: 0.0 standard drinks    Comment: quit 1974   Drug use: No   Sexual activity: Not on file  Other Topics Concern   Not on file  Social History Narrative   Lives home with wife, SaraJudson Rochducation college.  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  Resp: Denies shortness of breath at rest or with exertion. Denies wheezing or cough.  GI: see HPI GU : Denies urinary burning, urinary frequency, urinary hesitancy MS: see HPI Derm: Denies rash, itching, dry skin Psych: Denies depression, anxiety, memory loss, and confusion Heme: Denies bruising, bleeding, and enlarged lymph nodes.  Physical Exam: BP 118/68  Pulse 76   Temp (!) 96.8 F (36 C) (Temporal)   Ht '5\' 10"'  (1.778 m)   Wt 185 lb 12.8 oz (84.3 kg)   BMI 26.66 kg/m  General:   Alert and oriented. Pleasant and cooperative. Well-nourished and well-developed.  Head:  Normocephalic and atraumatic. Eyes:  Without icterus, sclera clear and conjunctiva pink.  Ears:  Normal auditory acuity. Mouth:  No deformity or lesions, oral mucosa pink.  Lungs:  Clear to auscultation bilaterally.  Heart:  S1, S2 present without murmurs appreciated.  Abdomen:  +BS, soft, non-tender and non-distended. No HSM noted. No guarding or rebound. No masses appreciated.  Rectal:  Deferred  Msk:  Unable to move bilateral upper extremities. Shuffling fait. Neck brace in place for support.  Extremities:  Without edema. Neurologic:  Alert and  oriented x4 Skin:  Intact without significant lesions or rashes. Psych:  Alert and cooperative. Normal mood and affect.  ASSESSMENT/PLAN: HAZEL WRINKLE is a delightful  80 y.o. male presenting today with history of ALS diagnosed several years ago, followed by Neurology at [facility], now with weight loss,bloating, and early satiety. Denies any dysphagia or abdominal pain.   I suspect his symptoms are related to delayed gastric emptying in setting of ALS +/- diabetes, as this can occur as disease progresses. After discussion with anesthesia, he is not a candidate for endoscopic evaluation at [facility] due to risk of intra and post-procedure respiratory issues in setting of ALS. He desires to  avoid endoscopic measures as well if possible.   Will pursue GES as soon as possible. We discussed starting Protonix daily. This was sent to his pharmacy.  If endoscopy is felt warranted, we will need to pursue to in [place]. As of note, colonoscopy on file several years ago in [place] per patient.   Annitta Needs, PhD, ANP-BC [facility]

## 2021-04-09 ENCOUNTER — Telehealth: Payer: Self-pay | Admitting: *Deleted

## 2021-04-09 NOTE — Telephone Encounter (Signed)
GES scheduled for 11/11 at 8:00am, arrival 7:45am, npo midnight, no stomach medications  Called pt and is aware of appt details. He voiced understanding.

## 2021-04-11 ENCOUNTER — Other Ambulatory Visit: Payer: Self-pay | Admitting: "Endocrinology

## 2021-04-18 ENCOUNTER — Ambulatory Visit (HOSPITAL_COMMUNITY)
Admission: RE | Admit: 2021-04-18 | Discharge: 2021-04-18 | Disposition: A | Discharge status: Home / Self Care | Payer: Medicare Other | Source: Ambulatory Visit | Attending: Gastroenterology | Admitting: Gastroenterology

## 2021-04-18 ENCOUNTER — Encounter (HOSPITAL_COMMUNITY): Payer: Self-pay

## 2021-04-18 ENCOUNTER — Other Ambulatory Visit: Payer: Self-pay

## 2021-04-18 DIAGNOSIS — R6881 Early satiety: Secondary | ICD-10-CM | POA: Diagnosis not present

## 2021-04-18 DIAGNOSIS — Z0389 Encounter for observation for other suspected diseases and conditions ruled out: Secondary | ICD-10-CM | POA: Diagnosis not present

## 2021-04-18 DIAGNOSIS — R634 Abnormal weight loss: Secondary | ICD-10-CM | POA: Insufficient documentation

## 2021-04-18 MED ORDER — TECHNETIUM TC 99M SULFUR COLLOID
2.0000 | Freq: Once | INTRAVENOUS | Status: AC | PRN
  Administered 2021-04-18: 2.2 via ORAL

## 2021-05-06 ENCOUNTER — Encounter: Payer: Self-pay | Admitting: Cardiology

## 2021-05-06 ENCOUNTER — Ambulatory Visit (INDEPENDENT_AMBULATORY_CARE_PROVIDER_SITE_OTHER): Payer: Medicare Other | Admitting: Cardiology

## 2021-05-06 VITALS — BP 140/72 | HR 74 | Ht 70.0 in | Wt 184.6 lb

## 2021-05-06 DIAGNOSIS — I10 Essential (primary) hypertension: Secondary | ICD-10-CM | POA: Diagnosis not present

## 2021-05-06 DIAGNOSIS — E782 Mixed hyperlipidemia: Secondary | ICD-10-CM

## 2021-05-06 DIAGNOSIS — R600 Localized edema: Secondary | ICD-10-CM | POA: Diagnosis not present

## 2021-05-06 NOTE — Patient Instructions (Signed)

## 2021-05-06 NOTE — Progress Notes (Signed)
Clinical Summary Mr. Gleaves is a 80 y.o.male seen today for follow up of the following medical problems.    1. HTN - pcp stopped norvasc due to leg swelling   - home bp's 110-120s/60-80s  2. LE edema -norvasc previously stopped - 12/2019 echo LVEF 55-60%, no WMAs, indet DDx   - swelling has improved. Taking lasix 40mg  daily.      3. Chest pain - several year history of chest pain - occurs with exertion only. Tightness mid to left chest, 7/10. No other associated symptoms. Comes on 4 laps around track, can keep going 6-9 laps. Then pain resolves. Overall stable in severity and frequency.    - 12/2015 lexiscan showed small area of mild ischemia inferior wall, LVEF 50%. Low risk study.  - 02/2016 echo LVEF 55-60%, no WMAs, grade I diastolic dysfunction CAD risk factors: DM2, hyperlipidemia, HTN, former tobacco, father MI 52s     -no recent chest pains   4. CKD III - followed at [facility].      5. Hyperlipidemia  08/2020 TC 123 TG 81 HDL 35 LDL 72 - compliant with statin   6. DM2  - followed by endocrinology Dr Dorris Fetch     7. PAD - history of tobacco abuse  - no evidence of aneurysm by 05/2016 [place], did show some PAD. He denies claudication.  - has been on ASA, statin   8. ALS - followed by neuro at [place] - reports some progression in last year, primarily arm weakness R>L   Past Medical History:  Diagnosis Date   ALS (amyotrophic lateral sclerosis) ([place]) 03/2018   Arthritis    Diabetes mellitus, type II ([place])    Fatty liver    Hyperlipidemia    Hypertension    Kidney disease    Progressive muscular atrophy ([place]) 04/27/2018     No Known Allergies   Current Outpatient Medications  Medication Sig Dispense Refill   Accu-Chek Softclix Lancets lancets Use as instructed to test blood glucose twice daily 200 each 3   aspirin EC 81 MG tablet Take 81 mg by mouth daily.     atorvastatin (LIPITOR) 40 MG tablet TAKE 1 TABLET BY MOUTH EVERY DAY 90 tablet 1   BD PEN  NEEDLE NANO 2ND GEN 32G X 4 MM MISC USE AS DIRECTED EVERY MORNING 100 each 5   Cholecalciferol (VITAMIN D3 PO) Take 2,000 mg by mouth 2 (two) times daily.     dorzolamide-timolol (COSOPT) 22.3-6.8 MG/ML ophthalmic solution Place 1 drop into the left eye 2 (two) times daily.     furosemide (LASIX) 20 MG tablet Take 2 tablets (40 mg total) by mouth daily. (MAY TAKE AN ADDITIONAL 20MG  TAB AS NEEDED FOR SWELLING) 225 tablet 3   glucose blood (ACCU-CHEK GUIDE) test strip Use as instructed to test blood glucose twice daily 200 each 3   hydrALAZINE (APRESOLINE) 25 MG tablet Take 1 tablet (25 mg total) by mouth in the morning and at bedtime. (Patient taking differently: Take 25 mg by mouth in the morning and at bedtime. Takes in addition to 50mg  tablet=75mg  twice daily) 180 tablet 1   Insulin Degludec-Liraglutide (XULTOPHY) 100-3.6 UNIT-MG/ML SOPN Inject 20 Units into the skin daily with breakfast. 15 mL 1   isosorbide mononitrate (IMDUR) 30 MG 24 hr tablet TAKE 1 TABLET BY MOUTH EVERY DAY 90 tablet 2   latanoprost (XALATAN) 0.005 % ophthalmic solution Place 1 drop into the left eye at bedtime.     lisinopril (ZESTRIL)  20 MG tablet TAKE 1 TABLET BY MOUTH EVERY DAY 90 tablet 0   metoprolol succinate (TOPROL-XL) 50 MG 24 hr tablet TAKE 1 TABLET BY MOUTH 2 (TWO) TIMES DAILY. TAKE WITH OR IMMEDIATELY FOLLOWING A MEAL. 180 tablet 3   Omega-3 Fatty Acids (FISH OIL) 1200 MG CAPS Take by mouth daily.     pantoprazole (PROTONIX) 40 MG tablet Take 1 tablet (40 mg total) by mouth daily. 30 minutes before breakfast 90 tablet 3   sodium bicarbonate 650 MG tablet Take 650 mg by mouth 3 (three) times daily.     No current facility-administered medications for this visit.     Past Surgical History:  Procedure Laterality Date   COLONOSCOPY W/ POLYPECTOMY     EYE SURGERY Bilateral    catarcts with lens   MUSCLE BIOPSY Left 12/31/2017   Procedure: Left Deltoid Muscle Biopsy;  Surgeon: Ashok Pall, MD;  Location: [facility];  Service: Neurosurgery;  Laterality: Left;  Left Deltoid Muscle Biopsy   OTHER SURGICAL HISTORY     growths removed R buttock1970, RU arm 2001, and R wrist 2009.   TOTAL HIP ARTHROPLASTY Right 12/14/2018   Procedure: TOTAL HIP ARTHROPLASTY ANTERIOR APPROACH;  Surgeon: Gaynelle Arabian, MD;  Location: [facility];  Service: Orthopedics;  Laterality: Right;  138min     No Known Allergies    Family History  Problem Relation Age of Onset   Lung cancer Mother    Hypertension Mother    Diabetes Mother    High Cholesterol Mother    Heart disease Father    Hypertension Father    Diabetes Father    High Cholesterol Father    Diabetes Other    Heart disease Other    Hyperlipidemia Other    Hypertension Other    Colon cancer Neg Hx      Social History Mr. Kolbeck reports that he quit smoking about 34 years ago. His smoking use included cigarettes. He has never used smokeless tobacco. Mr. Holan reports no history of alcohol use.   Review of Systems CONSTITUTIONAL: No weight loss, fever, chills, weakness or fatigue.  HEENT: Eyes: No visual loss, blurred vision, double vision or yellow sclerae.No hearing loss, sneezing, congestion, runny nose or sore throat.  SKIN: No rash or itching.  CARDIOVASCULAR: per hpi RESPIRATORY: No shortness of breath, cough or sputum.  GASTROINTESTINAL: No anorexia, nausea, vomiting or diarrhea. No abdominal pain or blood.  GENITOURINARY: No burning on urination, no polyuria NEUROLOGICAL: No headache, dizziness, syncope, paralysis, ataxia, numbness or tingling in the extremities. No change in bowel or bladder control.  MUSCULOSKELETAL: No muscle, back pain, joint pain or stiffness.  LYMPHATICS: No enlarged nodes. No history of splenectomy.  PSYCHIATRIC: No history of depression or anxiety.  ENDOCRINOLOGIC: No reports of sweating, cold or heat intolerance. No polyuria or polydipsia.  Marland Kitchen   Physical Examination Today's Vitals   05/06/21 0920  BP: 140/72  Pulse:  74  SpO2: 98%  Weight: 184 lb 9.6 oz (83.7 kg)  Height: 5\' 10"  (1.778 m)   Body mass index is 26.49 kg/m.  Gen: resting comfortably, no acute distress HEENT: no scleral icterus, pupils equal round and reactive, no palptable cervical adenopathy,  CV: RRR, no m/rg no jvd Resp: Clear to auscultation bilaterally GI: abdomen is soft, non-tender, non-distended, normal bowel sounds, no hepatosplenomegaly MSK: extremities are warm, no edema.  Skin: warm, no rash Neuro:  no focal deficits Psych: appropriate affect   Diagnostic Studies 12/2019 echo IMPRESSIONS  1. Left ventricular ejection fraction, by estimation, is 55 to 60%. The  left ventricle has normal function. The left ventricle has no regional  wall motion abnormalities. Left ventricular diastolic parameters are  indeterminate.   2. Right ventricular systolic function is normal. The right ventricular  size is normal. There is normal pulmonary artery systolic pressure. The  estimated right ventricular systolic pressure is 21.1 mmHg.   3. The mitral valve is grossly normal. Mild mitral valve regurgitation.   4. The aortic valve is tricuspid, mild annular calcification. Aortic  valve regurgitation is not visualized.   5. The inferior vena cava is normal in size with greater than 50%  respiratory variability, suggesting right atrial pressure of 3 mmHg.     Assessment and Plan   1. HTN - typically elevated in clinic but very detailed home bp's are at goal, which remains the pattern today - continue current meds     2. LE edema - controlled on lasix 40mg  daily, continue   3. Hyperlipidemia - has been at goal, continue atorvastatin. Request most recent labs from pcp   F/u 1 year     Arnoldo Lenis, M.D.

## 2021-05-07 DIAGNOSIS — E1122 Type 2 diabetes mellitus with diabetic chronic kidney disease: Secondary | ICD-10-CM | POA: Diagnosis not present

## 2021-05-07 DIAGNOSIS — Z87891 Personal history of nicotine dependence: Secondary | ICD-10-CM | POA: Diagnosis not present

## 2021-05-07 DIAGNOSIS — N183 Chronic kidney disease, stage 3 unspecified: Secondary | ICD-10-CM | POA: Diagnosis not present

## 2021-05-07 DIAGNOSIS — I129 Hypertensive chronic kidney disease with stage 1 through stage 4 chronic kidney disease, or unspecified chronic kidney disease: Secondary | ICD-10-CM | POA: Diagnosis not present

## 2021-05-07 DIAGNOSIS — I25721 Atherosclerosis of autologous artery coronary artery bypass graft(s) with angina pectoris with documented spasm: Secondary | ICD-10-CM | POA: Diagnosis not present

## 2021-05-08 ENCOUNTER — Encounter: Payer: Self-pay | Admitting: *Deleted

## 2021-05-22 DIAGNOSIS — E559 Vitamin D deficiency, unspecified: Secondary | ICD-10-CM | POA: Diagnosis not present

## 2021-05-22 DIAGNOSIS — D1803 Hemangioma of intra-abdominal structures: Secondary | ICD-10-CM | POA: Diagnosis not present

## 2021-05-22 DIAGNOSIS — K769 Liver disease, unspecified: Secondary | ICD-10-CM | POA: Diagnosis not present

## 2021-05-22 DIAGNOSIS — E1165 Type 2 diabetes mellitus with hyperglycemia: Secondary | ICD-10-CM | POA: Diagnosis not present

## 2021-05-22 DIAGNOSIS — I10 Essential (primary) hypertension: Secondary | ICD-10-CM | POA: Diagnosis not present

## 2021-05-26 ENCOUNTER — Encounter: Payer: Self-pay | Admitting: Nurse Practitioner

## 2021-05-26 ENCOUNTER — Other Ambulatory Visit: Payer: Self-pay | Admitting: "Endocrinology

## 2021-05-26 DIAGNOSIS — N183 Chronic kidney disease, stage 3 unspecified: Secondary | ICD-10-CM

## 2021-05-26 DIAGNOSIS — E1122 Type 2 diabetes mellitus with diabetic chronic kidney disease: Secondary | ICD-10-CM

## 2021-06-04 ENCOUNTER — Other Ambulatory Visit: Payer: Self-pay | Admitting: "Endocrinology

## 2021-06-06 DIAGNOSIS — Z87891 Personal history of nicotine dependence: Secondary | ICD-10-CM | POA: Diagnosis not present

## 2021-06-06 DIAGNOSIS — I25721 Atherosclerosis of autologous artery coronary artery bypass graft(s) with angina pectoris with documented spasm: Secondary | ICD-10-CM | POA: Diagnosis not present

## 2021-06-06 DIAGNOSIS — I129 Hypertensive chronic kidney disease with stage 1 through stage 4 chronic kidney disease, or unspecified chronic kidney disease: Secondary | ICD-10-CM | POA: Diagnosis not present

## 2021-06-06 DIAGNOSIS — E1122 Type 2 diabetes mellitus with diabetic chronic kidney disease: Secondary | ICD-10-CM | POA: Diagnosis not present

## 2021-06-06 DIAGNOSIS — N183 Chronic kidney disease, stage 3 unspecified: Secondary | ICD-10-CM | POA: Diagnosis not present

## 2021-06-13 ENCOUNTER — Ambulatory Visit: Payer: Medicare Other | Admitting: Gastroenterology

## 2021-06-21 ENCOUNTER — Other Ambulatory Visit: Payer: Self-pay | Admitting: Cardiology

## 2021-07-06 DIAGNOSIS — I129 Hypertensive chronic kidney disease with stage 1 through stage 4 chronic kidney disease, or unspecified chronic kidney disease: Secondary | ICD-10-CM | POA: Diagnosis not present

## 2021-07-06 DIAGNOSIS — N183 Chronic kidney disease, stage 3 unspecified: Secondary | ICD-10-CM | POA: Diagnosis not present

## 2021-07-06 DIAGNOSIS — E1122 Type 2 diabetes mellitus with diabetic chronic kidney disease: Secondary | ICD-10-CM | POA: Diagnosis not present

## 2021-07-16 DIAGNOSIS — G1221 Amyotrophic lateral sclerosis: Secondary | ICD-10-CM | POA: Diagnosis not present

## 2021-07-16 DIAGNOSIS — R2689 Other abnormalities of gait and mobility: Secondary | ICD-10-CM | POA: Diagnosis not present

## 2021-07-16 DIAGNOSIS — R29898 Other symptoms and signs involving the musculoskeletal system: Secondary | ICD-10-CM | POA: Diagnosis not present

## 2021-07-16 DIAGNOSIS — Z7409 Other reduced mobility: Secondary | ICD-10-CM | POA: Diagnosis not present

## 2021-07-22 DIAGNOSIS — N183 Chronic kidney disease, stage 3 unspecified: Secondary | ICD-10-CM | POA: Diagnosis not present

## 2021-07-22 DIAGNOSIS — G1221 Amyotrophic lateral sclerosis: Secondary | ICD-10-CM | POA: Diagnosis not present

## 2021-07-22 DIAGNOSIS — N1832 Chronic kidney disease, stage 3b: Secondary | ICD-10-CM | POA: Diagnosis not present

## 2021-07-22 DIAGNOSIS — I129 Hypertensive chronic kidney disease with stage 1 through stage 4 chronic kidney disease, or unspecified chronic kidney disease: Secondary | ICD-10-CM | POA: Diagnosis not present

## 2021-07-22 DIAGNOSIS — Z993 Dependence on wheelchair: Secondary | ICD-10-CM | POA: Diagnosis not present

## 2021-07-22 DIAGNOSIS — E1122 Type 2 diabetes mellitus with diabetic chronic kidney disease: Secondary | ICD-10-CM | POA: Diagnosis not present

## 2021-07-22 DIAGNOSIS — D649 Anemia, unspecified: Secondary | ICD-10-CM | POA: Diagnosis not present

## 2021-07-22 DIAGNOSIS — G1225 Progressive spinal muscle atrophy: Secondary | ICD-10-CM | POA: Diagnosis not present

## 2021-07-23 DIAGNOSIS — R338 Other retention of urine: Secondary | ICD-10-CM | POA: Diagnosis not present

## 2021-07-23 DIAGNOSIS — G1221 Amyotrophic lateral sclerosis: Secondary | ICD-10-CM | POA: Diagnosis not present

## 2021-07-23 DIAGNOSIS — I10 Essential (primary) hypertension: Secondary | ICD-10-CM | POA: Diagnosis not present

## 2021-07-23 DIAGNOSIS — R339 Retention of urine, unspecified: Secondary | ICD-10-CM | POA: Diagnosis not present

## 2021-07-23 DIAGNOSIS — N179 Acute kidney failure, unspecified: Secondary | ICD-10-CM | POA: Diagnosis not present

## 2021-07-23 DIAGNOSIS — R748 Abnormal levels of other serum enzymes: Secondary | ICD-10-CM | POA: Diagnosis not present

## 2021-07-23 DIAGNOSIS — R319 Hematuria, unspecified: Secondary | ICD-10-CM | POA: Diagnosis not present

## 2021-07-23 DIAGNOSIS — R3915 Urgency of urination: Secondary | ICD-10-CM | POA: Diagnosis not present

## 2021-07-23 DIAGNOSIS — E875 Hyperkalemia: Secondary | ICD-10-CM | POA: Diagnosis not present

## 2021-07-23 DIAGNOSIS — D649 Anemia, unspecified: Secondary | ICD-10-CM | POA: Diagnosis not present

## 2021-07-23 DIAGNOSIS — E119 Type 2 diabetes mellitus without complications: Secondary | ICD-10-CM | POA: Diagnosis not present

## 2021-07-23 DIAGNOSIS — R7989 Other specified abnormal findings of blood chemistry: Secondary | ICD-10-CM | POA: Diagnosis not present

## 2021-07-25 DIAGNOSIS — R338 Other retention of urine: Secondary | ICD-10-CM | POA: Diagnosis not present

## 2021-08-05 ENCOUNTER — Other Ambulatory Visit: Payer: Self-pay | Admitting: Cardiology

## 2021-08-05 DIAGNOSIS — N183 Chronic kidney disease, stage 3 unspecified: Secondary | ICD-10-CM | POA: Diagnosis not present

## 2021-08-05 DIAGNOSIS — E1122 Type 2 diabetes mellitus with diabetic chronic kidney disease: Secondary | ICD-10-CM | POA: Diagnosis not present

## 2021-08-05 DIAGNOSIS — I129 Hypertensive chronic kidney disease with stage 1 through stage 4 chronic kidney disease, or unspecified chronic kidney disease: Secondary | ICD-10-CM | POA: Diagnosis not present

## 2021-08-09 ENCOUNTER — Other Ambulatory Visit: Payer: Self-pay | Admitting: Cardiology

## 2021-08-20 DIAGNOSIS — H33322 Round hole, left eye: Secondary | ICD-10-CM | POA: Diagnosis not present

## 2021-08-20 DIAGNOSIS — H401121 Primary open-angle glaucoma, left eye, mild stage: Secondary | ICD-10-CM | POA: Diagnosis not present

## 2021-08-20 DIAGNOSIS — H401131 Primary open-angle glaucoma, bilateral, mild stage: Secondary | ICD-10-CM | POA: Diagnosis not present

## 2021-08-20 DIAGNOSIS — H33002 Unspecified retinal detachment with retinal break, left eye: Secondary | ICD-10-CM | POA: Diagnosis not present

## 2021-08-20 DIAGNOSIS — E1165 Type 2 diabetes mellitus with hyperglycemia: Secondary | ICD-10-CM | POA: Diagnosis not present

## 2021-08-21 ENCOUNTER — Other Ambulatory Visit: Payer: Self-pay | Admitting: "Endocrinology

## 2021-08-22 DIAGNOSIS — N189 Chronic kidney disease, unspecified: Secondary | ICD-10-CM | POA: Diagnosis not present

## 2021-08-22 DIAGNOSIS — E559 Vitamin D deficiency, unspecified: Secondary | ICD-10-CM | POA: Diagnosis not present

## 2021-08-22 DIAGNOSIS — E1165 Type 2 diabetes mellitus with hyperglycemia: Secondary | ICD-10-CM | POA: Diagnosis not present

## 2021-08-22 DIAGNOSIS — N183 Chronic kidney disease, stage 3 unspecified: Secondary | ICD-10-CM | POA: Diagnosis not present

## 2021-08-22 DIAGNOSIS — E7849 Other hyperlipidemia: Secondary | ICD-10-CM | POA: Diagnosis not present

## 2021-08-22 DIAGNOSIS — E782 Mixed hyperlipidemia: Secondary | ICD-10-CM | POA: Diagnosis not present

## 2021-08-22 DIAGNOSIS — Z0001 Encounter for general adult medical examination with abnormal findings: Secondary | ICD-10-CM | POA: Diagnosis not present

## 2021-08-22 LAB — BASIC METABOLIC PANEL
BUN: 44 — AB (ref 4–21)
CO2: 27 — AB (ref 13–22)
Chloride: 103 (ref 99–108)
Creatinine: 1.8 — AB (ref <=1.3)
Glucose: 117
Potassium: 5.1 mEq/L (ref 3.5–5.1)
Sodium: 145 (ref 137–147)

## 2021-08-22 LAB — MICROALBUMIN / CREATININE URINE RATIO: Microalb Creat Ratio: 83

## 2021-08-22 LAB — COMPREHENSIVE METABOLIC PANEL
Albumin: 4.3 (ref 3.5–5.0)
Calcium: 10.3 (ref 8.7–10.7)
Globulin: 2.5

## 2021-08-22 LAB — CBC AND DIFFERENTIAL
HCT: 36 — AB (ref 41–53)
Hemoglobin: 11.6 — AB (ref 13.5–17.5)
Neutrophils Absolute: 4.2
Platelets: 181 10*3/uL (ref 150–400)
WBC: 7.4

## 2021-08-22 LAB — HEPATIC FUNCTION PANEL
ALT: 23 U/L (ref 10–40)
AST: 24 (ref 14–40)
Alkaline Phosphatase: 132 — AB (ref 25–125)
Bilirubin, Total: 1

## 2021-08-22 LAB — HEMOGLOBIN A1C: Hemoglobin A1C: 5.7

## 2021-08-22 LAB — TSH: TSH: 3.37 (ref <=5.90)

## 2021-08-22 LAB — LIPID PANEL
Cholesterol: 120 (ref 0–200)
HDL: 39 (ref 35–70)
LDL Cholesterol: 60
Triglycerides: 114 (ref 40–160)

## 2021-08-22 LAB — CBC: RBC: 4 (ref 3.87–5.11)

## 2021-08-26 ENCOUNTER — Ambulatory Visit: Payer: Medicare Other | Admitting: Gastroenterology

## 2021-08-27 DIAGNOSIS — E1122 Type 2 diabetes mellitus with diabetic chronic kidney disease: Secondary | ICD-10-CM | POA: Diagnosis not present

## 2021-08-27 DIAGNOSIS — R6881 Early satiety: Secondary | ICD-10-CM | POA: Diagnosis not present

## 2021-08-27 DIAGNOSIS — R140 Abdominal distension (gaseous): Secondary | ICD-10-CM | POA: Diagnosis not present

## 2021-08-27 DIAGNOSIS — Z0001 Encounter for general adult medical examination with abnormal findings: Secondary | ICD-10-CM | POA: Diagnosis not present

## 2021-08-27 DIAGNOSIS — I25721 Atherosclerosis of autologous artery coronary artery bypass graft(s) with angina pectoris with documented spasm: Secondary | ICD-10-CM | POA: Diagnosis not present

## 2021-08-27 DIAGNOSIS — E7849 Other hyperlipidemia: Secondary | ICD-10-CM | POA: Diagnosis not present

## 2021-08-27 DIAGNOSIS — G1221 Amyotrophic lateral sclerosis: Secondary | ICD-10-CM | POA: Diagnosis not present

## 2021-08-27 DIAGNOSIS — I10 Essential (primary) hypertension: Secondary | ICD-10-CM | POA: Diagnosis not present

## 2021-09-05 DIAGNOSIS — E1122 Type 2 diabetes mellitus with diabetic chronic kidney disease: Secondary | ICD-10-CM | POA: Diagnosis not present

## 2021-09-05 DIAGNOSIS — I129 Hypertensive chronic kidney disease with stage 1 through stage 4 chronic kidney disease, or unspecified chronic kidney disease: Secondary | ICD-10-CM | POA: Diagnosis not present

## 2021-09-19 ENCOUNTER — Other Ambulatory Visit: Payer: Self-pay | Admitting: Cardiology

## 2021-09-24 DIAGNOSIS — H401121 Primary open-angle glaucoma, left eye, mild stage: Secondary | ICD-10-CM | POA: Diagnosis not present

## 2021-09-24 DIAGNOSIS — H401111 Primary open-angle glaucoma, right eye, mild stage: Secondary | ICD-10-CM | POA: Diagnosis not present

## 2021-09-30 ENCOUNTER — Encounter: Payer: Self-pay | Admitting: Nurse Practitioner

## 2021-09-30 ENCOUNTER — Ambulatory Visit (INDEPENDENT_AMBULATORY_CARE_PROVIDER_SITE_OTHER): Payer: Medicare Other | Admitting: Nurse Practitioner

## 2021-09-30 VITALS — BP 119/68 | HR 65 | Ht 70.0 in | Wt 163.0 lb

## 2021-09-30 DIAGNOSIS — E559 Vitamin D deficiency, unspecified: Secondary | ICD-10-CM

## 2021-09-30 DIAGNOSIS — E1122 Type 2 diabetes mellitus with diabetic chronic kidney disease: Secondary | ICD-10-CM

## 2021-09-30 DIAGNOSIS — N183 Chronic kidney disease, stage 3 unspecified: Secondary | ICD-10-CM

## 2021-09-30 DIAGNOSIS — I10 Essential (primary) hypertension: Secondary | ICD-10-CM | POA: Diagnosis not present

## 2021-09-30 DIAGNOSIS — E782 Mixed hyperlipidemia: Secondary | ICD-10-CM | POA: Diagnosis not present

## 2021-09-30 MED ORDER — XULTOPHY 100-3.6 UNIT-MG/ML ~~LOC~~ SOPN: 10.0000 [IU] | PEN_INJECTOR | Freq: Every day | SUBCUTANEOUS | 1 refills | Status: AC

## 2021-09-30 NOTE — Progress Notes (Signed)
?09/30/2021 ? ?Endocrinology follow-up note ? ? ?Subjective:  ? ? Patient ID: John Bishop, male    DOB: [birth year]. Patient is here for follow up in the management of type 2 diabetes complicated by stage 3-4 renal insufficiency, and hyperlipidemia.   ? ? ?Past Medical History:  ?Diagnosis Date  ? ALS (amyotrophic lateral sclerosis) ([place]) 03/2018  ? Arthritis   ? Diabetes mellitus, type II ([place])   ? Fatty liver   ? Hyperlipidemia   ? Hypertension   ? Kidney disease   ? Progressive muscular atrophy ([place]) 04/27/2018  ? ?Past Surgical History:  ?Procedure Laterality Date  ? COLONOSCOPY W/ POLYPECTOMY    ? EYE SURGERY Bilateral   ? catarcts with lens  ? MUSCLE BIOPSY Left 12/31/2017  ? Procedure: Left Deltoid Muscle Biopsy;  Surgeon: Ashok Pall, MD;  Location: [facility];  Service: Neurosurgery;  Laterality: Left;  Left Deltoid Muscle Biopsy  ? OTHER SURGICAL HISTORY    ? growths removed R buttock1970, RU arm 2001, and R wrist 2009.  ? TOTAL HIP ARTHROPLASTY Right 12/14/2018  ? Procedure: TOTAL HIP ARTHROPLASTY ANTERIOR APPROACH;  Surgeon: Gaynelle Arabian, MD;  Location[facility];  Service: Orthopedics;  Laterality: Right;  135mn  ? ?Social History  ? ?Socioeconomic History  ? Marital status: Married  ?  Spouse name: Not on file  ? Number of children: Not on file  ? Years of education: Not on file  ? Highest education level: Not on file  ?Occupational History  ? Not on file  ?Tobacco Use  ? Smoking status: Former  ?  Types: Cigarettes  ?  Quit date: 06/07/1986  ?  Years since quitting: 35.3  ? Smokeless tobacco: Never  ?Vaping Use  ? Vaping Use: Never used  ?Substance and Sexual Activity  ? Alcohol use: No  ?  Alcohol/week: 0.0 standard drinks  ?  Comment: quit 1974  ? Drug use: No  ? Sexual activity: Not on file  ?Other Topics Concern  ? Not on file  ?Social History Narrative  ? Lives home with wife, SJudson Roch  Education college.  [place]  Retired- MAgricultural engineer  ? ?Social Determinants of Health   ? ?Financial Resource Strain: Not on file  ?Food Insecurity: Not on file  ?Transportation Needs: Not on file  ?Physical Activity: Not on file  ?Stress: Not on file  ?Social Connections: Not on file  ? ?Outpatient Encounter Medications as of 09/30/2021  ?Medication Sig  ? ACCU-CHEK GUIDE test strip USE AS INSTRUCTED TO TEST BLOOD GLUCOSE TWICE DAILY  ? Accu-Chek Softclix Lancets lancets Use as instructed to test blood glucose twice daily  ? aspirin EC 81 MG tablet Take 81 mg by mouth daily.  ? atorvastatin (LIPITOR) 40 MG tablet TAKE 1 TABLET BY MOUTH EVERY DAY  ? BD PEN NEEDLE NANO 2ND GEN 32G X 4 MM MISC USE AS DIRECTED EVERY MORNING  ? Cholecalciferol (VITAMIN D3 PO) Take 2,000 mg by mouth daily.  ? dorzolamide-timolol (COSOPT) 22.3-6.8 MG/ML ophthalmic solution Place 1 drop into the left eye 2 (two) times daily.  ? furosemide (LASIX) 20 MG tablet Take 2 tablets (40 mg total) by mouth daily. (MAY TAKE AN ADDITIONAL '20MG'$  TAB AS NEEDED FOR SWELLING)  ? glycopyrrolate (ROBINUL) 1 MG tablet Take 1 mg by mouth 3 (three) times daily.  ? hydrALAZINE (APRESOLINE) 25 MG tablet TAKE 1 TABLET BY MOUTH EVERY MORNING & AT BEDTIME  ? hydrALAZINE (APRESOLINE) 50 MG tablet Take 50  mg by mouth 2 (two) times daily.  ? isosorbide mononitrate (IMDUR) 30 MG 24 hr tablet TAKE 1 TABLET BY MOUTH EVERY DAY  ? ketorolac (ACULAR) 0.5 % ophthalmic solution Place 1 drop into the left eye 4 (four) times daily.  ? latanoprost (XALATAN) 0.005 % ophthalmic solution Place 1 drop into the left eye at bedtime.  ? lisinopril (ZESTRIL) 20 MG tablet TAKE 1 TABLET BY MOUTH EVERY DAY  ? metoprolol succinate (TOPROL-XL) 50 MG 24 hr tablet TAKE 1 TABLET BY MOUTH 2 (TWO) TIMES DAILY. TAKE WITH OR IMMEDIATELY FOLLOWING A MEAL.  ? Omega-3 Fatty Acids (FISH OIL) 1200 MG CAPS Take 1 capsule by mouth daily.  ? omeprazole (PRILOSEC) 40 MG capsule Take 1 capsule by mouth daily.  ? pantoprazole (PROTONIX) 40 MG tablet Take 1 tablet (40 mg total) by mouth daily. 30  minutes before breakfast  ? RELYVRIO 3-1 g PACK Take by mouth.  ? riluzole (RILUTEK) 50 MG tablet SMARTSIG:1 Tablet(s) By Mouth Every 12 Hours  ? sodium bicarbonate 650 MG tablet Take 650 mg by mouth 3 (three) times daily.  ? [DISCONTINUED] Insulin Degludec-Liraglutide (XULTOPHY) 100-3.6 UNIT-MG/ML SOPN Inject 20 Units into the skin daily with breakfast.  ? Insulin Degludec-Liraglutide (XULTOPHY) 100-3.6 UNIT-MG/ML SOPN Inject 10 Units into the skin daily with breakfast.  ? ?No facility-administered encounter medications on file as of 09/30/2021.  ? ?ALLERGIES: ?No Known Allergies ?VACCINATION STATUS: ? ?There is no immunization history on file for this patient. ? ?Diabetes ?He presents for his follow-up diabetic visit. He has type 2 diabetes mellitus. Onset time: He was diagnosed at approximate age of 26 years. His disease course has been improving. Hypoglycemia symptoms include sweats and tremors. Pertinent negatives for hypoglycemia include no confusion, pallor or seizures. Associated symptoms include weight loss. Pertinent negatives for diabetes include no fatigue, no polydipsia, no polyphagia, no polyuria and no weakness. Hypoglycemia complications include nocturnal hypoglycemia. Symptoms are stable. Diabetic complications include nephropathy. Pertinent negatives for diabetic complications include no heart disease. Risk factors for coronary artery disease include diabetes mellitus, dyslipidemia, male sex, tobacco exposure and hypertension. Current diabetic treatment includes insulin injections. He is compliant with treatment some of the time. His weight is decreasing rapidly. He is following a generally healthy diet. Meal planning includes avoidance of concentrated sweets. He has not had a previous visit with a dietitian. He participates in exercise intermittently. His home blood glucose trend is decreasing steadily. His breakfast blood glucose range is generally 130-140 mg/dl. His overall blood glucose range is  130-140 mg/dl. (He presents today, accompanied by his wife and son, with his CGM and logs showing tight fasting and at goal postprandial glycemic profile.  His previsit A1c on 08/22/21 was 5.7%, improving further from last visit of 5.9%.  He sometimes skips doses of Xultophy depending on his glucose readings.  He has a poor appetite and has dropped some weight.  He does note he drinks ensures several times per day.  Analysis of his CGM shows TIR 82%, TAR 15%, TBR 2%.) An ACE inhibitor/angiotensin II receptor blocker is being taken. He does not see a podiatrist.Eye exam is current.  ?Hyperlipidemia ?This is a chronic problem. The current episode started more than 1 year ago. The problem is controlled. Recent lipid tests were reviewed and are normal. Exacerbating diseases include diabetes. There are no known factors aggravating his hyperlipidemia. Pertinent negatives include no myalgias. Current antihyperlipidemic treatment includes statins. The current treatment provides moderate improvement of lipids. There are no compliance problems.  Risk factors for coronary artery disease include dyslipidemia, diabetes mellitus, hypertension and male sex.  ? ?Review of systems ? ?Constitutional: + drastically decreasing body weight,  current Body mass index is 23.39 kg/m?. , no fatigue, no subjective hyperthermia, no subjective hypothermia ?Eyes: no blurry vision, no xerophthalmia ?ENT: no sore throat, no nodules palpated in throat, no dysphagia/odynophagia, no hoarseness ?Cardiovascular: no chest pain, no shortness of breath, no palpitations, no leg swelling ?Respiratory: no cough, no shortness of breath ?Gastrointestinal: no nausea/vomiting/diarrhea ?Musculoskeletal: no muscle/joint aches- has advanced ALS- requires assistance with all ADLs ?Skin: no rashes, no hyperemia ?Neurological: no tremors, no numbness, no tingling, no dizziness ?Psychiatric: no depression, no anxiety ? ? ?Objective:  ?  ?BP 119/68   Pulse 65   Ht 5'  10" (1.778 m)   Wt 163 lb (73.9 kg) Comment: Patient verified, in wheelchair  SpO2 95%   BMI 23.39 kg/m?   ?Wt Readings from Last 3 Encounters:  ?09/30/21 163 lb (73.9 kg)  ?05/06/21 184 lb 9.6 oz (83.7 kg)

## 2021-09-30 NOTE — Patient Instructions (Signed)
Diabetes Mellitus and Foot Care Foot care is an important part of your health, especially when you have diabetes. Diabetes may cause you to have problems because of poor blood flow (circulation) to your feet and legs, which can cause your skin to: Become thinner and drier. Break more easily. Heal more slowly. Peel and crack. You may also have nerve damage (neuropathy) in your legs and feet, causing decreased feeling in them. This means that you may not notice minor injuries to your feet that could lead to more serious problems. Noticing and addressing any potential problems early is the best way to prevent future foot problems. How to care for your feet Foot hygiene  Wash your feet daily with warm water and mild soap. Do not use hot water. Then, pat your feet and the areas between your toes until they are completely dry. Do not soak your feet as this can dry your skin. Trim your toenails straight across. Do not dig under them or around the cuticle. File the edges of your nails with an emery board or nail file. Apply a moisturizing lotion or petroleum jelly to the skin on your feet and to dry, brittle toenails. Use lotion that does not contain alcohol and is unscented. Do not apply lotion between your toes. Shoes and socks Wear clean socks or stockings every day. Make sure they are not too tight. Do not wear knee-high stockings since they may decrease blood flow to your legs. Wear shoes that fit properly and have enough cushioning. Always look in your shoes before you put them on to be sure there are no objects inside. To break in new shoes, wear them for just a few hours a day. This prevents injuries on your feet. Wounds, scrapes, corns, and calluses  Check your feet daily for blisters, cuts, bruises, sores, and redness. If you cannot see the bottom of your feet, use a mirror or ask someone for help. Do not cut corns or calluses or try to remove them with medicine. If you find a minor scrape,  cut, or break in the skin on your feet, keep it and the skin around it clean and dry. You may clean these areas with mild soap and water. Do not clean the area with peroxide, alcohol, or iodine. If you have a wound, scrape, corn, or callus on your foot, look at it several times a day to make sure it is healing and not infected. Check for: Redness, swelling, or pain. Fluid or blood. Warmth. Pus or a bad smell. General tips Do not cross your legs. This may decrease blood flow to your feet. Do not use heating pads or hot water bottles on your feet. They may burn your skin. If you have lost feeling in your feet or legs, you may not know this is happening until it is too late. Protect your feet from hot and cold by wearing shoes, such as at the beach or on hot pavement. Schedule a complete foot exam at least once a year (annually) or more often if you have foot problems. Report any cuts, sores, or bruises to your health care provider immediately. Where to find more information American Diabetes Association: www.diabetes.org Association of Diabetes Care & Education Specialists: www.diabeteseducator.org Contact a health care provider if: You have a medical condition that increases your risk of infection and you have any cuts, sores, or bruises on your feet. You have an injury that is not healing. You have redness on your legs or feet. You   feel burning or tingling in your legs or feet. You have pain or cramps in your legs and feet. Your legs or feet are numb. Your feet always feel cold. You have pain around any toenails. Get help right away if: You have a wound, scrape, corn, or callus on your foot and: You have pain, swelling, or redness that gets worse. You have fluid or blood coming from the wound, scrape, corn, or callus. Your wound, scrape, corn, or callus feels warm to the touch. You have pus or a bad smell coming from the wound, scrape, corn, or callus. You have a fever. You have a red  line going up your leg. Summary Check your feet every day for blisters, cuts, bruises, sores, and redness. Apply a moisturizing lotion or petroleum jelly to the skin on your feet and to dry, brittle toenails. Wear shoes that fit properly and have enough cushioning. If you have foot problems, report any cuts, sores, or bruises to your health care provider immediately. Schedule a complete foot exam at least once a year (annually) or more often if you have foot problems. This information is not intended to replace advice given to you by your health care provider. Make sure you discuss any questions you have with your health care provider. Document Revised: 12/14/2019 Document Reviewed: 12/14/2019 Elsevier Patient Education  2023 [employer]  

## 2021-10-01 DIAGNOSIS — H401111 Primary open-angle glaucoma, right eye, mild stage: Secondary | ICD-10-CM | POA: Diagnosis not present

## 2021-10-01 DIAGNOSIS — H401121 Primary open-angle glaucoma, left eye, mild stage: Secondary | ICD-10-CM | POA: Diagnosis not present

## 2021-10-15 DIAGNOSIS — R0602 Shortness of breath: Secondary | ICD-10-CM | POA: Diagnosis not present

## 2021-10-15 DIAGNOSIS — R292 Abnormal reflex: Secondary | ICD-10-CM | POA: Diagnosis not present

## 2021-10-15 DIAGNOSIS — G1221 Amyotrophic lateral sclerosis: Secondary | ICD-10-CM | POA: Diagnosis not present

## 2021-10-15 DIAGNOSIS — R29898 Other symptoms and signs involving the musculoskeletal system: Secondary | ICD-10-CM | POA: Diagnosis not present

## 2021-10-17 DIAGNOSIS — H409 Unspecified glaucoma: Secondary | ICD-10-CM | POA: Diagnosis not present

## 2021-10-17 DIAGNOSIS — G1221 Amyotrophic lateral sclerosis: Secondary | ICD-10-CM | POA: Diagnosis not present

## 2021-10-17 DIAGNOSIS — N401 Enlarged prostate with lower urinary tract symptoms: Secondary | ICD-10-CM | POA: Diagnosis not present

## 2021-10-17 DIAGNOSIS — I10 Essential (primary) hypertension: Secondary | ICD-10-CM | POA: Diagnosis not present

## 2021-10-17 DIAGNOSIS — N183 Chronic kidney disease, stage 3 unspecified: Secondary | ICD-10-CM | POA: Diagnosis not present

## 2021-10-17 DIAGNOSIS — K219 Gastro-esophageal reflux disease without esophagitis: Secondary | ICD-10-CM | POA: Diagnosis not present

## 2021-10-17 DIAGNOSIS — E1122 Type 2 diabetes mellitus with diabetic chronic kidney disease: Secondary | ICD-10-CM | POA: Diagnosis not present

## 2021-10-19 DIAGNOSIS — K219 Gastro-esophageal reflux disease without esophagitis: Secondary | ICD-10-CM | POA: Diagnosis not present

## 2021-10-19 DIAGNOSIS — N183 Chronic kidney disease, stage 3 unspecified: Secondary | ICD-10-CM | POA: Diagnosis not present

## 2021-10-19 DIAGNOSIS — I10 Essential (primary) hypertension: Secondary | ICD-10-CM | POA: Diagnosis not present

## 2021-10-19 DIAGNOSIS — N401 Enlarged prostate with lower urinary tract symptoms: Secondary | ICD-10-CM | POA: Diagnosis not present

## 2021-10-19 DIAGNOSIS — G1221 Amyotrophic lateral sclerosis: Secondary | ICD-10-CM | POA: Diagnosis not present

## 2021-10-19 DIAGNOSIS — E1122 Type 2 diabetes mellitus with diabetic chronic kidney disease: Secondary | ICD-10-CM | POA: Diagnosis not present

## 2021-10-20 DIAGNOSIS — G1221 Amyotrophic lateral sclerosis: Secondary | ICD-10-CM | POA: Diagnosis not present

## 2021-10-20 DIAGNOSIS — N183 Chronic kidney disease, stage 3 unspecified: Secondary | ICD-10-CM | POA: Diagnosis not present

## 2021-10-20 DIAGNOSIS — E1122 Type 2 diabetes mellitus with diabetic chronic kidney disease: Secondary | ICD-10-CM | POA: Diagnosis not present

## 2021-10-20 DIAGNOSIS — I10 Essential (primary) hypertension: Secondary | ICD-10-CM | POA: Diagnosis not present

## 2021-10-20 DIAGNOSIS — K219 Gastro-esophageal reflux disease without esophagitis: Secondary | ICD-10-CM | POA: Diagnosis not present

## 2021-10-20 DIAGNOSIS — N401 Enlarged prostate with lower urinary tract symptoms: Secondary | ICD-10-CM | POA: Diagnosis not present

## 2021-10-21 DIAGNOSIS — G1221 Amyotrophic lateral sclerosis: Secondary | ICD-10-CM | POA: Diagnosis not present

## 2021-10-21 DIAGNOSIS — K219 Gastro-esophageal reflux disease without esophagitis: Secondary | ICD-10-CM | POA: Diagnosis not present

## 2021-10-21 DIAGNOSIS — I10 Essential (primary) hypertension: Secondary | ICD-10-CM | POA: Diagnosis not present

## 2021-10-21 DIAGNOSIS — N183 Chronic kidney disease, stage 3 unspecified: Secondary | ICD-10-CM | POA: Diagnosis not present

## 2021-10-21 DIAGNOSIS — N401 Enlarged prostate with lower urinary tract symptoms: Secondary | ICD-10-CM | POA: Diagnosis not present

## 2021-10-21 DIAGNOSIS — E1122 Type 2 diabetes mellitus with diabetic chronic kidney disease: Secondary | ICD-10-CM | POA: Diagnosis not present

## 2021-10-22 DIAGNOSIS — N401 Enlarged prostate with lower urinary tract symptoms: Secondary | ICD-10-CM | POA: Diagnosis not present

## 2021-10-22 DIAGNOSIS — I10 Essential (primary) hypertension: Secondary | ICD-10-CM | POA: Diagnosis not present

## 2021-10-22 DIAGNOSIS — E1122 Type 2 diabetes mellitus with diabetic chronic kidney disease: Secondary | ICD-10-CM | POA: Diagnosis not present

## 2021-10-22 DIAGNOSIS — N183 Chronic kidney disease, stage 3 unspecified: Secondary | ICD-10-CM | POA: Diagnosis not present

## 2021-10-22 DIAGNOSIS — G1221 Amyotrophic lateral sclerosis: Secondary | ICD-10-CM | POA: Diagnosis not present

## 2021-10-22 DIAGNOSIS — K219 Gastro-esophageal reflux disease without esophagitis: Secondary | ICD-10-CM | POA: Diagnosis not present

## 2021-10-23 DIAGNOSIS — G1221 Amyotrophic lateral sclerosis: Secondary | ICD-10-CM | POA: Diagnosis not present

## 2021-10-23 DIAGNOSIS — I10 Essential (primary) hypertension: Secondary | ICD-10-CM | POA: Diagnosis not present

## 2021-10-23 DIAGNOSIS — N401 Enlarged prostate with lower urinary tract symptoms: Secondary | ICD-10-CM | POA: Diagnosis not present

## 2021-10-23 DIAGNOSIS — N183 Chronic kidney disease, stage 3 unspecified: Secondary | ICD-10-CM | POA: Diagnosis not present

## 2021-10-23 DIAGNOSIS — K219 Gastro-esophageal reflux disease without esophagitis: Secondary | ICD-10-CM | POA: Diagnosis not present

## 2021-10-23 DIAGNOSIS — E1122 Type 2 diabetes mellitus with diabetic chronic kidney disease: Secondary | ICD-10-CM | POA: Diagnosis not present

## 2021-10-27 DIAGNOSIS — G1221 Amyotrophic lateral sclerosis: Secondary | ICD-10-CM | POA: Diagnosis not present

## 2021-10-27 DIAGNOSIS — N183 Chronic kidney disease, stage 3 unspecified: Secondary | ICD-10-CM | POA: Diagnosis not present

## 2021-10-27 DIAGNOSIS — E1122 Type 2 diabetes mellitus with diabetic chronic kidney disease: Secondary | ICD-10-CM | POA: Diagnosis not present

## 2021-10-27 DIAGNOSIS — N401 Enlarged prostate with lower urinary tract symptoms: Secondary | ICD-10-CM | POA: Diagnosis not present

## 2021-10-27 DIAGNOSIS — I10 Essential (primary) hypertension: Secondary | ICD-10-CM | POA: Diagnosis not present

## 2021-10-27 DIAGNOSIS — K219 Gastro-esophageal reflux disease without esophagitis: Secondary | ICD-10-CM | POA: Diagnosis not present

## 2021-10-28 ENCOUNTER — Ambulatory Visit: Payer: Medicare Other | Admitting: Gastroenterology

## 2021-10-28 DIAGNOSIS — N189 Chronic kidney disease, unspecified: Secondary | ICD-10-CM | POA: Diagnosis not present

## 2021-10-28 DIAGNOSIS — N183 Chronic kidney disease, stage 3 unspecified: Secondary | ICD-10-CM | POA: Diagnosis not present

## 2021-10-28 DIAGNOSIS — R000 Tachycardia, unspecified: Secondary | ICD-10-CM | POA: Diagnosis not present

## 2021-10-28 DIAGNOSIS — E1122 Type 2 diabetes mellitus with diabetic chronic kidney disease: Secondary | ICD-10-CM | POA: Diagnosis not present

## 2021-10-28 DIAGNOSIS — N186 End stage renal disease: Secondary | ICD-10-CM | POA: Diagnosis not present

## 2021-10-28 DIAGNOSIS — K219 Gastro-esophageal reflux disease without esophagitis: Secondary | ICD-10-CM | POA: Diagnosis not present

## 2021-10-28 DIAGNOSIS — R404 Transient alteration of awareness: Secondary | ICD-10-CM | POA: Diagnosis not present

## 2021-10-28 DIAGNOSIS — I10 Essential (primary) hypertension: Secondary | ICD-10-CM | POA: Diagnosis not present

## 2021-10-28 DIAGNOSIS — Z87891 Personal history of nicotine dependence: Secondary | ICD-10-CM | POA: Diagnosis not present

## 2021-10-28 DIAGNOSIS — I469 Cardiac arrest, cause unspecified: Secondary | ICD-10-CM | POA: Diagnosis not present

## 2021-10-28 DIAGNOSIS — R0689 Other abnormalities of breathing: Secondary | ICD-10-CM | POA: Diagnosis not present

## 2021-10-28 DIAGNOSIS — N401 Enlarged prostate with lower urinary tract symptoms: Secondary | ICD-10-CM | POA: Diagnosis not present

## 2021-10-28 DIAGNOSIS — G1221 Amyotrophic lateral sclerosis: Secondary | ICD-10-CM | POA: Diagnosis not present

## 2021-10-28 DIAGNOSIS — R092 Respiratory arrest: Secondary | ICD-10-CM | POA: Diagnosis not present

## 2021-10-28 DIAGNOSIS — R4020 Unspecified coma: Secondary | ICD-10-CM | POA: Diagnosis not present

## 2021-11-06 DEATH — deceased

## 2022-03-31 ENCOUNTER — Ambulatory Visit: Payer: Medicare Other | Admitting: Nurse Practitioner
